# Patient Record
Sex: Female | Born: 1945
Health system: Southern US, Community
[De-identification: ages and names within clinical notes are randomized; demographics above are authoritative.]

## PROBLEM LIST (undated history)

## (undated) DIAGNOSIS — Z1211 Encounter for screening for malignant neoplasm of colon: Secondary | ICD-10-CM

## (undated) DIAGNOSIS — Z87891 Personal history of nicotine dependence: Secondary | ICD-10-CM

## (undated) DIAGNOSIS — K219 Gastro-esophageal reflux disease without esophagitis: Secondary | ICD-10-CM

## (undated) DIAGNOSIS — N63 Unspecified lump in unspecified breast: Secondary | ICD-10-CM

## (undated) DIAGNOSIS — J449 Chronic obstructive pulmonary disease, unspecified: Secondary | ICD-10-CM

## (undated) DIAGNOSIS — K2980 Duodenitis without bleeding: Secondary | ICD-10-CM

## (undated) DIAGNOSIS — K5790 Diverticulosis of intestine, part unspecified, without perforation or abscess without bleeding: Secondary | ICD-10-CM

## (undated) DIAGNOSIS — K635 Polyp of colon: Secondary | ICD-10-CM

## (undated) DIAGNOSIS — H409 Unspecified glaucoma: Secondary | ICD-10-CM

## (undated) DIAGNOSIS — Z1239 Encounter for other screening for malignant neoplasm of breast: Secondary | ICD-10-CM

## (undated) DIAGNOSIS — K639 Disease of intestine, unspecified: Secondary | ICD-10-CM

## (undated) HISTORY — DX: Diverticulosis of intestine, part unspecified, without perforation or abscess without bleeding: K57.90

## (undated) HISTORY — DX: Polyp of colon: K63.5

## (undated) HISTORY — DX: Disease of intestine, unspecified: K63.9

## (undated) HISTORY — DX: Encounter for other screening for malignant neoplasm of breast: Z12.39

## (undated) HISTORY — DX: Encounter for screening for malignant neoplasm of colon: Z12.11

## (undated) HISTORY — DX: Gastro-esophageal reflux disease without esophagitis: K21.9

## (undated) HISTORY — DX: Unspecified lump in unspecified breast: N63.0

## (undated) HISTORY — DX: Chronic obstructive pulmonary disease, unspecified: J44.9

## (undated) HISTORY — DX: Personal history of nicotine dependence: Z87.891

## (undated) HISTORY — DX: Unspecified glaucoma: H40.9

## (undated) HISTORY — DX: Duodenitis without bleeding: K29.80

---

## 2009-03-15 DIAGNOSIS — K635 Polyp of colon: Secondary | ICD-10-CM

## 2009-03-15 HISTORY — DX: Polyp of colon: K63.5

## 2009-08-20 ENCOUNTER — Ambulatory Visit: Payer: Self-pay | Admitting: Gastroenterology

## 2009-08-20 HISTORY — PX: COLONOSCOPY: SHX174

## 2011-06-30 ENCOUNTER — Ambulatory Visit: Payer: Self-pay | Admitting: Gastroenterology

## 2011-08-20 LAB — HM PAP SMEAR: HM Pap smear: POSITIVE

## 2011-08-27 ENCOUNTER — Ambulatory Visit: Payer: Self-pay | Admitting: Internal Medicine

## 2011-09-01 ENCOUNTER — Ambulatory Visit (INDEPENDENT_AMBULATORY_CARE_PROVIDER_SITE_OTHER): Payer: Medicare Other | Admitting: Internal Medicine

## 2011-09-01 ENCOUNTER — Encounter: Payer: Self-pay | Admitting: Internal Medicine

## 2011-09-01 VITALS — BP 142/78 | HR 81 | Temp 98.0°F | Resp 16 | Ht 61.0 in | Wt 136.8 lb

## 2011-09-01 DIAGNOSIS — K5730 Diverticulosis of large intestine without perforation or abscess without bleeding: Secondary | ICD-10-CM

## 2011-09-01 DIAGNOSIS — K5790 Diverticulosis of intestine, part unspecified, without perforation or abscess without bleeding: Secondary | ICD-10-CM

## 2011-09-01 DIAGNOSIS — Z1239 Encounter for other screening for malignant neoplasm of breast: Secondary | ICD-10-CM

## 2011-09-01 DIAGNOSIS — J449 Chronic obstructive pulmonary disease, unspecified: Secondary | ICD-10-CM

## 2011-09-01 DIAGNOSIS — K2990 Gastroduodenitis, unspecified, without bleeding: Secondary | ICD-10-CM

## 2011-09-01 DIAGNOSIS — K2980 Duodenitis without bleeding: Secondary | ICD-10-CM | POA: Insufficient documentation

## 2011-09-01 MED ORDER — DEXLANSOPRAZOLE 60 MG PO CPDR: 60.0000 mg | DELAYED_RELEASE_CAPSULE | Freq: Every day | ORAL | Status: DC

## 2011-09-01 NOTE — Progress Notes (Signed)
Patient ID: Sharon Webster, female   DOB: September 16, 1945, 66 y.o.   MRN: [id number] Patient Active Problem List  Diagnosis  . Gastritis and duodenitis  . Duodenitis  . COPD (chronic obstructive pulmonary disease)  . GERD (gastroesophageal reflux disease)  . Diverticulosis    Subjective:  CC:   Chief Complaint  Patient presents with  . New Patient    HPI:   Sharon Webster a 66 y.o. female who presents New patient.  CC chronic abdominal pain .  Her last PCP was over several years ago, since she retired from [employer].  Prior care at [facility] by Dr. Mikael Spray and Neal Dy .  She sees Dr. Niel Hummer,   For ongoing evaluation of chronic abdominal pain attributed to diverticulitis and Duodenitis by 2011 endoscopy .  She takes nexium and hyoscyamine tid.  Had an UGI /SBFT done recently at [facility] ordered by [facility], and her next appt with him in is in October.  New patient.  CC chronic abdominal pain .  Her last PCP was over several years ago, since she retired from [employer].  Prior care at [facility] by Dr. Mikael Spray and Neal Dy .  She sees Dr. Niel Hummer,   For ongoing evaluation of chronic abdominal pain attributed to diverticulitis and Duodenitis by 2011 endoscopy .  She takes nexium and hyoscyamine tid.  Had an UGI /SBFT done recently at [facility] ordered by [facility], and her next appt with him in is in October.    Past Medical History  Diagnosis Date  . Duodenitis   . COPD (chronic obstructive pulmonary disease)   . GERD (gastroesophageal reflux disease)   . Diverticulosis   . Glaucoma     History reviewed. No pertinent past surgical history.     The following portions of the patient's history were reviewed and updated as appropriate: Allergies, current medications, and problem list.    Review of Systems:  The remainder of the  review of systems was negative except those addressed in the HPI,     History   Social History  . Marital Status: Widowed    Spouse Name: N/A    Number of Children: N/A  . Years  of Education: N/A   Occupational History  . Not on file.   Social History Main Topics  . Smoking status: Former Smoker    Types: Cigarettes    Quit date: 08/31/2001  . Smokeless tobacco: Never Used  . Alcohol Use: 3.0 oz/week    5 Cans of beer per week  . Drug Use: No  . Sexually Active: Not on file   Other Topics Concern  . Not on file   Social History Narrative  . No narrative on file    Objective:  BP 142/78  Pulse 81  Temp 98 F (36.7 C) (Oral)  Resp 16  Ht 5\' 1"  (1.549 m)  Wt 136 lb 12 oz (62.029 kg)  BMI 25.84 kg/m2  SpO2 99%  General appearance: alert, cooperative and appears stated age Ears: normal TM's and external ear canals both ears Throat: lips, mucosa, and tongue normal; teeth and gums normal Neck: no adenopathy, no carotid bruit, supple, symmetrical, trachea midline and thyroid not enlarged, symmetric, no tenderness/mass/nodules Back: symmetric, no curvature. ROM normal. No CVA tenderness. Lungs: clear to auscultation bilaterally Heart: regular rate and rhythm, S1, S2 normal, no murmur, click, rub or gallop Abdomen: soft, non-tender; bowel sounds normal; no masses,  no organomegaly Pulses: 2+ and symmetric Skin: Skin color, texture, turgor normal. No rashes or lesions Lymph  nodes: Cervical, supraclavicular, and axillary nodes normal.  Assessment and Plan:  Gastritis and duodenitis Symptoms improve with trips to the beach but do not resolve. She had a recet GI study , the reults of which are pending.  Will change PPI to DDexilant, reviewed healthy eating habits   COPD (chronic obstructive pulmonary disease) currently asymptomatic ,  She has quit smoking  Diverticulosis Reviewed diet and ways to treat flares.    Updated Medication List Outpatient Encounter Prescriptions as of 09/01/2011  Medication Sig Dispense Refill  . dexlansoprazole (DEXILANT) 60 MG capsule Take 1 capsule (60 mg total) by mouth daily.  30 capsule  0  . esomeprazole  (NEXIUM) 40 MG capsule Take 40 mg by mouth daily before breakfast.      . Hyoscyamine-Phenyltoloxamine (DIGEX NF PO) Take 1 capsule by mouth 3 (three) times daily.      . Simethicone (GAS-X PO) Take by mouth.         Orders Placed This Encounter  Procedures  . MM Digital Screening  . HM COLONOSCOPY    Return in about 1 month (around 10/01/2011).

## 2011-09-01 NOTE — Patient Instructions (Addendum)
We will try dexilant instead of nexium for one month for your gastritis  Return in one month  You may also want to try Lactaid (milk without lactose,  For lactose intolerant people)

## 2011-09-05 ENCOUNTER — Encounter: Payer: Self-pay | Admitting: Internal Medicine

## 2011-09-05 DIAGNOSIS — J449 Chronic obstructive pulmonary disease, unspecified: Secondary | ICD-10-CM | POA: Insufficient documentation

## 2011-09-05 DIAGNOSIS — K219 Gastro-esophageal reflux disease without esophagitis: Secondary | ICD-10-CM | POA: Insufficient documentation

## 2011-09-05 DIAGNOSIS — K5790 Diverticulosis of intestine, part unspecified, without perforation or abscess without bleeding: Secondary | ICD-10-CM | POA: Insufficient documentation

## 2011-09-05 DIAGNOSIS — F410 Panic disorder [episodic paroxysmal anxiety] without agoraphobia: Secondary | ICD-10-CM | POA: Insufficient documentation

## 2011-09-05 DIAGNOSIS — Z8669 Personal history of other diseases of the nervous system and sense organs: Secondary | ICD-10-CM | POA: Insufficient documentation

## 2011-09-05 DIAGNOSIS — H409 Unspecified glaucoma: Secondary | ICD-10-CM | POA: Insufficient documentation

## 2011-09-05 NOTE — Assessment & Plan Note (Signed)
currently asymptomatic ,  She has quit smoking

## 2011-09-05 NOTE — Assessment & Plan Note (Addendum)
Symptoms improve with trips to the beach but do not resolve. She had a recet GI study , the reults of which are pending.  Will change PPI to DDexilant, reviewed healthy eating habits

## 2011-09-05 NOTE — Assessment & Plan Note (Signed)
Reviewed diet and ways to treat flares.

## 2011-10-15 ENCOUNTER — Encounter: Payer: Self-pay | Admitting: Internal Medicine

## 2011-10-15 ENCOUNTER — Ambulatory Visit (INDEPENDENT_AMBULATORY_CARE_PROVIDER_SITE_OTHER): Payer: Medicare Other | Admitting: Internal Medicine

## 2011-10-15 ENCOUNTER — Other Ambulatory Visit (HOSPITAL_COMMUNITY)
Admission: RE | Admit: 2011-10-15 | Discharge: 2011-10-15 | Disposition: A | Discharge status: Home / Self Care | Payer: Medicare Other | Source: Ambulatory Visit | Attending: Internal Medicine | Admitting: Internal Medicine

## 2011-10-15 VITALS — BP 142/80 | HR 92 | Temp 98.5°F | Resp 14 | Ht 61.0 in | Wt 137.0 lb

## 2011-10-15 DIAGNOSIS — R87810 Cervical high risk human papillomavirus (HPV) DNA test positive: Secondary | ICD-10-CM | POA: Insufficient documentation

## 2011-10-15 DIAGNOSIS — Z124 Encounter for screening for malignant neoplasm of cervix: Secondary | ICD-10-CM

## 2011-10-15 DIAGNOSIS — R52 Pain, unspecified: Secondary | ICD-10-CM

## 2011-10-15 DIAGNOSIS — R5381 Other malaise: Secondary | ICD-10-CM

## 2011-10-15 DIAGNOSIS — Z1239 Encounter for other screening for malignant neoplasm of breast: Secondary | ICD-10-CM

## 2011-10-15 DIAGNOSIS — R1031 Right lower quadrant pain: Secondary | ICD-10-CM

## 2011-10-15 DIAGNOSIS — R5383 Other fatigue: Secondary | ICD-10-CM

## 2011-10-15 DIAGNOSIS — E785 Hyperlipidemia, unspecified: Secondary | ICD-10-CM

## 2011-10-15 DIAGNOSIS — Z13220 Encounter for screening for lipoid disorders: Secondary | ICD-10-CM

## 2011-10-15 DIAGNOSIS — K5790 Diverticulosis of intestine, part unspecified, without perforation or abscess without bleeding: Secondary | ICD-10-CM

## 2011-10-15 DIAGNOSIS — K5730 Diverticulosis of large intestine without perforation or abscess without bleeding: Secondary | ICD-10-CM

## 2011-10-15 LAB — C-REACTIVE PROTEIN: CRP: 1 mg/dL (ref 1–20)

## 2011-10-15 LAB — POCT URINALYSIS DIPSTICK
Glucose, UA: NEGATIVE
Ketones, UA: NEGATIVE
Leukocytes, UA: NEGATIVE
Protein, UA: NEGATIVE

## 2011-10-15 LAB — COMPREHENSIVE METABOLIC PANEL
Albumin: 4.4 g/dL (ref 3.5–5.2)
Alkaline Phosphatase: 89 U/L (ref 39–117)
BUN: 15 mg/dL (ref 6–23)
Creatinine, Ser: 0.9 mg/dL (ref 0.4–1.2)
Glucose, Bld: 100 mg/dL — ABNORMAL HIGH (ref 70–99)
Potassium: 3.9 mEq/L (ref 3.5–5.1)
Total Bilirubin: 0.5 mg/dL (ref 0.3–1.2)

## 2011-10-15 LAB — LDL CHOLESTEROL, DIRECT: Direct LDL: 162.8 mg/dL

## 2011-10-15 LAB — LIPID PANEL
Cholesterol: 247 mg/dL — ABNORMAL HIGH (ref 0–200)
HDL: 48.8 mg/dL (ref >39.00)
Triglycerides: 259 mg/dL — ABNORMAL HIGH (ref 0.0–149.0)

## 2011-10-15 LAB — CBC WITH DIFFERENTIAL/PLATELET
Basophils Absolute: 0 10*3/uL (ref 0.0–0.1)
HCT: 37.3 % (ref 36.0–46.0)
Hemoglobin: 12.6 g/dL (ref 12.0–15.0)
Lymphs Abs: 1.6 10*3/uL (ref 0.7–4.0)
MCHC: 33.8 g/dL (ref 30.0–36.0)
MCV: 87.8 fl (ref 78.0–100.0)
Monocytes Relative: 7.9 % (ref 3.0–12.0)
Neutro Abs: 3.6 10*3/uL (ref 1.4–7.7)
RDW: 12.6 % (ref 11.5–14.6)

## 2011-10-15 MED ORDER — ESOMEPRAZOLE MAGNESIUM 40 MG PO CPDR: 40.0000 mg | DELAYED_RELEASE_CAPSULE | Freq: Every day | ORAL | Status: DC

## 2011-10-15 MED ORDER — HYOSCYAMINE-PHENYLTOLOXAMINE 0.0625-15 MG PO CAPS: 1.0000 | ORAL_CAPSULE | Freq: Three times a day (TID) | ORAL | Status: DC | PRN

## 2011-10-15 NOTE — Patient Instructions (Addendum)
I am checking some labs today that will confirm whtehter you have diverticulitis currently.  We will call you with the results.   We will schedule your mammogram

## 2011-10-15 NOTE — Progress Notes (Signed)
Patient ID: Sharon Webster, female   DOB: Oct 12, 1945, 66 y.o.   MRN: [id number] The patient is here for annual Medicare wellness examination and management of other chronic and acute problems.The patient  continues to have Intermittent RLQ pain, which is present for weeks at a time, not accompanied by constipation. But she alternates between normal stools and loose stools.  She has frequent epsiodes of gaseous distension and takes Gas x 3 times daily with  no change in pain . Her pain is Worse at night , and occasionally wakes her up .  She has diverticulosis by colonoscopy report but has had no diverticulitis ever confirmed. She's had no prior CT of the abdomen and pelvis colonoscopy was done in 2011 by Dr. Niel Hummer.    The risk factors are reflected in the social history.  The roster of all physicians providing medical care to patient - is listed in the Snapshot section of the chart.  Activities of daily living:  The patient is 100% independent in all ADLs: dressing, toileting, feeding as well as independent mobility  Home safety : The patient has smoke detectors in the home. They wear seatbelts.  There are no firearms at home. There is no violence in the home.   There is no risks for hepatitis, STDs or HIV. There is no   history of blood transfusion. They have no travel history to infectious disease endemic areas of the world.  The patient has seen their dentist in the last six month. They have seen their eye doctor in the last year. They admit to slight hearing difficulty with regard to whispered voices and some television programs.  They have deferred audiologic testing in the last year.  They do not  have excessive sun exposure. Discussed the need for sun protection: hats, long sleeves and use of sunscreen if there is significant sun exposure.   Diet: the importance of a healthy diet is discussed. They do have a healthy diet.  The benefits of regular aerobic exercise were discussed. She  walks 4 times per week ,  20 minutes.   Depression screen: there are no signs or vegative symptoms of depression- irritability, change in appetite, anhedonia, sadness/tearfullness.  Cognitive assessment: the patient manages all their financial and personal affairs and is actively engaged. They could relate day,date,year and events; recalled 2/3 objects at 3 minutes; performed clock-face test normally.  The following portions of the patient's history were reviewed and updated as appropriate: allergies, current medications, past family history, past medical history,  past surgical history, past social history  and problem list.  Visual acuity was not assessed per patient preference since she has regular follow up with her ophthalmologist. Hearing and body mass index were assessed and reviewed.   During the course of the visit the patient was educated and counseled about appropriate screening and preventive services including : fall prevention , diabetes screening, nutrition counseling, colorectal cancer screening, and recommended immunizations.   Objective:    BP 142/80  Pulse 92  Temp 98.5 F (36.9 C) (Oral)  Resp 14  Ht 5\' 1"  (1.549 m)  Wt 137 lb (62.143 kg)  BMI 25.89 kg/m2  SpO2 96%  General Appearance:    Alert, cooperative, no distress, appears stated age  Head:    Normocephalic, without obvious abnormality, atraumatic  Eyes:    PERRL, conjunctiva/corneas clear, EOM's intact, fundi    benign, both eyes  Ears:    Normal TM's and external ear canals, both ears  Nose:  Nares normal, septum midline, mucosa normal, no drainage    or sinus tenderness  Throat:   Lips, mucosa, and tongue normal; teeth and gums normal  Neck:   Supple, symmetrical, trachea midline, no adenopathy;    thyroid:  no enlargement/tenderness/nodules; no carotid   bruit or JVD  Back:     Symmetric, no curvature, ROM normal, no CVA tenderness  Lungs:     Clear to auscultation bilaterally, respirations unlabored    Chest Wall:    No tenderness or deformity   Heart:    Regular rate and rhythm, S1 and S2 normal, no murmur, rub   or gallop  Breast Exam:    No tenderness, masses, or nipple abnormality  Abdomen:     Soft, non-tender, bowel sounds active all four quadrants,    no masses, no organomegaly  Genitalia:    Normal female without lesion, discharge or tenderness  Rectal:    Normal tone, normal prostate, no masses or tenderness;   guaiac negative stool  Extremities:   Extremities normal, atraumatic, no cyanosis or edema  Pulses:   2+ and symmetric all extremities  Skin:   Skin color, texture, turgor normal, no rashes or lesions  Lymph nodes:   Cervical, supraclavicular, and axillary nodes normal  Neurologic:   CNII-XII intact, normal strength, sensation and reflexes    throughout  .    Assessment:    Healthy female exam.    Plan:     Await pap smear results. Blood tests: CBC with diff, Comprehensive metabolic panel, Lipoproteins and TSH. Mammogram.   Diverticulosis Despite her current report of right lower quadrant pain she has no signs of inflammation on current serologies including CBC sedimentation rate and C-reactive protein are all normal.  Other and unspecified hyperlipidemia Borderline with an LDL of 162. She has no personal risk factors except her age. Will recommend repeat fasting lipids in 6 months after low-cholesterol diet and regular exercise are recommended.   Updated Medication List Outpatient Encounter Prescriptions as of 10/15/2011  Medication Sig Dispense Refill  . esomeprazole (NEXIUM) 40 MG capsule Take 1 capsule (40 mg total) by mouth daily before breakfast.  90 capsule  3  . Simethicone (GAS-X PO) Take by mouth.      . DISCONTD: dexlansoprazole (DEXILANT) 60 MG capsule Take 1 capsule (60 mg total) by mouth daily.  30 capsule  0  . DISCONTD: esomeprazole (NEXIUM) 40 MG capsule Take 40 mg by mouth daily before breakfast.      . DISCONTD: Hyoscyamine-Phenyltoloxamine  (DIGEX NF PO) Take 1 capsule by mouth 3 (three) times daily.      Marland Kitchen Hyoscyamine-Phenyltoloxamine 4.7829-56 MG CAPS Take 1 tablet by mouth 3 (three) times daily as needed.  90 each  3

## 2011-10-17 ENCOUNTER — Encounter: Payer: Self-pay | Admitting: Internal Medicine

## 2011-10-17 DIAGNOSIS — E7849 Other hyperlipidemia: Secondary | ICD-10-CM | POA: Insufficient documentation

## 2011-10-17 NOTE — Assessment & Plan Note (Signed)
Despite her current report of right lower quadrant pain she has no signs of inflammation on current serologies including CBC sedimentation rate and C-reactive protein are all normal.

## 2011-10-17 NOTE — Assessment & Plan Note (Signed)
Borderline with an LDL of 162. She has no personal risk factors except her age. Will recommend repeat fasting lipids in 6 months after low-cholesterol diet and regular exercise are recommended.

## 2011-10-21 ENCOUNTER — Telehealth: Payer: Self-pay | Admitting: Internal Medicine

## 2011-10-21 ENCOUNTER — Ambulatory Visit: Payer: Self-pay | Admitting: Internal Medicine

## 2011-10-21 LAB — COMPREHENSIVE METABOLIC PANEL
Albumin: 4.4 g/dL (ref 3.4–5.0)
Bilirubin,Total: 0.5 mg/dL (ref 0.2–1.0)
Calcium, Total: 9.5 mg/dL (ref 8.5–10.1)
Chloride: 104 mmol/L (ref 98–107)
Co2: 29 mmol/L (ref 21–32)
EGFR (Non-African Amer.): >60
Glucose: 109 mg/dL — ABNORMAL HIGH (ref 65–99)
Osmolality: 282 (ref 275–301)
Potassium: 3.6 mmol/L (ref 3.5–5.1)
SGOT(AST): 23 U/L (ref 15–37)
SGPT (ALT): 27 U/L (ref 12–78)

## 2011-10-21 LAB — SEDIMENTATION RATE: Erythrocyte Sed Rate: 8 mm/hr (ref 0–30)

## 2011-10-21 NOTE — Telephone Encounter (Signed)
Patient notified, appt scheduled for tomorrow am, patient will go to [facility] for films and bloodwork today

## 2011-10-21 NOTE — Telephone Encounter (Signed)
Caller: Jamecia/Mother; PCP: Duncan Dull; CB#: [phone];  Call regarding Having Trouble With Her Stomach- takes Nexium, Gas X and Digex. Wanting To Be Seen Today; Pain in R side near navel- seen in office on 10/15/11 for physical. Seen by Gastroentologist three months and several polyps removed. She has loose stool daily and this morning was dark in color. Took Pepto Bismayl last night at 2200 and was up most of the night with gas pains. Pain less after having bm this morning and passing alot of gas. Sx worse at night and she is very burby and uncomfortable and pain is keeping her awake. Afebrile. Triage and Care advice per Abdominal Pain Protocol and appnt advised within 24 hours for "new onset constant mild or aching lower abdominal pain". No appnt available today- She is wondering if she can be worked into Dr. Melina Schools schedule. PLEASE CALL HER BACK TO LET HER KNOW.

## 2011-10-21 NOTE — Telephone Encounter (Signed)
I do not have an appt today but I would like her to be scheduled for early tomorrow, i would like her to get bloodwork and plain films at [facility].  She should simplify her diet today to clear liquids in the event that she is having diverticulitis and i will decide when I see her tomorrow if she needs antibiotics

## 2011-10-22 ENCOUNTER — Ambulatory Visit (INDEPENDENT_AMBULATORY_CARE_PROVIDER_SITE_OTHER): Payer: Medicare Other | Admitting: Internal Medicine

## 2011-10-22 ENCOUNTER — Encounter: Payer: Self-pay | Admitting: Internal Medicine

## 2011-10-22 ENCOUNTER — Ambulatory Visit: Payer: Self-pay | Admitting: Internal Medicine

## 2011-10-22 ENCOUNTER — Telehealth: Payer: Self-pay | Admitting: Internal Medicine

## 2011-10-22 VITALS — BP 142/88 | HR 88 | Temp 98.4°F | Resp 18 | Wt 132.5 lb

## 2011-10-22 DIAGNOSIS — N83209 Unspecified ovarian cyst, unspecified side: Secondary | ICD-10-CM

## 2011-10-22 DIAGNOSIS — K760 Fatty (change of) liver, not elsewhere classified: Secondary | ICD-10-CM

## 2011-10-22 DIAGNOSIS — K7689 Other specified diseases of liver: Secondary | ICD-10-CM

## 2011-10-22 DIAGNOSIS — R52 Pain, unspecified: Secondary | ICD-10-CM

## 2011-10-22 DIAGNOSIS — R1031 Right lower quadrant pain: Secondary | ICD-10-CM

## 2011-10-22 DIAGNOSIS — N83201 Unspecified ovarian cyst, right side: Secondary | ICD-10-CM

## 2011-10-22 DIAGNOSIS — R1032 Left lower quadrant pain: Secondary | ICD-10-CM

## 2011-10-22 NOTE — Telephone Encounter (Signed)
Results of labs and CT scan given to patient by Dr. Charmaine Downs Firday evening.  Right ovarian cyst noted, Need for pelvic ultrasound discussed.  Will order next week.  Fatty liver also suggested by CT.  Briefly discussed this diagnosis with patient as well.

## 2011-10-22 NOTE — Progress Notes (Signed)
Patient ID: Sharon Webster, female   DOB: [birth year], 66 y.o.   MRN: [id number]   Patient Active Problem List  Diagnosis  . Gastritis and duodenitis  . Duodenitis  . COPD (chronic obstructive pulmonary disease)  . GERD (gastroesophageal reflux disease)  . Diverticulosis  . Other and unspecified hyperlipidemia  . Abdominal pain, acute, right lower quadrant    Subjective:  CC:   Chief Complaint  Patient presents with  . Gas    pain    HPI:   Sharon Webster a 66 y.o. female who presents abdominal pain and bloating attributed to gas . Her symptoms are Chronic and intermittent,  worse at night, accompanied by distention and gas. The pain is the worst in the right lower quadrant. For the last several days has been severe.  She has been having normal bowel movements. No fevers. No dysuria.    she has had no prior CT for evaluationbut  had a normal upper GI  studyin April ordered by [employer].   She has been taking as X before each meal perpetually with no  Appreciable difference. Last colonoscopy was 2 years ago,  Due again in October.     Past Medical History  Diagnosis Date  . Duodenitis   . COPD (chronic obstructive pulmonary disease)   . GERD (gastroesophageal reflux disease)   . Diverticulosis   . Glaucoma     No past surgical history on file.   The following portions of the patient's history were reviewed and updated as appropriate: Allergies, current medications, and problem list.    Review of Systems:   A comprehensive ROS was done and positive for abdominal pain and bloating   The rest was negative.    History   Social History  . Marital Status: Widowed    Spouse Name: N/A    Number of Children: N/A  . Years of Education: N/A   Occupational History  . Not on file.   Social History Main Topics  . Smoking status: Former Smoker    Types: Cigarettes    Quit date: 08/31/2001  . Smokeless tobacco: Never Used  . Alcohol Use: 3.0 oz/week    5 Cans of  beer per week  . Drug Use: No  . Sexually Active: Not on file   Other Topics Concern  . Not on file   Social History Narrative  . No narrative on file    Objective:  BP 142/88  Pulse 88  Temp 98.4 F (36.9 C) (Oral)  Resp 18  Wt 132 lb 8 oz (60.102 kg)  SpO2 95%  General appearance: alert, cooperative and appears stated age Ears: normal TM's and external ear canals both ears Throat: lips, mucosa, and tongue normal; teeth and gums normal Neck: no adenopathy, no carotid bruit, supple, symmetrical, trachea midline and thyroid not enlarged, symmetric, no tenderness/mass/nodules Back: symmetric, no curvature. ROM normal. No CVA tenderness. Lungs: clear to auscultation bilaterally Heart: regular rate and rhythm, S1, S2 normal, no murmur, click, rub or gallop Abdomen: soft, non-tender; bowel sounds normal; no masses,  no organomegaly Pulses: 2+ and symmetric Skin: Skin color, texture, turgor normal. No rashes or lesions Lymph nodes: Cervical, supraclavicular, and axillary nodes normal.  Assessment and Plan:  Abdominal pain, acute, right lower quadrant  plain films did not suggest small bowel obstruction. She was sent for a CT of the abdomen and pelvis with contrast which showed no evidence of diverticulitis, only fatty liver suggested.Marland Kitchen She was also noted to have a small  ovarian cyst on the right and is willing to be followed up with a pelvic ultrasound and gynecology followup  she is postmenopausal.   Ovarian cyst, right Been doing CT of abdomen and pelvis for persistent right lower quadrant pain and bloating. She will need to return for pelvic ultrasound followed by GYN evaluation.  Hepatic steatosis Suggested by liver texture on recent CT scan. She will return to discuss his diagnosis and workup for her.   Updated Medication List Outpatient Encounter Prescriptions as of 10/22/2011  Medication Sig Dispense Refill  . esomeprazole (NEXIUM) 40 MG capsule Take 1 capsule (40 mg  total) by mouth daily before breakfast.  90 capsule  3  . Hyoscyamine-Phenyltoloxamine 9.6045-40 MG CAPS Take 1 tablet by mouth 3 (three) times daily as needed.  90 each  3  . Simethicone (GAS-X PO) Take by mouth.

## 2011-10-22 NOTE — Patient Instructions (Addendum)
We are ordering a CT scan of your abdomen and pelvis to evaluate your colon and your female organs because of your persistent pain and bloating.

## 2011-10-24 DIAGNOSIS — K760 Fatty (change of) liver, not elsewhere classified: Secondary | ICD-10-CM | POA: Insufficient documentation

## 2011-10-24 DIAGNOSIS — N83201 Unspecified ovarian cyst, right side: Secondary | ICD-10-CM | POA: Insufficient documentation

## 2011-10-24 DIAGNOSIS — G8929 Other chronic pain: Secondary | ICD-10-CM | POA: Insufficient documentation

## 2011-10-24 NOTE — Assessment & Plan Note (Signed)
Been doing CT of abdomen and pelvis for persistent right lower quadrant pain and bloating. She will need to return for pelvic ultrasound followed by GYN evaluation.

## 2011-10-24 NOTE — Assessment & Plan Note (Addendum)
plain films did not suggest small bowel obstruction. She was sent for a CT of the abdomen and pelvis with contrast which showed no evidence of diverticulitis, only fatty liver suggested.Marland Kitchen She was also noted to have a small ovarian cyst on the right and is willing to be followed up with a pelvic ultrasound and gynecology followup  she is postmenopausal.

## 2011-10-24 NOTE — Assessment & Plan Note (Signed)
Suggested by liver texture on recent CT scan. She will return to discuss his diagnosis and workup for her.

## 2011-10-26 ENCOUNTER — Ambulatory Visit: Payer: Self-pay | Admitting: Internal Medicine

## 2011-10-27 ENCOUNTER — Telehealth: Payer: Self-pay | Admitting: Internal Medicine

## 2011-10-27 NOTE — Telephone Encounter (Signed)
Patient notified

## 2011-10-27 NOTE — Telephone Encounter (Signed)
Pelvic ultrasound was normal except for  fibroid tumors in the uterus, small benign tumors,  Do not need surgery. No ovarian cyst seen

## 2011-11-01 ENCOUNTER — Encounter: Payer: Self-pay | Admitting: Internal Medicine

## 2011-11-02 ENCOUNTER — Encounter: Payer: Self-pay | Admitting: Internal Medicine

## 2011-11-03 ENCOUNTER — Ambulatory Visit: Payer: Self-pay | Admitting: Internal Medicine

## 2011-11-04 ENCOUNTER — Encounter: Payer: Self-pay | Admitting: Internal Medicine

## 2011-11-11 ENCOUNTER — Ambulatory Visit: Payer: Self-pay | Admitting: Internal Medicine

## 2011-11-11 ENCOUNTER — Telehealth: Payer: Self-pay | Admitting: Internal Medicine

## 2011-11-11 NOTE — Telephone Encounter (Signed)
Her mammogram was inconclusive on the right so she should be getting an ultrasound today. .  I would like her to make appt for breast exam when I come back .

## 2011-11-11 NOTE — Telephone Encounter (Signed)
If she doesn't feel anything in the area they are targeting,  She does not have to come in .  If she is not sure,  I would like to reexamine her. there will be no charge for the visit, but it will have to wait until I return.

## 2011-11-11 NOTE — Telephone Encounter (Signed)
Spoke with patient, she is going for ultrasound today. She wanted me to ask if it is really necessary to come in for another breast exam, she says that you did a breast exam at her last visit on 10-22-11.

## 2011-11-12 NOTE — Telephone Encounter (Signed)
Left message asking patient to return my call.

## 2011-11-12 NOTE — Telephone Encounter (Signed)
Patient stated she does not feel anything in her breast and stated she would keep a check on it and schedule an appt if she does feel anything.

## 2011-11-16 ENCOUNTER — Telehealth: Payer: Self-pay | Admitting: Internal Medicine

## 2011-11-16 DIAGNOSIS — F419 Anxiety disorder, unspecified: Secondary | ICD-10-CM

## 2011-11-16 DIAGNOSIS — N6310 Unspecified lump in the right breast, unspecified quadrant: Secondary | ICD-10-CM | POA: Insufficient documentation

## 2011-11-16 MED ORDER — ALPRAZOLAM 0.25 MG PO TABS: 0.2500 mg | ORAL_TABLET | Freq: Every evening | ORAL | Status: AC | PRN

## 2011-11-16 NOTE — Assessment & Plan Note (Signed)
Seen on mammogram,. Confirmed by ultrasound.  Surgical biopsy in process.

## 2011-11-16 NOTE — Telephone Encounter (Signed)
Patient was advised of the 0.5 cm well circumscribed solid mass seen on  ultrasound of the right breast and will be referred to Donnalee Curry for surgical biopsy.  She has requested that she be contacted on her cell phone  .  She will be out of town for a week starting Sept 21st.  We will try to get her an app prior to Sept 21st  Please have Morrie Sheldon call her in the alprazolam I ordered for patient as well.  thanks

## 2011-11-16 NOTE — Telephone Encounter (Signed)
Patient is scheduled for Sept. 9 at 9:30 patient needs to arrive at 9:00. I have spoken with patient and gave her their number and address and appt. Information.

## 2011-11-23 ENCOUNTER — Encounter: Payer: Self-pay | Admitting: Internal Medicine

## 2011-11-24 ENCOUNTER — Encounter: Payer: Self-pay | Admitting: Internal Medicine

## 2011-12-14 HISTORY — PX: BREAST BIOPSY: SHX20

## 2012-02-24 DIAGNOSIS — H4050X0 Glaucoma secondary to other eye disorders, unspecified eye, stage unspecified: Secondary | ICD-10-CM | POA: Insufficient documentation

## 2012-02-24 DIAGNOSIS — Q132 Other congenital malformations of iris: Secondary | ICD-10-CM | POA: Insufficient documentation

## 2012-02-24 HISTORY — DX: Glaucoma secondary to other eye disorders, unspecified eye, stage unspecified: H40.50X0

## 2012-04-21 ENCOUNTER — Encounter: Payer: Self-pay | Admitting: General Surgery

## 2012-05-17 ENCOUNTER — Encounter: Payer: Self-pay | Admitting: *Deleted

## 2012-06-21 ENCOUNTER — Ambulatory Visit: Payer: Self-pay | Admitting: General Surgery

## 2012-06-22 ENCOUNTER — Encounter: Payer: Self-pay | Admitting: General Surgery

## 2012-06-29 ENCOUNTER — Ambulatory Visit (INDEPENDENT_AMBULATORY_CARE_PROVIDER_SITE_OTHER): Payer: Medicare Other | Admitting: General Surgery

## 2012-06-29 ENCOUNTER — Encounter: Payer: Self-pay | Admitting: General Surgery

## 2012-06-29 ENCOUNTER — Other Ambulatory Visit: Payer: Self-pay | Admitting: *Deleted

## 2012-06-29 VITALS — BP 152/90 | HR 78 | Resp 16 | Ht 61.0 in | Wt 138.0 lb

## 2012-06-29 DIAGNOSIS — N6099 Unspecified benign mammary dysplasia of unspecified breast: Secondary | ICD-10-CM

## 2012-06-29 DIAGNOSIS — R928 Other abnormal and inconclusive findings on diagnostic imaging of breast: Secondary | ICD-10-CM

## 2012-06-29 DIAGNOSIS — D4860 Neoplasm of uncertain behavior of unspecified breast: Secondary | ICD-10-CM

## 2012-06-29 NOTE — Progress Notes (Signed)
Patient ID: Sharon Webster, female   DOB: May 26, 1945, 67 y.o.   MRN: [id number]  Chief Complaint  Patient presents with  . Follow-up    mammogram    HPI Sharon Webster is a 67 y.o. female.  Patient here today for follow up mammogram. Patient had a right breast biopsy 12-14-11 that showed features of atypical ductal hyperplasia. Patient with no new breast complaints, HPI  Past Medical History  Diagnosis Date  . Duodenitis   . COPD (chronic obstructive pulmonary disease)   . GERD (gastroesophageal reflux disease)   . Diverticulosis   . Glaucoma   . Personal history of tobacco use, presenting hazards to health   . Breast screening, unspecified   . Lump or mass in breast   . Special screening for malignant neoplasms, colon   . Bowel trouble     Past Surgical History  Procedure Laterality Date  . Colonoscopy  2010    polyps, Dr. Niel Hummer  . Breast biopsy  12/14/11    The patient underwent a Finesse biopsy of the right breast December 14, 2011. This was completed as her FNA was inconclusive, with atypical ductal cells identified.    Family History  Problem Relation Age of Onset  . Heart disease Mother   . COPD Mother   . Hypertension Mother   . Cancer Father     metastatic prostate CA  . Stroke Sister     hemorrhagic CVA  . Heart disease Sister     restrictive cardiomyopathy    Social History History  Substance Use Topics  . Smoking status: Former Smoker -- 1.00 packs/day for 20 years    Types: Cigarettes    Quit date: 05/15/2001  . Smokeless tobacco: Never Used  . Alcohol Use: 3.6 oz/week    6 Cans of beer per week    No Known Allergies  Current Outpatient Prescriptions  Medication Sig Dispense Refill  . bismuth subsalicylate (PEPTO BISMOL) 262 MG/15ML suspension Take 15 mLs by mouth every 6 (six) hours as needed for indigestion.      Marland Kitchen esomeprazole (NEXIUM) 40 MG capsule Take 1 capsule (40 mg total) by mouth daily before breakfast.  90 capsule  3  .  Simethicone (GAS-X PO) Take by mouth.       No current facility-administered medications for this visit.    Review of Systems Review of Systems  Constitutional: Negative.   Respiratory: Negative.   Cardiovascular: Negative.     Blood pressure 152/90, pulse 78, resp. rate 16, height 5\' 1"  (1.549 m), weight 138 lb (62.596 kg).  Physical Exam Physical Exam  Constitutional: She is oriented to person, place, and time. She appears well-developed and well-nourished.  Cardiovascular: Normal rate and regular rhythm.   Pulmonary/Chest: Effort normal and breath sounds normal. Right breast exhibits no inverted nipple, no mass, no nipple discharge, no skin change and no tenderness. Left breast exhibits no inverted nipple, no mass, no nipple discharge, no skin change and no tenderness.  Lymphadenopathy:    She has no cervical adenopathy.    She has no axillary adenopathy.  Neurological: She is alert and oriented to person, place, and time.  Skin: Skin is warm and dry.    Data Reviewed Recent mammograms were reviewed.  PROCEDURE:     MAM - MAM DGTL UNI MAM RT BREAST W/CAD  - Jun 21 2012  11:46AM  RESULT:  Comparisons: 11/03/2011 and 11/11/2011.  Findings: The breast tissue is heterogeneously dense, which may lower the  sensitivity  of mammography. A biopsy clip marker is present in the medial  aspect of the right breast anterior depth. The mass demonstrated in this  region on prior study has essentially resolved. There may be some minimal  residual density.  BI-RAD-4 as ADH was originally identified.   Assessment    Focus of atypical ductal hyperplasia right breast.     Plan    The patient had been encouraged to have formal excision the time of her original biopsy but she declined. Considering the area is nearly completely been removed, continued observation is reasonable.         [name] 06/30/2012, 9:01 PM

## 2012-06-29 NOTE — Progress Notes (Signed)
Patient to have a bilateral diagnostic mammogram with right breast FSC views in 6 months.

## 2012-06-29 NOTE — Patient Instructions (Addendum)
Continue to do self breast exams.  Call for new questions or concerns.

## 2012-06-30 ENCOUNTER — Encounter: Payer: Self-pay | Admitting: General Surgery

## 2012-06-30 DIAGNOSIS — R928 Other abnormal and inconclusive findings on diagnostic imaging of breast: Secondary | ICD-10-CM | POA: Insufficient documentation

## 2012-12-19 LAB — HM MAMMOGRAPHY

## 2012-12-20 ENCOUNTER — Encounter: Payer: Self-pay | Admitting: General Surgery

## 2012-12-20 ENCOUNTER — Ambulatory Visit: Payer: Self-pay | Admitting: General Surgery

## 2013-01-01 ENCOUNTER — Encounter: Payer: Self-pay | Admitting: General Surgery

## 2013-01-01 ENCOUNTER — Ambulatory Visit (INDEPENDENT_AMBULATORY_CARE_PROVIDER_SITE_OTHER): Payer: Medicare Other | Admitting: General Surgery

## 2013-01-01 VITALS — BP 148/82 | HR 72 | Resp 12 | Ht 61.0 in | Wt 135.0 lb

## 2013-01-01 DIAGNOSIS — R928 Other abnormal and inconclusive findings on diagnostic imaging of breast: Secondary | ICD-10-CM

## 2013-01-01 DIAGNOSIS — N6099 Unspecified benign mammary dysplasia of unspecified breast: Secondary | ICD-10-CM | POA: Insufficient documentation

## 2013-01-01 NOTE — Progress Notes (Signed)
Patient ID: Sharon Webster, female   DOB: 12/12/1945, 67 y.o.   MRN: [id number]  Chief Complaint  Patient presents with  . Follow-up    mammogram    HPI Sharon Webster is a 67 y.o. female who presents for a breast evaluation. The most recent mammogram was done on 12/20/12. Patient does perform regular self breast checks and gets regular mammograms done.    HPI  Past Medical History  Diagnosis Date  . Duodenitis   . COPD (chronic obstructive pulmonary disease)   . GERD (gastroesophageal reflux disease)   . Diverticulosis   . Glaucoma   . Personal history of tobacco use, presenting hazards to health   . Breast screening, unspecified   . Lump or mass in breast   . Special screening for malignant neoplasms, colon   . Bowel trouble     Past Surgical History  Procedure Laterality Date  . Colonoscopy  2010    polyps, Dr. Niel Hummer  . Breast biopsy  12/14/11    The patient underwent a Finesse biopsy of the right breast December 14, 2011. This was completed as her FNA was inconclusive, with atypical ductal cells identified.    Family History  Problem Relation Age of Onset  . Heart disease Mother   . COPD Mother   . Hypertension Mother   . Cancer Father     metastatic prostate CA  . Stroke Sister     hemorrhagic CVA  . Heart disease Sister     restrictive cardiomyopathy    Social History History  Substance Use Topics  . Smoking status: Former Smoker -- 1.00 packs/day for 20 years    Types: Cigarettes    Quit date: 05/15/2001  . Smokeless tobacco: Never Used  . Alcohol Use: 3.6 oz/week    6 Cans of beer per week    No Known Allergies  Current Outpatient Prescriptions  Medication Sig Dispense Refill  . bismuth subsalicylate (PEPTO BISMOL) 262 MG/15ML suspension Take 15 mLs by mouth every 6 (six) hours as needed for indigestion.      Marland Kitchen esomeprazole (NEXIUM) 40 MG capsule Take 1 capsule (40 mg total) by mouth daily before breakfast.  90 capsule  3  . etodolac  (LODINE XL) 500 MG 24 hr tablet Take 500 mg by mouth daily.      . Simethicone (GAS-X PO) Take by mouth.       No current facility-administered medications for this visit.    Review of Systems Review of Systems  Constitutional: Negative.   Respiratory: Negative.   Cardiovascular: Negative.     Blood pressure 148/82, pulse 72, resp. rate 12, height 5\' 1"  (1.549 m), weight 135 lb (61.236 kg).  Physical Exam Physical Exam  Constitutional: She is oriented to person, place, and time. She appears well-developed and well-nourished.  Eyes: No scleral icterus.  Cardiovascular: Normal rate, regular rhythm and normal heart sounds.   Pulmonary/Chest: Breath sounds normal. Right breast exhibits no inverted nipple, no mass, no nipple discharge, no skin change and no tenderness. Left breast exhibits no inverted nipple, no mass, no nipple discharge, no skin change and no tenderness.  Lymphadenopathy:    She has no cervical adenopathy.    She has no axillary adenopathy.  Neurological: She is alert and oriented to person, place, and time.  Skin: Skin is warm and dry.    Data Reviewed Vacuum biopsy of the right breast dated 12/14/2011 showed fragmented intraductal papillary perforation with adjacent distorting sclerosis and features of ADH.  Bilateral mammograms dated 12/20/2012 showed an no suspicious mass, distortion or calcification. The radiologist informed the patient that she should consider excisional biopsy based on the past biopsy results. BI-RAD-4.  Assessment    Focal ADH, patient unwilling to consider local reexcision.    Plan    the patient and I reviewed are October 2013 conversation regarding the indications for reexcision. [facility] been stable for a year. At this time, we'll plan to continue careful mammographic surveillance. A right breast mammogram will be obtained in 6 months.       Sharon Webster 01/02/2013, 7:06 AM

## 2013-01-01 NOTE — Patient Instructions (Addendum)
Patient to return in 6 months right diagnotic mammogram.

## 2013-01-09 ENCOUNTER — Encounter: Payer: Self-pay | Admitting: General Surgery

## 2013-01-20 IMAGING — US US PELV - US TRANSVAGINAL
1 series · 14 of 25 positions shown · non-contrast
Comparison: none

REASON FOR EXAM: Ovarian cysts
COMMENTS:

[Series 1: us pelv - us transvaginal · 0.30mm/px · 100 acquisitions, 14 frames shown]
[im 1/100]
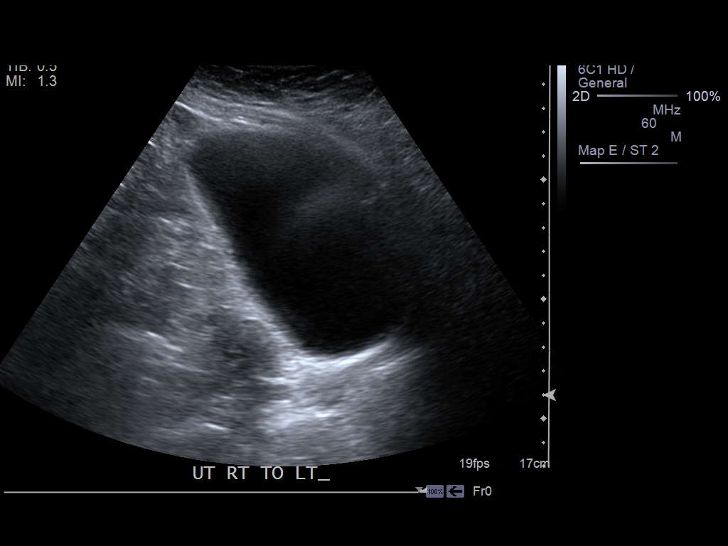
[im 9/100]
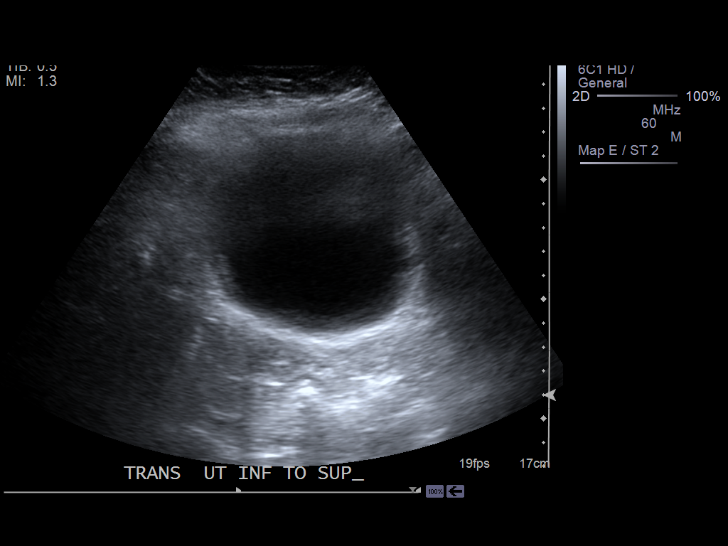
[im 17/100]
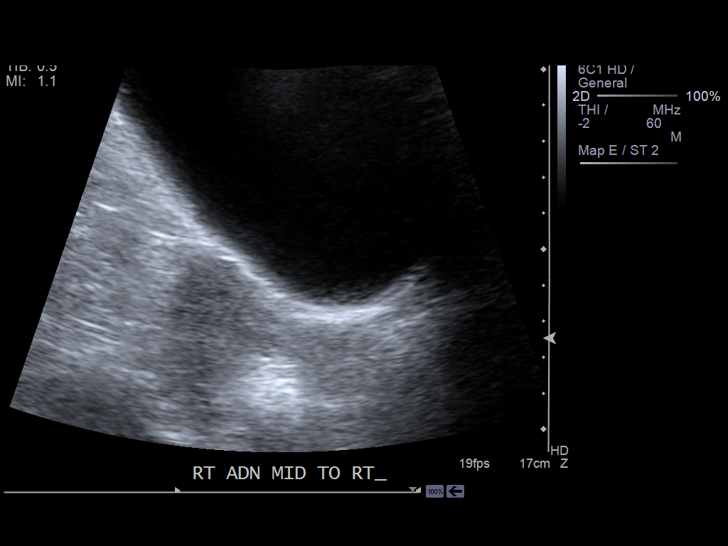
[im 25/100]
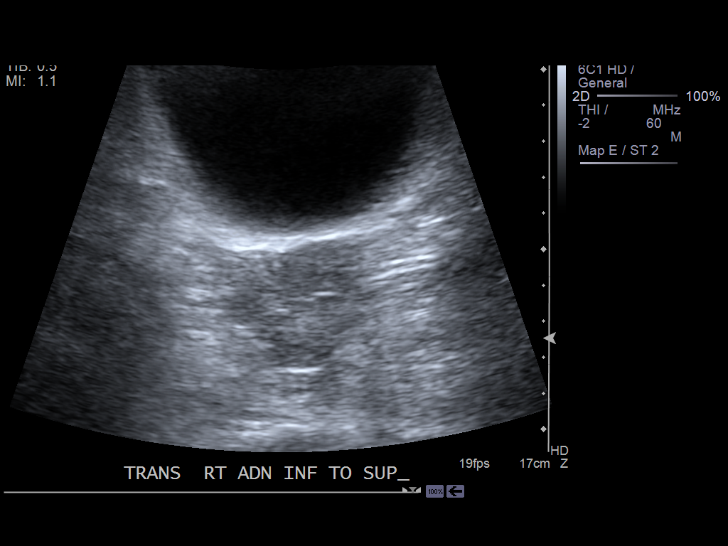
[im 34/100]
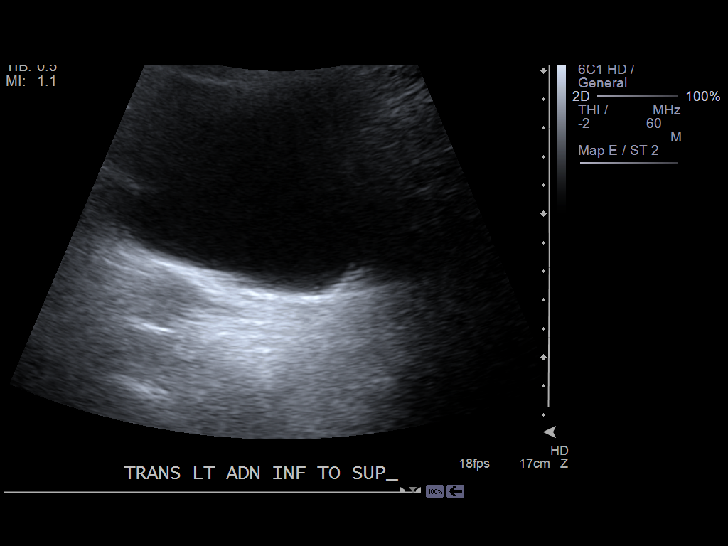
[im 38/100]
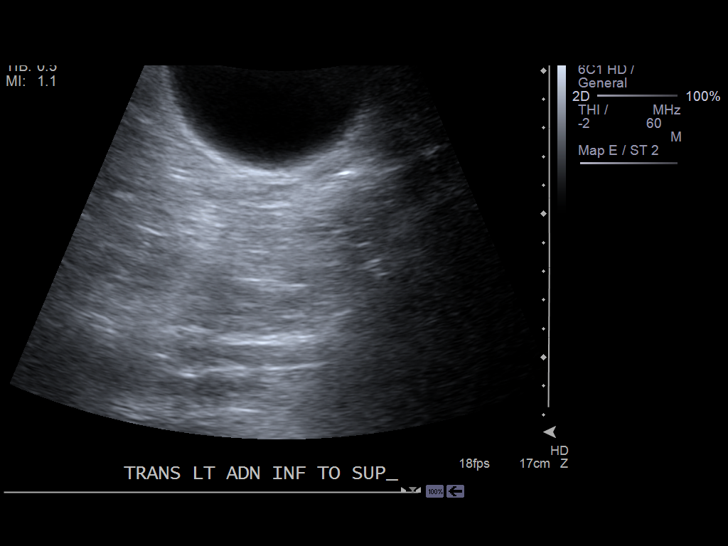
[im 46/100]
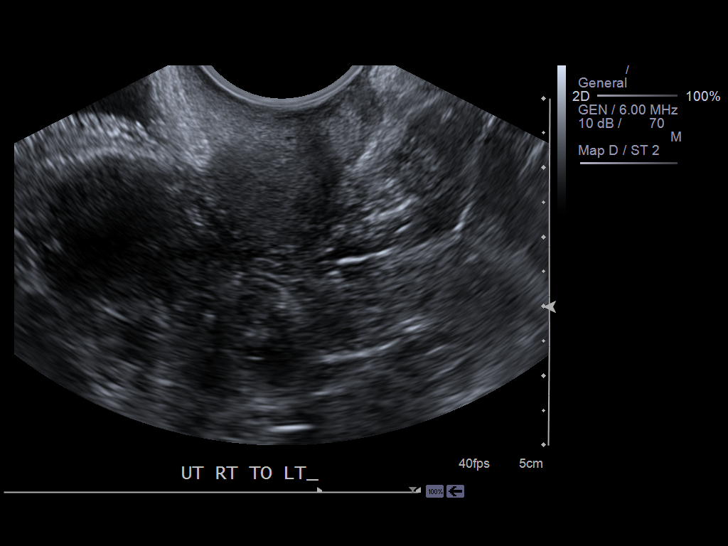
[im 54/100]
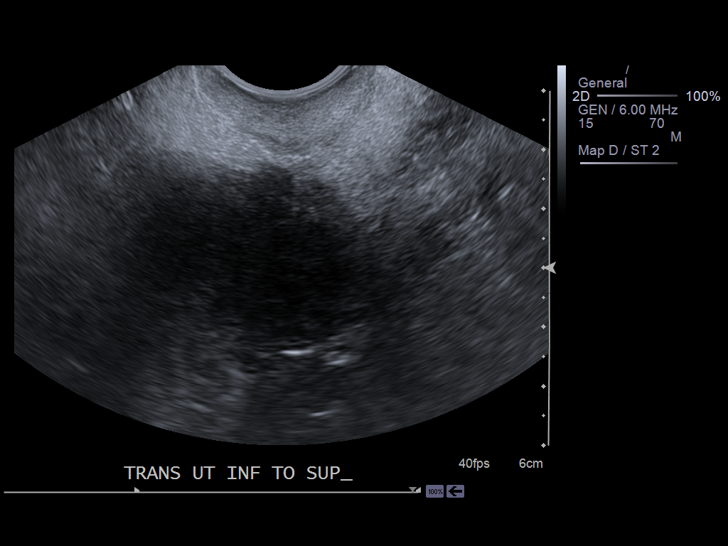
[im 62/100]
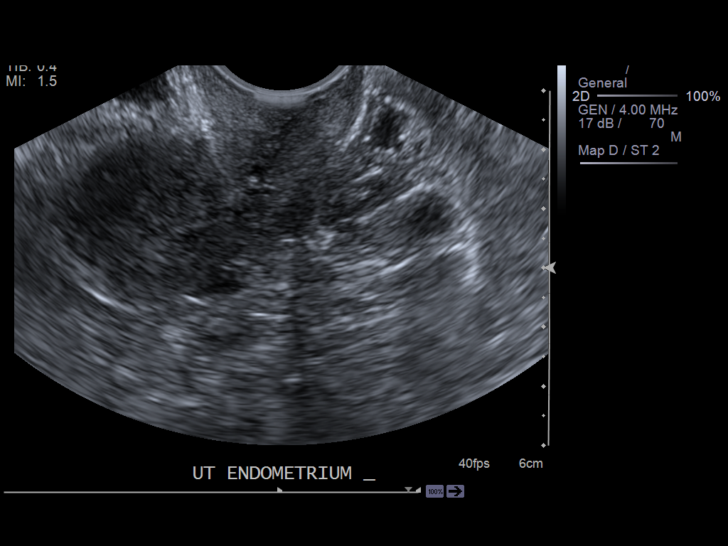
[im 67/100]
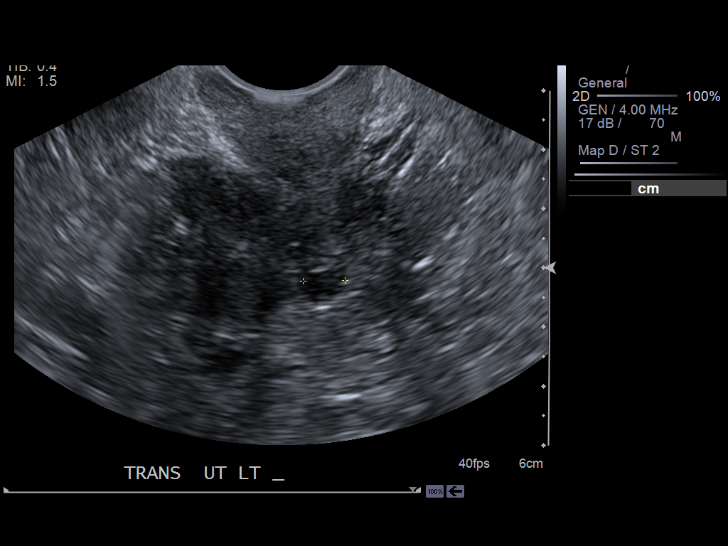
[im 75/100]
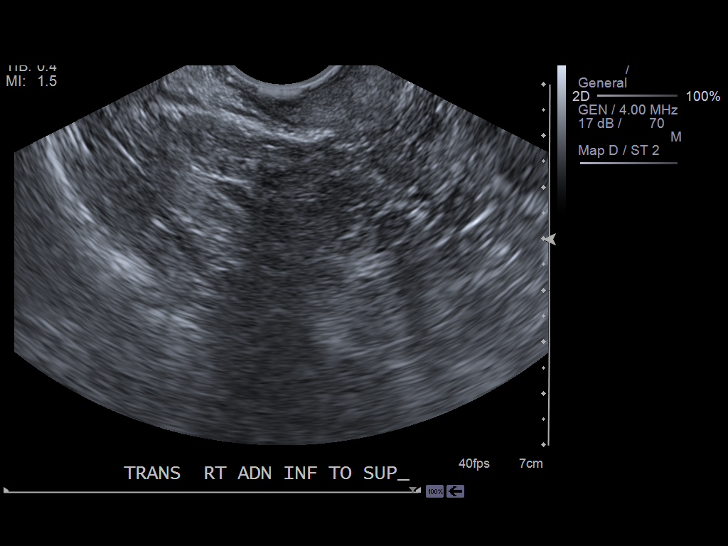
[im 83/100]
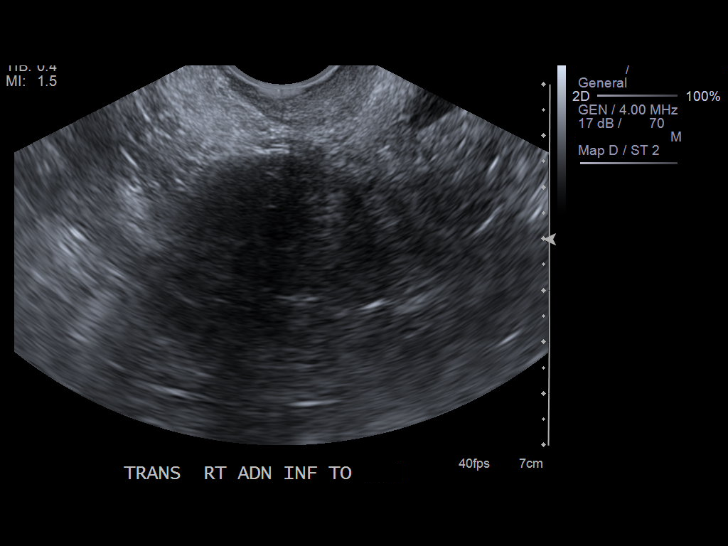
[im 91/100]
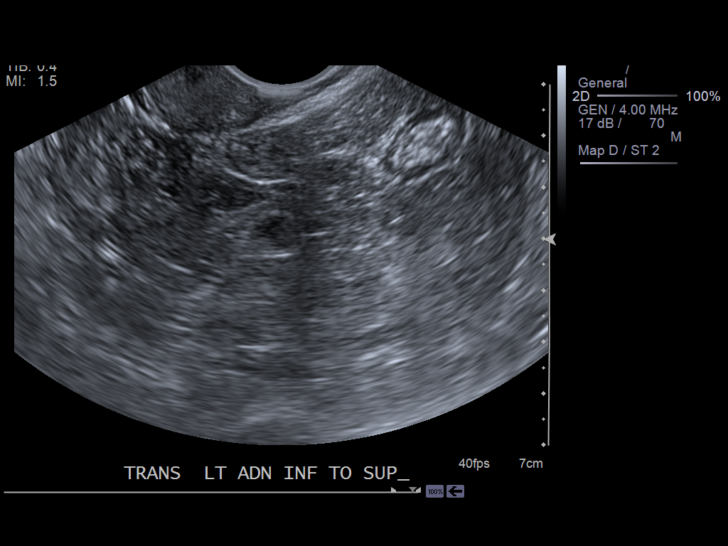
[im 100/100]
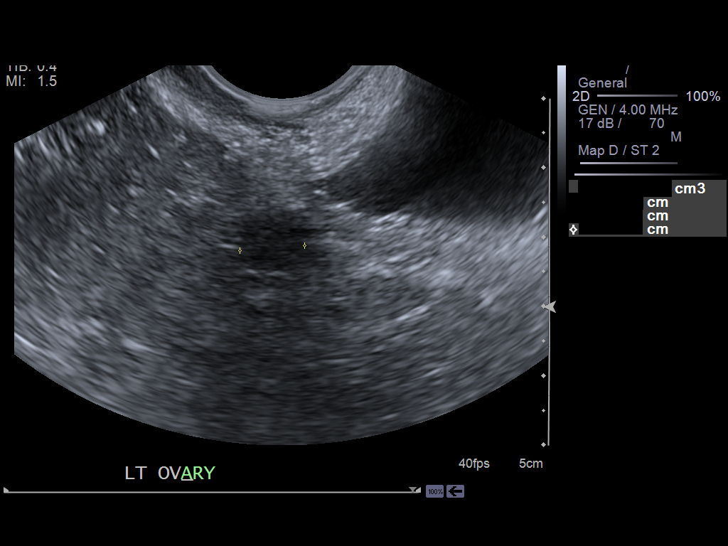

[14 of 25 positions shown; findings below may reference images not displayed]

PROCEDURE:     US  - US PELVIS EXAM W/TRANSVAGINAL  - October 26, 2011  [DATE]

RESULT:     Transabdominal pelvic sonogram and endovaginal pelvic sonogram
is performed. The uterus measures 6.70 x 3.99 x 3.35 cm. The left ovary is
seen and measures 1.23 x 0.68 x 0.94 cm. The right ovary is not visualized.
The uterus contains areas of mixed echotexture with an overall hypoechoic
appearance suggestive of uterine fibroids. The endometrial stripe thickness
is 5.2 mm. The kidneys show no obstruction on survey images. There appears
to be a nabothian cyst in the area the cervix. The largest uterine fibroid
near the fundus measures approximately 1.41 x 1.59 x 1.38 cm. Second fibroid
to the left measures 0.52 x 0.65 x 0.70 cm.
IMPRESSION: 1. Fibroid uterus.
2. Unremarkable appearance of the right ovary. The left ovary is not
visualized.

[REDACTED]

## 2013-02-05 IMAGING — US ULTRASOUND RIGHT BREAST
1 series · 13 of 18 positions shown · non-contrast
Comparison: none

REASON FOR EXAM: av rt asymmetries
COMMENTS:

PROCEDURE:     US  - US BREAST RIGHT  - November 11, 2011  [DATE]
RESULT:
Comparisons: 11/03/2011 and 01/10/2004.

[Series 1: ultrasound right breast · 0.08mm/px · 13 of 18 slices shown]
[im 1/18]
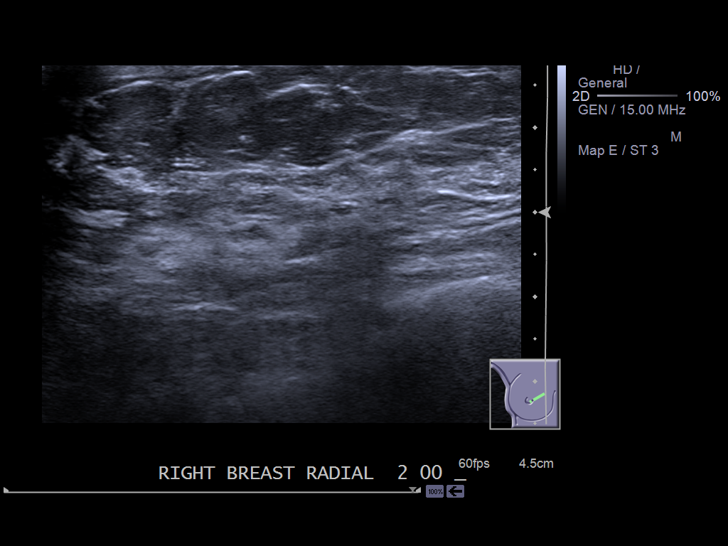
[im 3/18]
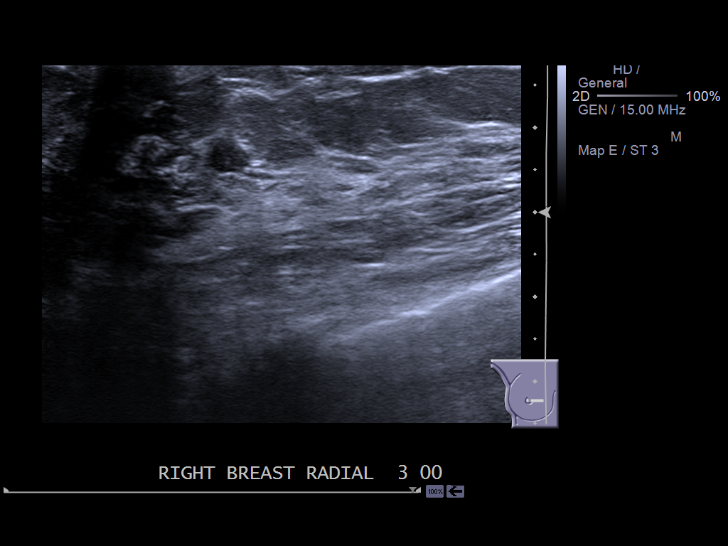
[im 4/18]
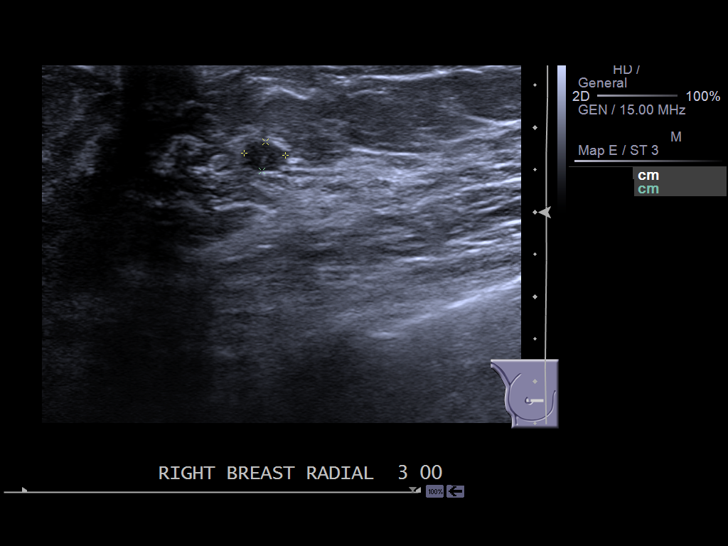
[im 5/18]
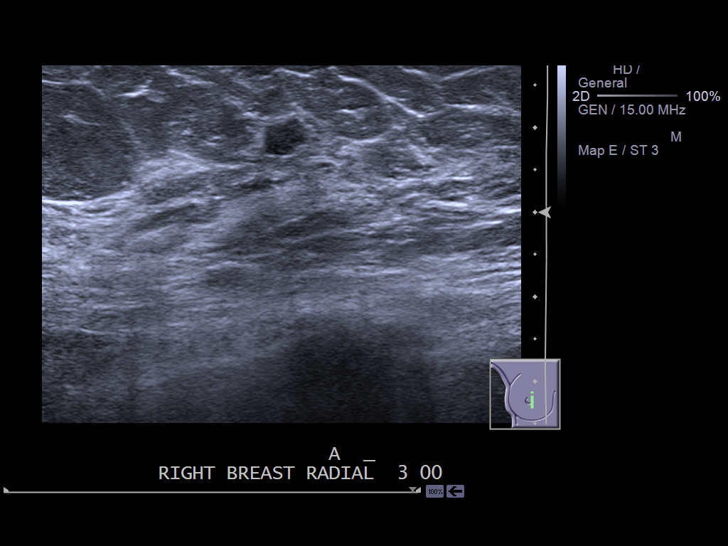
[im 7/18]
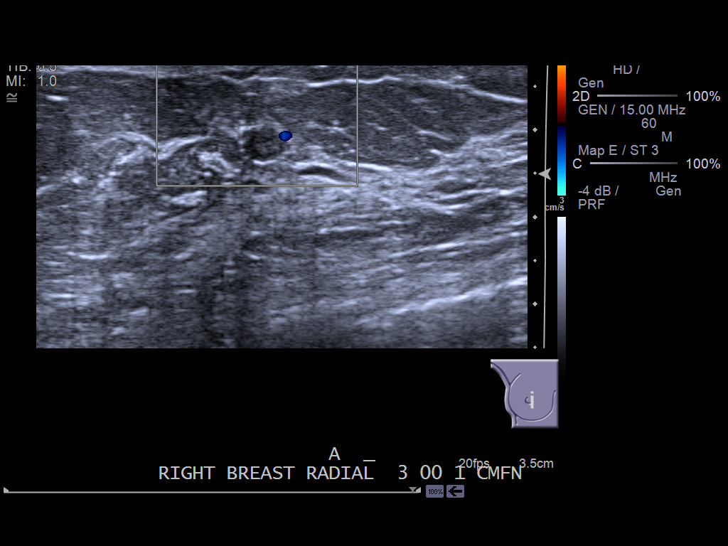
[im 8/18]
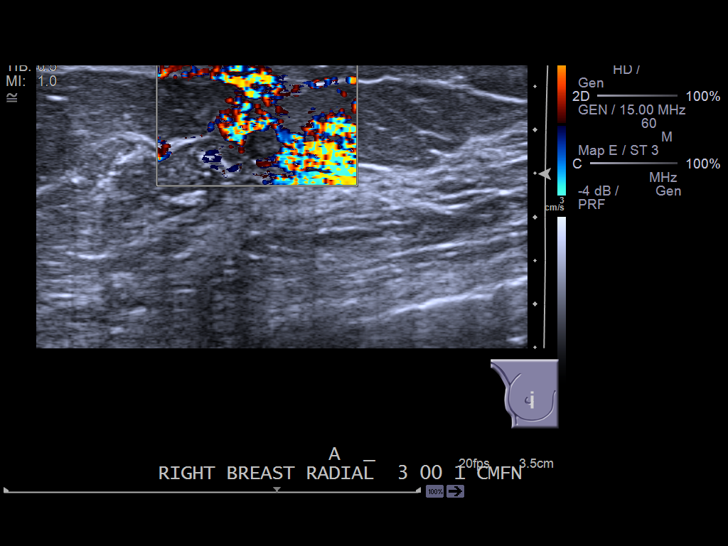
[im 10/18]
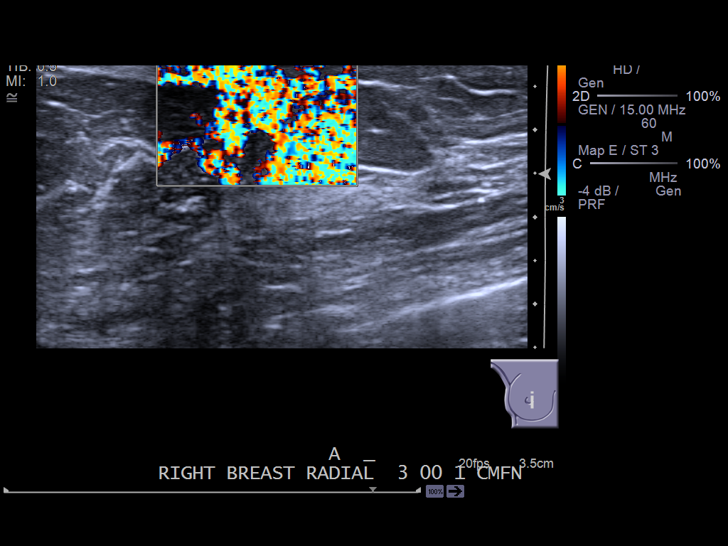
[im 11/18]
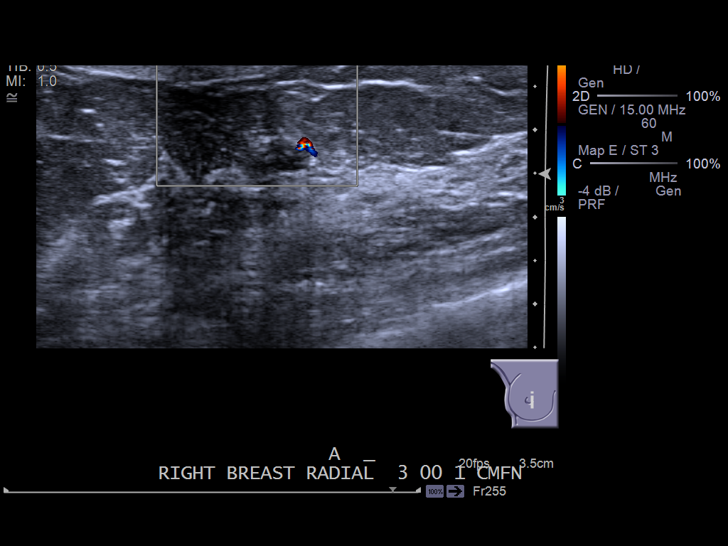
[im 12/18]
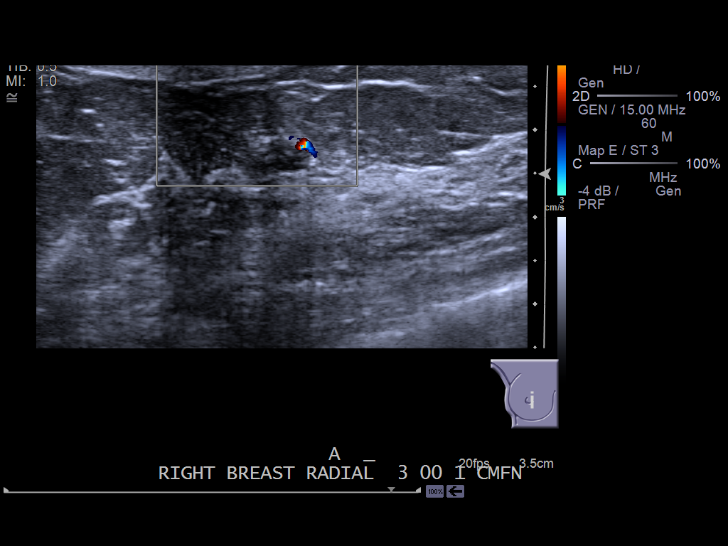
[im 14/18]
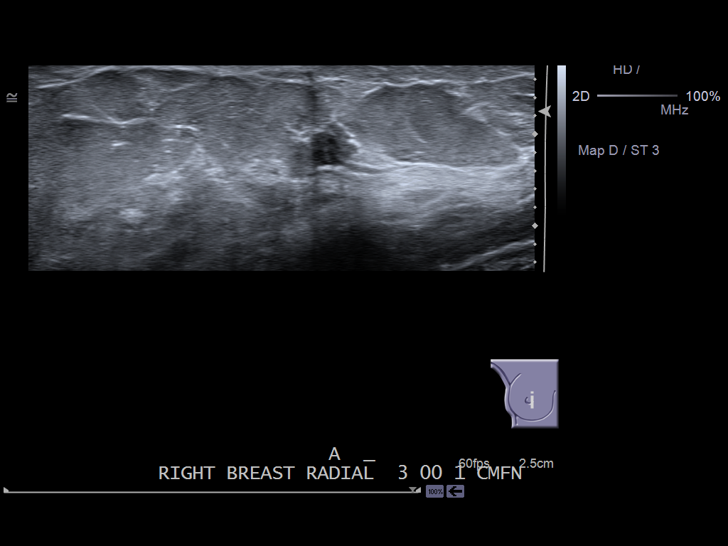
[im 15/18]
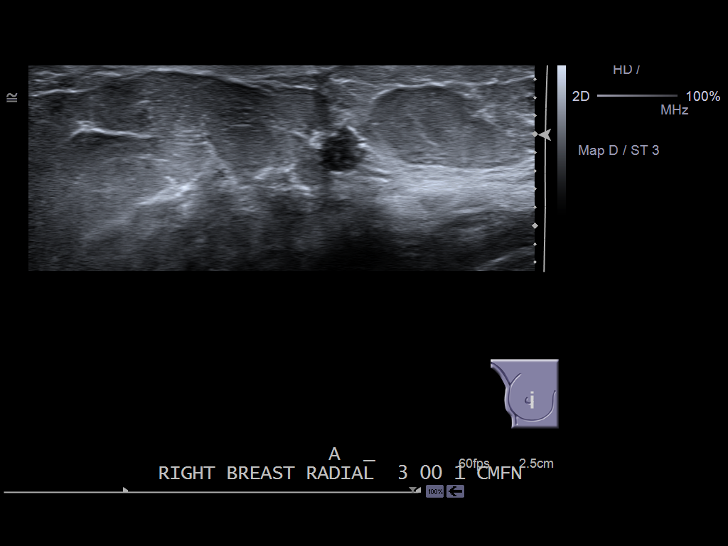
[im 16/18]
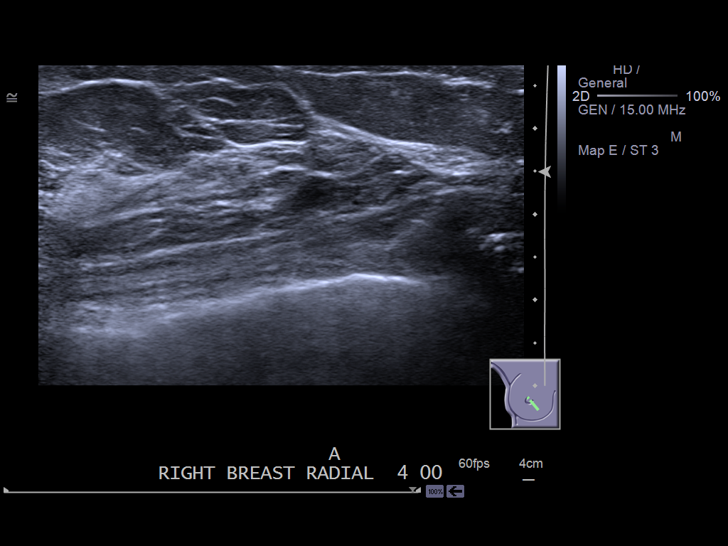
[im 18/18]
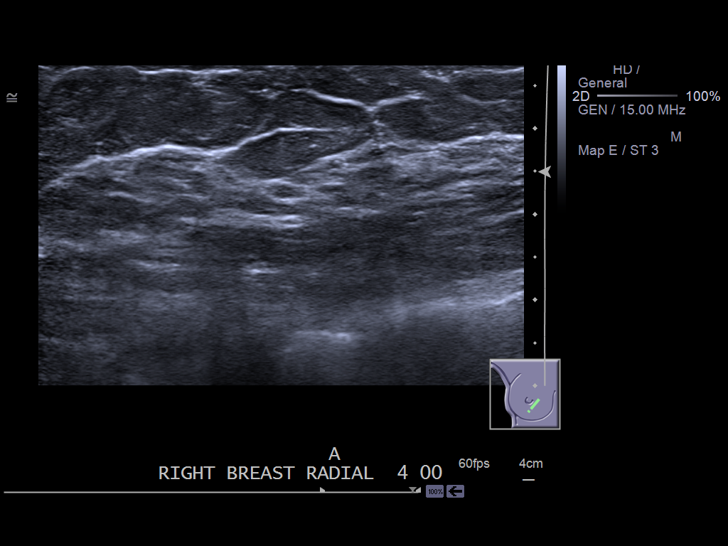

[13 of 18 positions shown; findings below may reference images not displayed]

FINDINGS: True lateral and spot compression magnification views were performed to
evaluate the findings in the anterior right breast on the screening
mammograms. These magnification views demonstrate a small, round mass in the
medial right breast at approximately 3 o'clock. The borders are mostly
well-circumscribed and some borders are obscured by overlapping
fibroglandular tissue. This mass is new from the prior mammograms.

Real time ultrasound was performed of the medial right breast from 2 o'clock
through 4 o'clock. At 3 o'clock there is a small, round hypoechoic mass.
There is no posterior acoustic shadowing or acoustic enhancement. The
borders are mostly well circumscribed though a few borders are somewhat
irregular. There appears to be a vessel feeding to this mass on the color
Doppler images. The mass measures 0.5 x 0.5 x 0.4 cm.
IMPRESSION: BI-RADS: Category 4 - Suspicious Abnormality.

Surgical consultation and tissue diagnosis are recommended for the new small
solid mass in the medial right breast.

## 2013-06-19 ENCOUNTER — Ambulatory Visit (INDEPENDENT_AMBULATORY_CARE_PROVIDER_SITE_OTHER): Payer: 59 | Admitting: Internal Medicine

## 2013-06-19 ENCOUNTER — Encounter: Payer: Self-pay | Admitting: Internal Medicine

## 2013-06-19 VITALS — BP 146/84 | HR 84 | Temp 98.1°F | Resp 16 | Wt 136.2 lb

## 2013-06-19 DIAGNOSIS — Z860100 Personal history of colon polyps, unspecified: Secondary | ICD-10-CM | POA: Insufficient documentation

## 2013-06-19 DIAGNOSIS — Z8719 Personal history of other diseases of the digestive system: Secondary | ICD-10-CM

## 2013-06-19 DIAGNOSIS — R52 Pain, unspecified: Secondary | ICD-10-CM

## 2013-06-19 DIAGNOSIS — E785 Hyperlipidemia, unspecified: Secondary | ICD-10-CM

## 2013-06-19 DIAGNOSIS — R1031 Right lower quadrant pain: Secondary | ICD-10-CM

## 2013-06-19 DIAGNOSIS — R1032 Left lower quadrant pain: Secondary | ICD-10-CM

## 2013-06-19 LAB — COMPREHENSIVE METABOLIC PANEL
ALBUMIN: 4.4 g/dL (ref 3.5–5.2)
ALT: 17 U/L (ref 0–35)
AST: 21 U/L (ref 0–37)
Alkaline Phosphatase: 85 U/L (ref 39–117)
BILIRUBIN TOTAL: 0.7 mg/dL (ref 0.3–1.2)
BUN: 16 mg/dL (ref 6–23)
CO2: 31 meq/L (ref 19–32)
Calcium: 9.7 mg/dL (ref 8.4–10.5)
Chloride: 102 mEq/L (ref 96–112)
Creatinine, Ser: 0.9 mg/dL (ref 0.4–1.2)
GFR: 69.06 mL/min (ref >60.00)
GLUCOSE: 103 mg/dL — AB (ref 70–99)
Potassium: 4.3 mEq/L (ref 3.5–5.1)
SODIUM: 139 meq/L (ref 135–145)
TOTAL PROTEIN: 7.6 g/dL (ref 6.0–8.3)

## 2013-06-19 LAB — CBC WITH DIFFERENTIAL/PLATELET
BASOS ABS: 0 10*3/uL (ref 0.0–0.1)
BASOS PCT: 0.3 % (ref 0.0–3.0)
Eosinophils Absolute: 0 10*3/uL (ref 0.0–0.7)
Eosinophils Relative: 0.6 % (ref 0.0–5.0)
HEMATOCRIT: 39.5 % (ref 36.0–46.0)
HEMOGLOBIN: 13.3 g/dL (ref 12.0–15.0)
LYMPHS ABS: 1.4 10*3/uL (ref 0.7–4.0)
LYMPHS PCT: 27.4 % (ref 12.0–46.0)
MCHC: 33.7 g/dL (ref 30.0–36.0)
MCV: 85.8 fl (ref 78.0–100.0)
MONOS PCT: 7 % (ref 3.0–12.0)
Monocytes Absolute: 0.3 10*3/uL (ref 0.1–1.0)
NEUTROS ABS: 3.2 10*3/uL (ref 1.4–7.7)
Neutrophils Relative %: 64.7 % (ref 43.0–77.0)
Platelets: 223 10*3/uL (ref 150.0–400.0)
RBC: 4.6 Mil/uL (ref 3.87–5.11)
RDW: 13.2 % (ref 11.5–14.6)
WBC: 4.9 10*3/uL (ref 4.5–10.5)

## 2013-06-19 LAB — SEDIMENTATION RATE: Sed Rate: 15 mm/hr (ref 0–22)

## 2013-06-19 LAB — C-REACTIVE PROTEIN: CRP: <0.5 mg/dL (ref 0.5–20.0)

## 2013-06-19 MED ORDER — LACTULOSE 20 GM/30ML PO SOLN: 30.0000 mL | Freq: Four times a day (QID) | ORAL | Status: DC | PRN

## 2013-06-19 NOTE — Progress Notes (Signed)
Patient ID: Sharon Webster, female   DOB: [birth year], 68 y.o.   MRN: [id number]  Patient Active Problem List   Diagnosis Date Noted  . Personal history of colonic polyps 06/19/2013  . Atypical ductal hyperplasia of breast 01/01/2013  . Abnormal mammogram 06/30/2012  . Mass of breast, right 11/16/2011  . Abdominal pain, acute, right lower quadrant 10/24/2011  . Hepatic steatosis 10/24/2011  . Ovarian cyst, right 10/24/2011  . Other and unspecified hyperlipidemia 10/17/2011  . COPD (chronic obstructive pulmonary disease)   . GERD (gastroesophageal reflux disease)     Subjective:  CC:   Chief Complaint  Patient presents with  . Acute Visit    diverticulitis    HPI:   Sharon Webster is a 68 y.o. female who presents for Recurrent intermittent  bilateral LQ pain accompaied by increased flatus, gas ,  Not relieved with pepto bismol.  moves bowels daily but has to strain frequently and uses an enema once a week,  Last colonosocpy was done by [facility] , cant' remember when .  Diverticulosis  and several polyps found    Having intermittent subjective fever and chills  for the past two weeks  Have been worse andAnd persistent for the past two days.  Stool this morning was very small,  yesterday ate only chicken noodle soup,   drinkin gtea and coffee  Last seen Sept 2013.  Breast biopsy as benign, atypical ductal hypertrophy by  Dr. Emilio Math, since then,  No hospitalizations.     Past Medical History  Diagnosis Date  . Duodenitis   . COPD (chronic obstructive pulmonary disease)   . GERD (gastroesophageal reflux disease)   . Diverticulosis   . Glaucoma   . Personal history of tobacco use, presenting hazards to health   . Breast screening, unspecified   . Lump or mass in breast   . Special screening for malignant neoplasms, colon   . Bowel trouble     Past Surgical History  Procedure Laterality Date  . Colonoscopy  2010    polyps, Dr. Dionne Milo  . Breast biopsy  12/14/11     The patient underwent a Finesse biopsy of the right breast December 14, 2011. This was completed as her FNA was inconclusive, with atypical ductal cells identified.       The following portions of the patient's history were reviewed and updated as appropriate: Allergies, current medications, and problem list.    Review of Systems:   Patient denies headache, fevers, malaise, unintentional weight loss, skin rash, eye pain, sinus congestion and sinus pain, sore throat, dysphagia,  hemoptysis , cough, dyspnea, wheezing, chest pain, palpitations, orthopnea, edema, abdominal pain, nausea, melena, diarrhea, constipation, flank pain, dysuria, hematuria, urinary  Frequency, nocturia, numbness, tingling, seizures,  Focal weakness, Loss of consciousness,  Tremor, insomnia, depression, anxiety, and suicidal ideation.     History   Social History  . Marital Status: Widowed    Spouse Name: N/A    Number of Children: N/A  . Years of Education: N/A   Occupational History  . Not on file.   Social History Main Topics  . Smoking status: Former Smoker -- 1.00 packs/day for 20 years    Types: Cigarettes    Quit date: 05/15/2001  . Smokeless tobacco: Never Used  . Alcohol Use: 3.6 oz/week    6 Cans of beer per week  . Drug Use: No  . Sexual Activity: Not on file   Other Topics Concern  . Not on file  Social History Narrative  . No narrative on file    Objective:  Filed Vitals:   06/19/13 1007  BP: 146/84  Pulse: 84  Temp: 98.1 F (36.7 C)  Resp: 16     General appearance: alert, cooperative and appears stated age Ears: normal TM's and external ear canals both ears Throat: lips, mucosa, and tongue normal; teeth and gums normal Neck: no adenopathy, no carotid bruit, supple, symmetrical, trachea midline and thyroid not enlarged, symmetric, no tenderness/mass/nodules Back: symmetric, no curvature. ROM normal. No CVA tenderness. Lungs: clear to auscultation bilaterally Heart:  regular rate and rhythm, S1, S2 normal, no murmur, click, rub or gallop Abdomen: soft, non-tender; bowel sounds normal; no masses,  no organomegaly Pulses: 2+ and symmetric Skin: Skin color, texture, turgor normal. No rashes or lesions Lymph nodes: Cervical, supraclavicular, and axillary nodes normal.  Assessment and Plan:  Abdominal pain, acute, right lower quadrant Suspect diverticulitis. However, ESR, cRP and CBC are completely normal.  Will treat with lactulose and if symptoms do not resolve,  GI evaluation with CT abd and pelvis .  Last CT for same symptoms iin 2013 showed diverticulosis in the sigmoid colon.  Lab Results  Component Value Date   ESRSEDRATE 15 06/19/2013   Lab Results  Component Value Date   CRP <0.5 06/19/2013   Lab Results  Component Value Date   WBC 4.9 06/19/2013   HGB 13.3 06/19/2013   HCT 39.5 06/19/2013   MCV 85.8 06/19/2013   PLT 223.0 06/19/2013      Personal history of colonic polyps June 2011 colonoscopy with multiple polyps found.  Need for follow up .  A total of 40 minutes was spent with patient more than half of which was spent in counseling, reviewing records from other prviders and coordination of care.   Updated Medication List Outpatient Encounter Prescriptions as of 06/19/2013  Medication Sig  . bismuth subsalicylate (PEPTO BISMOL) 262 MG/15ML suspension Take 15 mLs by mouth every 6 (six) hours as needed for indigestion.  Marland Kitchen esomeprazole (NEXIUM) 40 MG capsule Take 1 capsule (40 mg total) by mouth daily before breakfast.  . omeprazole (PRILOSEC) 20 MG capsule Take 20 mg by mouth 2 (two) times daily before a meal.  . etodolac (LODINE XL) 500 MG 24 hr tablet Take 500 mg by mouth daily.  . Lactulose 20 GM/30ML SOLN Take 30 mLs (20 g total) by mouth every 6 (six) hours as needed. To relieve constipation  . Simethicone (GAS-X PO) Take by mouth.     Orders Placed This Encounter  Procedures  . HM MAMMOGRAPHY  . Sedimentation rate  . C-reactive  protein  . Comp Met (CMET)  . CBC with Differential  . Lipid panel  . HM PAP SMEAR  . POCT Urinalysis Dipstick    No Follow-up on file.

## 2013-06-19 NOTE — Assessment & Plan Note (Signed)
June 2011 colonoscopy with multiple polyps found.  Need for follow up .

## 2013-06-19 NOTE — Progress Notes (Signed)
Pre-visit discussion using our clinic review tool. No additional management support is needed unless otherwise documented below in the visit note.  

## 2013-06-19 NOTE — Assessment & Plan Note (Addendum)
Suspect diverticulitis. However, ESR, cRP and CBC are completely normal.  Will treat with lactulose and if symptoms do not resolve,  GI evaluation with CT abd and pelvis .  Last CT for same symptoms iin 2013 showed diverticulosis in the sigmoid colon.  Lab Results  Component Value Date   ESRSEDRATE 15 06/19/2013   Lab Results  Component Value Date   CRP <0.5 06/19/2013   Lab Results  Component Value Date   WBC 4.9 06/19/2013   HGB 13.3 06/19/2013   HCT 39.5 06/19/2013   MCV 85.8 06/19/2013   PLT 223.0 06/19/2013

## 2013-06-19 NOTE — Patient Instructions (Signed)
I suspect you are having diverticulitis  If your labs confirm it, I will call in cipro and flagyl,  Two antibiotics to treat the inflammation and a clear liquid diet until the symptoms improve  If your labs are completely normal,  Take lactulose to relieve your constipation followed by daily miralax   Ct scan of abdomen and pelvis if no improvement in 48 hours with lactulose  Diet The clear liquid diet consists of foods that are liquid or will become liquid at room temperature. Examples of foods allowed on a clear liquid diet include fruit juice, broth or bouillon, gelatin, or frozen ice pops. You should be able to see through the liquid. The purpose of this diet is to provide the necessary fluids, electrolytes (such as sodium and potassium), and energy to keep the body functioning during times when you are not able to consume a regular diet. A clear liquid diet should not be continued for long periods of time, as it is not nutritionally adequate.  A CLEAR LIQUID DIET MAY BE NEEDED:  When a sudden-onset (acute) condition occurs before or after surgery.   As the first step in oral feeding.   For fluid and electrolyte replacement in diarrheal diseases.   As a diet before certain medical tests are performed.  ADEQUACY The clear liquid diet is adequate only in ascorbic acid, according to the Recommended Dietary Allowances of the [employer].  CHOOSING FOODS Breads and Starches  Allowed: None are allowed.   Avoid: All are to be avoided.  Vegetables  Allowed: Strained vegetable juices.   Avoid: Any others.  Fruit  Allowed: Strained fruit juices and fruit drinks. Include 1 serving of citrus or vitamin C-enriched fruit juice daily.   Avoid: Any others.  Meat and Meat Substitutes  Allowed: None are allowed.   Avoid: All are to be avoided.  Milk Products  Allowed: None are allowed.   Avoid: All are to be avoided.  Soups and Combination  Foods  Allowed: Clear bouillon, broth, or strained broth-based soups.   Avoid: Any others.  Desserts and Sweets  Allowed: Sugar, honey. High-protein gelatin. Flavored gelatin, ices, or frozen ice pops that do not contain milk.   Avoid: Any others.  Fats and Oils  Allowed: None are allowed.   Avoid: All are to be avoided.  Beverages  Allowed: Cereal beverages, coffee (regular or decaffeinated), tea, or soda at the discretion of your health care provider.   Avoid: Any others.  Condiments  Allowed: Salt.   Avoid: Any others, including pepper.  Supplements  Allowed: Liquid nutrition beverages that you can see through.   Avoid: Any others that contain lactose or fiber. SAMPLE MEAL PLAN Breakfast  4 oz (120 mL) strained orange juice.   to 1 cup (120 to 240 mL) gelatin (plain or fortified).  1 cup (240 mL) beverage (coffee or tea).  Sugar, if desired. Midmorning Snack   cup (120 mL) gelatin (plain or fortified). Lunch  1 cup (240 mL) broth or consomm.  4 oz (120 mL) strained grapefruit juice.   cup (120 mL) gelatin (plain or fortified).  1 cup (240 mL) beverage (coffee or tea).  Sugar, if desired. Midafternoon Snack   cup (120 mL) fruit ice.   cup (120 mL) strained fruit juice. Dinner  1 cup (240 mL) broth or consomm.   cup (120 mL) cranberry juice.   cup (120 mL) flavored gelatin (plain or fortified).  1 cup (240 mL) beverage (coffee or tea).  Sugar, if desired. Evening Snack  4 oz (120 mL) strained apple juice (vitamin C-fortified).   cup (120 mL) flavored gelatin (plain or fortified). MAKE SURE YOU:  Understand these instructions.  Will watch your child's condition.  Will get help right away if your child is not doing well or gets worse. Document Released: 03/01/2005 Document Revised: 11/01/2012 Document Reviewed: 08/01/2012 [facility] Patient Information 2014 [place].

## 2013-06-20 ENCOUNTER — Telehealth: Payer: Self-pay | Admitting: *Deleted

## 2013-06-20 NOTE — Telephone Encounter (Signed)
She is constipated,  Take another dose of lactulose and if no results. i will order plain films and call in a bowel prep.

## 2013-06-20 NOTE — Telephone Encounter (Signed)
Patient notified labs normal, patient has not had BM as of yet just lots of gas.

## 2013-06-20 NOTE — Telephone Encounter (Signed)
Labs were normal,  No signs of infection.,  Has she had a bowel movement yet.?

## 2013-06-20 NOTE — Telephone Encounter (Signed)
Patient called to check on labs, and C/O being up most of night with Bloating and gas took the lactulose as recommended patient still having abdominal pain. Please advise.

## 2013-06-20 NOTE — Telephone Encounter (Signed)
Patient notified

## 2013-06-21 ENCOUNTER — Telehealth: Payer: Self-pay | Admitting: *Deleted

## 2013-06-21 ENCOUNTER — Telehealth: Payer: Self-pay | Admitting: Internal Medicine

## 2013-06-21 NOTE — Telephone Encounter (Signed)
Patient left message stating she would like to know if she could eat anything. She has not had anything to eat since Monday.

## 2013-06-21 NOTE — Telephone Encounter (Signed)
Patient notified

## 2013-06-21 NOTE — Telephone Encounter (Signed)
If pain is resolved, no more testing needed. Start daily metamucil

## 2013-06-21 NOTE — Telephone Encounter (Signed)
Patient stated she had several large BM during the night. With the second dose of lactulose. Please advise. Patient stated you had mentioned metamucil.

## 2013-06-22 NOTE — Telephone Encounter (Signed)
Notified patient as instructed.   

## 2013-06-22 NOTE — Telephone Encounter (Signed)
Any diarrhea or fevers?

## 2013-06-22 NOTE — Telephone Encounter (Signed)
Pt left vm 4/10.  States she would like a call from the nurse.  States she is still having problems.  No further details given.

## 2013-06-22 NOTE — Telephone Encounter (Signed)
Simplify the diet to bland,  No fat, no lettuce, no dairy.    Continue metamucil daily    If Pain worsens over the weekend ,  Go to ER bc she will need a CT scan

## 2013-06-22 NOTE — Telephone Encounter (Signed)
Bowels are loose but afebrile, abdominal pain  Rated at 6 out of ten  Now last night was a 9.

## 2013-06-22 NOTE — Telephone Encounter (Signed)
Patient ate 2 scrabbled eggs in butter last night with apple juice and woke up severe abdominal cramping about 3 am.

## 2013-06-29 ENCOUNTER — Ambulatory Visit (INDEPENDENT_AMBULATORY_CARE_PROVIDER_SITE_OTHER): Payer: 59 | Admitting: Internal Medicine

## 2013-06-29 ENCOUNTER — Encounter: Payer: Self-pay | Admitting: Internal Medicine

## 2013-06-29 VITALS — BP 144/84 | HR 84 | Temp 98.1°F | Resp 16 | Wt 132.0 lb

## 2013-06-29 DIAGNOSIS — G8929 Other chronic pain: Secondary | ICD-10-CM

## 2013-06-29 DIAGNOSIS — N83201 Unspecified ovarian cyst, right side: Secondary | ICD-10-CM

## 2013-06-29 DIAGNOSIS — Z860100 Personal history of colon polyps, unspecified: Secondary | ICD-10-CM

## 2013-06-29 DIAGNOSIS — N83209 Unspecified ovarian cyst, unspecified side: Secondary | ICD-10-CM

## 2013-06-29 DIAGNOSIS — R1031 Right lower quadrant pain: Secondary | ICD-10-CM

## 2013-06-29 MED ORDER — DICYCLOMINE HCL 20 MG PO TABS: 20.0000 mg | ORAL_TABLET | Freq: Three times a day (TID) | ORAL | Status: DC

## 2013-06-29 NOTE — Progress Notes (Signed)
Pre-visit discussion using our clinic review tool. No additional management support is needed unless otherwise documented below in the visit note.  

## 2013-06-29 NOTE — Patient Instructions (Signed)
Irritable Bowel Syndrome °Irritable Bowel Syndrome (IBS) is caused by a disturbance of normal bowel function. Other terms used are spastic colon, mucous colitis, and irritable colon. It does not require surgery, nor does it lead to cancer. There is no cure for IBS. But with proper diet, stress reduction, and medication, you will find that your problems (symptoms) will gradually disappear or improve. IBS is a common digestive disorder. It usually appears in late adolescence or early adulthood. Women develop it twice as often as men. °CAUSES  °After food has been digested and absorbed in the small intestine, waste material is moved into the colon (large intestine). In the colon, water and salts are absorbed from the undigested products coming from the small intestine. The remaining residue, or fecal material, is held for elimination. Under normal circumstances, gentle, rhythmic contractions on the bowel walls push the fecal material along the colon towards the rectum. In IBS, however, these contractions are irregular and poorly coordinated. The fecal material is either retained too long, resulting in constipation, or expelled too soon, producing diarrhea. °SYMPTOMS  °The most common symptom of IBS is pain. It is typically in the lower left side of the belly (abdomen). But it may occur anywhere in the abdomen. It can be felt as heartburn, backache, or even as a dull pain in the arms or shoulders. The pain comes from excessive bowel-muscle spasms and from the buildup of gas and fecal material in the colon. This pain: °· Can range from sharp belly (abdominal) cramps to a dull, continuous ache. °· Usually worsens soon after eating. °· Is typically relieved by having a bowel movement or passing gas. °Abdominal pain is usually accompanied by constipation. But it may also produce diarrhea. The diarrhea typically occurs right after a meal or upon arising in the morning. The stools are typically soft and watery. They are often  flecked with secretions (mucus). °Other symptoms of IBS include: °· Bloating. °· Loss of appetite. °· Heartburn. °· Feeling sick to your stomach (nausea). °· Belching °· Vomiting °· Gas. °IBS may also cause a number of symptoms that are unrelated to the digestive system: °· Fatigue. °· Headaches. °· Anxiety °· Shortness of breath °· Difficulty in concentrating. °· Dizziness. °These symptoms tend to come and go. °DIAGNOSIS  °The symptoms of IBS closely mimic the symptoms of other, more serious digestive disorders. So your caregiver may wish to perform a variety of additional tests to exclude these disorders. He/she wants to be certain of learning what is wrong (diagnosis). The nature and purpose of each test will be explained to you. °TREATMENT °A number of medications are available to help correct bowel function and/or relieve bowel spasms and abdominal pain. Among the drugs available are: °· Mild, non-irritating laxatives for severe constipation and to help restore normal bowel habits. °· Specific anti-diarrheal medications to treat severe or prolonged diarrhea. °· Anti-spasmodic agents to relieve intestinal cramps. °· Your caregiver may also decide to treat you with a mild tranquilizer or sedative during unusually stressful periods in your life. °The important thing to remember is that if any drug is prescribed for you, make sure that you take it exactly as directed. Make sure that your caregiver knows how well it worked for you. °HOME CARE INSTRUCTIONS  °· Avoid foods that are high in fat or oils. Some examples are:heavy cream, butter, frankfurters, sausage, and other fatty meats. °· Avoid foods that have a laxative effect, such as fruit, fruit juice, and dairy products. °· Cut out   carbonated drinks, chewing gum, and "gassy" foods, such as beans and cabbage. This may help relieve bloating and belching. °· Bran taken with plenty of liquids may help relieve constipation. °· Keep track of what foods seem to trigger  your symptoms. °· Avoid emotionally charged situations or circumstances that produce anxiety. °· Start or continue exercising. °· Get plenty of rest and sleep. °MAKE SURE YOU:  °· Understand these instructions. °· Will watch your condition. °· Will get help right away if you are not doing well or get worse. °Document Released: 03/01/2005 Document Revised: 05/24/2011 Document Reviewed: 10/20/2007 °[employer]® Patient Information ©2014 [employer]. ° °

## 2013-06-29 NOTE — Progress Notes (Signed)
Patient ID: Sharon Webster, female   DOB: February 12, 1946, 68 y.o.   MRN: [id number]  Patient Active Problem List   Diagnosis Date Noted  . Personal history of colonic polyps 06/19/2013  . Atypical ductal hyperplasia of breast 01/01/2013  . Abnormal mammogram 06/30/2012  . Mass of breast, right 11/16/2011  . Chronic RLQ pain 10/24/2011  . Hepatic steatosis 10/24/2011  . Ovarian cyst, right 10/24/2011  . Other and unspecified hyperlipidemia 10/17/2011  . COPD (chronic obstructive pulmonary disease)   . GERD (gastroesophageal reflux disease)     Subjective:  CC:   Chief Complaint  Patient presents with  . Follow-up    HPI:   Sharon Webster is a 68 y.o. female who presents for 10 day follow up on  Right lower abdominal pain and constipation.  BMs are alternating between formed narrow caliber stools  and loose stools .  Having 2 BMS twice daily since starting lactulose which was started initially for constipation and concern for diverticulitis. ESR, CBC  CRP were normal, so patient was not prescribed antibiotics but proceeded with treatment for constipation only and simplified her diet for a few days. .   Got "cleaned out " by lactulose but taking recurrent doses has not helped with the RLQ pain , abdominal distension or the excessive gas.  Feels really uncomfortable most of the time.   Now eating a prudent diet. States that the RLQ has actually been intermittent for most of her adult life.   She has a history of 4  polyps found on 2011 colonoscopy. Has not had follow up since Iftikhar left commmunity.   No diverticulosis mentioned in report, but her  2013 Abd CT done for similar symptoms suggested mild sigmoid diverticulosis.   Right ovarian cyst seen on same 2013 CT was evaluated with pelvic ultrasound and was NOT seen on pelvic ultrasound.    Past Medical History  Diagnosis Date  . Duodenitis   . COPD (chronic obstructive pulmonary disease)   . GERD (gastroesophageal reflux  disease)   . Diverticulosis   . Glaucoma   . Personal history of tobacco use, presenting hazards to health   . Breast screening, unspecified   . Lump or mass in breast   . Special screening for malignant neoplasms, colon   . Bowel trouble     Past Surgical History  Procedure Laterality Date  . Colonoscopy  2010    polyps, Dr. Dionne Milo  . Breast biopsy  12/14/11    The patient underwent a Finesse biopsy of the right breast December 14, 2011. This was completed as her FNA was inconclusive, with atypical ductal cells identified.       The following portions of the patient's history were reviewed and updated as appropriate: Allergies, current medications, and problem list.    Review of Systems:   Patient denies headache, fevers, malaise, unintentional weight loss, skin rash, eye pain, sinus congestion and sinus pain, sore throat, dysphagia,  hemoptysis , cough, dyspnea, wheezing, chest pain, palpitations, orthopnea, edema, abdominal pain, nausea, melena, diarrhea, constipation, flank pain, dysuria, hematuria, urinary  Frequency, nocturia, numbness, tingling, seizures,  Focal weakness, Loss of consciousness,  Tremor, insomnia, depression, anxiety, and suicidal ideation.     History   Social History  . Marital Status: Widowed    Spouse Name: N/A    Number of Children: N/A  . Years of Education: N/A   Occupational History  . Not on file.   Social History Main Topics  . Smoking status: Former  Smoker -- 1.00 packs/day for 20 years    Types: Cigarettes    Quit date: 05/15/2001  . Smokeless tobacco: Never Used  . Alcohol Use: 3.6 oz/week    6 Cans of beer per week  . Drug Use: No  . Sexual Activity: Not Currently   Other Topics Concern  . Not on file   Social History Narrative  . No narrative on file    Objective:  Filed Vitals:   06/29/13 0815  BP: 144/84  Pulse: 84  Temp: 98.1 F (36.7 C)  Resp: 16     General appearance: alert, cooperative and appears stated  age Ears: normal TM's and external ear canals both ears Throat: lips, mucosa, and tongue normal; teeth and gums normal Neck: no adenopathy, no carotid bruit, supple, symmetrical, trachea midline and thyroid not enlarged, symmetric, no tenderness/mass/nodules Back: symmetric, no curvature. ROM normal. No CVA tenderness. Lungs: clear to auscultation bilaterally Heart: regular rate and rhythm, S1, S2 normal, no murmur, click, rub or gallop Abdomen: soft, non-tender; no guarding ,  bowel sounds normal; no masses,  no organomegaly Pulses: 2+ and symmetric Skin: Skin color, texture, turgor normal. No rashes or lesions Lymph nodes: Cervical, supraclavicular, and axillary nodes normal.  Assessment and Plan:  Chronic RLQ pain Periodically recurrent for year with no mention of right sided diverticulosis by prior colonoscopy report or prior CT in 2013, and in the absence of fevers, inflammatory markers and leukocytosis,  Will treat for irritable bowel with dicyclomine.  Personal history of colonic polyps She is due for colonoscopy . Refer to [facility].   Ovarian cyst, right Suggested by CT in 2013,  Not seen on follow up pelvic ultrasound.    Updated Medication List Outpatient Encounter Prescriptions as of 06/29/2013  Medication Sig  . bismuth subsalicylate (PEPTO BISMOL) 262 MG/15ML suspension Take 15 mLs by mouth every 6 (six) hours as needed for indigestion.  . Lactulose 20 GM/30ML SOLN Take 30 mLs (20 g total) by mouth every 6 (six) hours as needed. To relieve constipation  . omeprazole (PRILOSEC) 20 MG capsule Take 20 mg by mouth 2 (two) times daily before a meal.  . Simethicone (GAS-X PO) Take by mouth.  . dicyclomine (BENTYL) 20 MG tablet Take 1 tablet (20 mg total) by mouth 3 (three) times daily before meals.  . [DISCONTINUED] esomeprazole (NEXIUM) 40 MG capsule Take 1 capsule (40 mg total) by mouth daily before breakfast.  . [DISCONTINUED] etodolac (LODINE XL) 500 MG 24 hr tablet  Take 500 mg by mouth daily.     Orders Placed This Encounter  Procedures  . Ambulatory referral to General Surgery    No Follow-up on file.

## 2013-07-01 ENCOUNTER — Encounter: Payer: Self-pay | Admitting: Internal Medicine

## 2013-07-01 NOTE — Assessment & Plan Note (Signed)
Periodically recurrent for year with no mention of right sided diverticulosis by prior colonoscopy report or prior CT in 2013, and in the absence of fevers, inflammatory markers and leukocytosis,  Will treat for irritable bowel with dicyclomine.

## 2013-07-01 NOTE — Assessment & Plan Note (Signed)
She is due for colonoscopy . Refer to [facility].

## 2013-07-01 NOTE — Assessment & Plan Note (Signed)
Suggested by CT in 2013,  Not seen on follow up pelvic ultrasound.

## 2013-07-02 ENCOUNTER — Encounter: Payer: Self-pay | Admitting: Emergency Medicine

## 2013-07-13 ENCOUNTER — Encounter: Payer: Self-pay | Admitting: General Surgery

## 2013-07-25 ENCOUNTER — Encounter: Payer: Self-pay | Admitting: General Surgery

## 2013-07-25 ENCOUNTER — Ambulatory Visit (INDEPENDENT_AMBULATORY_CARE_PROVIDER_SITE_OTHER): Payer: Medicare Other | Admitting: General Surgery

## 2013-07-25 VITALS — BP 138/78 | HR 80 | Resp 12 | Ht 60.0 in | Wt 134.0 lb

## 2013-07-25 DIAGNOSIS — N6089 Other benign mammary dysplasias of unspecified breast: Secondary | ICD-10-CM

## 2013-07-25 DIAGNOSIS — N6099 Unspecified benign mammary dysplasia of unspecified breast: Secondary | ICD-10-CM

## 2013-07-25 NOTE — Patient Instructions (Signed)
Patient to return in 6 month with bilateral diagnostic mammogram. Continue self breast exams. Call office for any new breast issues or concerns.

## 2013-07-25 NOTE — Progress Notes (Addendum)
Patient ID: Nikisha Fleece, female   DOB: [birth year], 68 y.o.   MRN: [id number]  Chief Complaint  Patient presents with  . Follow-up    6 month follow up right diagnostic mammogram and evaluation for colonoscopy    HPI Eduardo Wurth is a 68 y.o. female who presents for a breast evaluation. The most recent mammogram was done on 07/12/13. Patient does perform regular self breast checks and gets regular mammograms done.  Denies any breast issues.  Denies any gastrointestinal issues since she has been on the Bentyl. She was previously having abdominal pain with diverticulitis but now she is "good". The patient is also here today for an evaluation for a colonoscopy.     HPI  Past Medical History  Diagnosis Date  . Duodenitis   . COPD (chronic obstructive pulmonary disease)   . GERD (gastroesophageal reflux disease)   . Diverticulosis   . Glaucoma   . Personal history of tobacco use, presenting hazards to health   . Breast screening, unspecified   . Lump or mass in breast   . Special screening for malignant neoplasms, colon   . Bowel trouble   . Colon polyp     Past Surgical History  Procedure Laterality Date  . Colonoscopy  2010    polyps, Dr. Dionne Milo  . Breast biopsy  12/14/11    The patient underwent a Finesse biopsy of the right breast December 14, 2011. This was completed as her FNA was inconclusive, with atypical ductal cells identified.    Family History  Problem Relation Age of Onset  . Heart disease Mother   . COPD Mother   . Hypertension Mother   . Cancer Father     metastatic prostate CA  . Stroke Sister     hemorrhagic CVA  . Heart disease Sister     restrictive cardiomyopathy    Social History History  Substance Use Topics  . Smoking status: Former Smoker -- 1.00 packs/day for 20 years    Types: Cigarettes    Quit date: 05/15/2001  . Smokeless tobacco: Never Used  . Alcohol Use: 3.6 oz/week    6 Cans of beer per week    No Known  Allergies  Current Outpatient Prescriptions  Medication Sig Dispense Refill  . dicyclomine (BENTYL) 20 MG tablet Take 1 tablet (20 mg total) by mouth 3 (three) times daily before meals.  90 tablet  3  . omeprazole (PRILOSEC) 20 MG capsule Take 20 mg by mouth 2 (two) times daily before a meal.      . Probiotic Product ([place]) CAPS Take by mouth daily.      . Simethicone (GAS-X PO) Take by mouth.       No current facility-administered medications for this visit.    Review of Systems Review of Systems  Constitutional: Negative.   Respiratory: Negative.   Cardiovascular: Negative.   Gastrointestinal: Negative.  Negative for diarrhea, constipation and blood in stool.    Blood pressure 138/78, pulse 80, resp. rate 12, height 5' (1.524 m), weight 134 lb (60.782 kg).  Physical Exam Physical Exam  Constitutional: She is oriented to person, place, and time. She appears well-developed and well-nourished.  Neck: Neck supple. No thyromegaly present.  Cardiovascular: Normal rate, regular rhythm and normal heart sounds.   No murmur heard. Pulmonary/Chest: Effort normal and breath sounds normal. Right breast exhibits no inverted nipple, no mass, no nipple discharge, no skin change and no tenderness. Left breast exhibits no inverted nipple, no mass,  no nipple discharge, no skin change and no tenderness.  Lymphadenopathy:    She has no cervical adenopathy.    She has no axillary adenopathy.  Neurological: She is alert and oriented to person, place, and time.  Skin: Skin is warm and dry.    Data Reviewed Right breast biopsy, 3:00 completed 12/14/2011 showed a fragmented intraductal papillary proliferation with adjacent distorted sclerosis and features atypical ductal hyperplasia. The patient declined recommended reexcision  Right breast mammogram dated 07/12/2013 showed no interval change. BI-RAD for based on previous diagnosis of ADH.  D. 08/20/2009 colonoscopy completed by Dr.  Dionne Milo showed a tubular adenoma in the descending colon and transverse colon. A hyperplastic polyp was removed from the sigmoid colon. She the gastric antrum showed no pathologic change.  Assessment    Foci of ADH on core biopsy.  Candidate for screening colonoscopy in 2016 based on the findings noted above.    Plan    The patient's boyfriend was admitted to the hospital yesterday with heatstroke. He is made good recovery, but her attention is focused elsewhere at this time. We'll discuss screening colonoscopy at her followup.  I would be reluctant to initiate antiestrogen therapy for chemoprevention without formal excision of the area of concern. We'll review the patient at her followup exam.  Arrange for bilateral diagnostic mammograms at [facility] in 6 months.    PCP and Ref. MD: Tad Moore Josanne Boerema 07/26/2013, 3:06 PM

## 2013-07-26 ENCOUNTER — Encounter: Payer: Self-pay | Admitting: General Surgery

## 2013-11-20 ENCOUNTER — Other Ambulatory Visit: Payer: Self-pay | Admitting: Internal Medicine

## 2013-11-20 ENCOUNTER — Other Ambulatory Visit: Payer: Self-pay | Admitting: *Deleted

## 2013-11-20 NOTE — Telephone Encounter (Signed)
Ok refill? 

## 2013-11-21 MED ORDER — DICYCLOMINE HCL 20 MG PO TABS: 20.0000 mg | ORAL_TABLET | Freq: Three times a day (TID) | ORAL | Status: DC

## 2013-11-21 NOTE — Telephone Encounter (Signed)
Ok to refill,  Refill sent  

## 2014-01-14 ENCOUNTER — Encounter: Payer: Self-pay | Admitting: General Surgery

## 2014-01-24 ENCOUNTER — Ambulatory Visit (INDEPENDENT_AMBULATORY_CARE_PROVIDER_SITE_OTHER): Payer: Medicare Other | Admitting: General Surgery

## 2014-01-24 ENCOUNTER — Encounter: Payer: Self-pay | Admitting: General Surgery

## 2014-01-24 VITALS — BP 152/80 | HR 82 | Resp 14 | Ht 60.0 in | Wt 130.0 lb

## 2014-01-24 DIAGNOSIS — Z86010 Personal history of colonic polyps: Secondary | ICD-10-CM

## 2014-01-24 DIAGNOSIS — N62 Hypertrophy of breast: Secondary | ICD-10-CM

## 2014-01-24 DIAGNOSIS — N6099 Unspecified benign mammary dysplasia of unspecified breast: Secondary | ICD-10-CM

## 2014-01-24 NOTE — Patient Instructions (Addendum)
Continue self breast exams. Call office for any new breast issues or concerns. Consider adding fiber supplement.Marland Kitchen

## 2014-01-24 NOTE — Progress Notes (Signed)
Patient ID: Sharon Webster, female   DOB: Jan 17, 1946, 68 y.o.   MRN: [id number]  Chief Complaint  Patient presents with  . Follow-up    mammogram  . Colonoscopy    discussion    HPI Sharon Webster is a 68 y.o. female who presents for a breast evaluation. The most recent mammogram was done on 01/18/14 . Patient does perform regular self breast checks and gets regular mammograms done.  She has been having abdominal cramps, right lower quadrant and gas for about 3 weeks. She states it comes and goes. States "I feel swollen". Not associated with foods or diarrhea. She tends to be constipated. Denies any blood in stools. Last colonoscopy was done in 2011. She has had pain similar to this in the past and even when she had the colonoscopy in 2011. She does states she is stressed more lately. She has been taking care of her boyfriend Jeneen Rinks Tapp) who had complications from a surgery.  HPI  Past Medical History  Diagnosis Date  . Duodenitis   . COPD (chronic obstructive pulmonary disease)   . GERD (gastroesophageal reflux disease)   . Diverticulosis   . Glaucoma   . Personal history of tobacco use, presenting hazards to health   . Breast screening, unspecified     atypical ductal hyperplasia, declined excision.  . Lump or mass in breast   . Special screening for malignant neoplasms, colon   . Bowel trouble   . Colon polyp 2011    Past Surgical History  Procedure Laterality Date  . Colonoscopy  08/20/2009    Tubular adenoma x2.  Dr. Dionne Milo  . Breast biopsy  12/14/11    The patient underwent a Finesse biopsy of the right breast December 14, 2011. This was completed as her FNA was inconclusive, with atypical ductal cells identified.    Family History  Problem Relation Age of Onset  . Heart disease Mother   . COPD Mother   . Hypertension Mother   . Cancer Father     metastatic prostate CA  . Stroke Sister     hemorrhagic CVA  . Heart disease Sister     restrictive  cardiomyopathy    Social History History  Substance Use Topics  . Smoking status: Former Smoker -- 1.00 packs/day for 20 years    Types: Cigarettes    Quit date: 05/15/2001  . Smokeless tobacco: Never Used  . Alcohol Use: 3.6 oz/week    6 Cans of beer per week    No Known Allergies  Current Outpatient Prescriptions  Medication Sig Dispense Refill  . bismuth subsalicylate (PEPTO BISMOL) 262 MG chewable tablet Chew 524 mg by mouth as needed.    . dicyclomine (BENTYL) 20 MG tablet TAKE 1 TABLET BY MOUTH THREE TIMES DAILY BEFORE MEALS 90 tablet 0  . omeprazole (PRILOSEC) 20 MG capsule Take 20 mg by mouth 2 (two) times daily before a meal.    . Probiotic Product ([place]) CAPS Take by mouth daily.    . Simethicone (GAS-X PO) Take by mouth.     No current facility-administered medications for this visit.    Review of Systems Review of Systems  Constitutional: Negative.   Respiratory: Negative.   Cardiovascular: Negative.   Gastrointestinal: Positive for constipation. Negative for nausea, vomiting and diarrhea.    Blood pressure 152/80, pulse 82, resp. rate 14, height 5' (1.524 m), weight 130 lb (58.968 kg).  Physical Exam Physical Exam  Constitutional: She is oriented to person, place,  and time. She appears well-developed and well-nourished.  Neck: Neck supple.  Cardiovascular: Normal rate, regular rhythm and normal heart sounds.   Pulmonary/Chest: Effort normal and breath sounds normal. Right breast exhibits no inverted nipple, no mass, no nipple discharge, no skin change and no tenderness. Left breast exhibits no inverted nipple, no mass, no nipple discharge, no skin change and no tenderness.  Abdominal: Soft. Normal appearance. There is tenderness in the right lower quadrant and left lower quadrant.  right lower quadrant tenderness > left lower quadrant abdominal tendernaess   Lymphadenopathy:    She has no cervical adenopathy.    She has no axillary  adenopathy.  Neurological: She is alert and oriented to person, place, and time.  Skin: Skin is warm and dry.    Data Reviewed 2013 CT reviewed.  2011 pathology from her colonoscopy and upper endoscopy showed tubular adenomas in the descending colon and transverse colon. Sigmoid colon showed a hyperplastic polyp. Gastric antrum  Showed no pathologic diagnosis. Procedure completed by Dr.Iftikhar. Repeat exam in 2016 appropriate.  Bilateral mammograms dated 07/12/2013 showed no interval change. Rated BI-RADS-4 on past history of ADH without excision.  April 2015 laboratory studies reviewed.  Assessment    Benign breast exam.  Abdominal pain likely secondary to IBS with significant home stressors.    Plan        Colonoscopy due 2016. Follow up with bilateral screening mammogram and office visit in one year. Consider adding fiber supplement  PCP:  Mattie Marlin 01/25/2014, 8:50 PM

## 2014-01-25 ENCOUNTER — Encounter: Payer: Self-pay | Admitting: General Surgery

## 2014-01-25 DIAGNOSIS — Z86010 Personal history of colonic polyps: Secondary | ICD-10-CM | POA: Insufficient documentation

## 2014-01-28 ENCOUNTER — Ambulatory Visit: Payer: Medicare Other | Admitting: General Surgery

## 2014-07-19 ENCOUNTER — Other Ambulatory Visit: Payer: Self-pay | Admitting: Internal Medicine

## 2014-07-19 MED ORDER — DICYCLOMINE HCL 20 MG PO TABS: ORAL_TABLET | ORAL | Status: DC

## 2014-07-19 NOTE — Telephone Encounter (Signed)
Refill one 30 days only.  Has not been seen in one year so he needs a  30 minute visit.

## 2014-07-19 NOTE — Telephone Encounter (Signed)
Last OV 4.17.15, last refill 9.8.15.  Please advise refill

## 2014-07-19 NOTE — Telephone Encounter (Signed)
Left detailed message on VM to return call to schedule appoint

## 2014-07-31 ENCOUNTER — Ambulatory Visit (INDEPENDENT_AMBULATORY_CARE_PROVIDER_SITE_OTHER): Payer: Medicare Other | Admitting: Internal Medicine

## 2014-07-31 ENCOUNTER — Encounter: Payer: Self-pay | Admitting: Internal Medicine

## 2014-07-31 VITALS — BP 128/74 | HR 75 | Temp 98.4°F | Resp 14 | Ht 60.0 in | Wt 135.5 lb

## 2014-07-31 DIAGNOSIS — E785 Hyperlipidemia, unspecified: Secondary | ICD-10-CM

## 2014-07-31 DIAGNOSIS — Z23 Encounter for immunization: Secondary | ICD-10-CM

## 2014-07-31 DIAGNOSIS — E559 Vitamin D deficiency, unspecified: Secondary | ICD-10-CM

## 2014-07-31 DIAGNOSIS — R5383 Other fatigue: Secondary | ICD-10-CM | POA: Diagnosis not present

## 2014-07-31 DIAGNOSIS — J449 Chronic obstructive pulmonary disease, unspecified: Secondary | ICD-10-CM

## 2014-07-31 DIAGNOSIS — Z86010 Personal history of colonic polyps: Secondary | ICD-10-CM

## 2014-07-31 DIAGNOSIS — K760 Fatty (change of) liver, not elsewhere classified: Secondary | ICD-10-CM | POA: Diagnosis not present

## 2014-07-31 DIAGNOSIS — N62 Hypertrophy of breast: Secondary | ICD-10-CM

## 2014-07-31 DIAGNOSIS — N6099 Unspecified benign mammary dysplasia of unspecified breast: Secondary | ICD-10-CM

## 2014-07-31 DIAGNOSIS — E7849 Other hyperlipidemia: Secondary | ICD-10-CM

## 2014-07-31 LAB — VITAMIN D 25 HYDROXY (VIT D DEFICIENCY, FRACTURES): VITD: 31.32 ng/mL (ref 30.00–100.00)

## 2014-07-31 LAB — CBC WITH DIFFERENTIAL/PLATELET
BASOS PCT: 0.5 % (ref 0.0–3.0)
Basophils Absolute: 0 10*3/uL (ref 0.0–0.1)
EOS ABS: 0.1 10*3/uL (ref 0.0–0.7)
Eosinophils Relative: 1.1 % (ref 0.0–5.0)
HCT: 36.6 % (ref 36.0–46.0)
Hemoglobin: 12.6 g/dL (ref 12.0–15.0)
Lymphocytes Relative: 26.3 % (ref 12.0–46.0)
Lymphs Abs: 1.5 10*3/uL (ref 0.7–4.0)
MCHC: 34.3 g/dL (ref 30.0–36.0)
MCV: 84 fl (ref 78.0–100.0)
Monocytes Absolute: 0.3 10*3/uL (ref 0.1–1.0)
Monocytes Relative: 5.8 % (ref 3.0–12.0)
NEUTROS PCT: 66.3 % (ref 43.0–77.0)
Neutro Abs: 3.8 10*3/uL (ref 1.4–7.7)
PLATELETS: 233 10*3/uL (ref 150.0–400.0)
RBC: 4.36 Mil/uL (ref 3.87–5.11)
RDW: 13 % (ref 11.5–15.5)
WBC: 5.7 10*3/uL (ref 4.0–10.5)

## 2014-07-31 LAB — LIPID PANEL
CHOLESTEROL: 254 mg/dL — AB (ref 0–200)
HDL: 54.1 mg/dL (ref >39.00)
LDL CALC: 164 mg/dL — AB (ref 0–99)
NonHDL: 199.9
Total CHOL/HDL Ratio: 5
Triglycerides: 182 mg/dL — ABNORMAL HIGH (ref 0.0–149.0)
VLDL: 36.4 mg/dL (ref 0.0–40.0)

## 2014-07-31 LAB — COMPREHENSIVE METABOLIC PANEL
ALBUMIN: 4.6 g/dL (ref 3.5–5.2)
ALT: 21 U/L (ref 0–35)
AST: 24 U/L (ref 0–37)
Alkaline Phosphatase: 101 U/L (ref 39–117)
BUN: 13 mg/dL (ref 6–23)
CHLORIDE: 103 meq/L (ref 96–112)
CO2: 29 mEq/L (ref 19–32)
Calcium: 9.8 mg/dL (ref 8.4–10.5)
Creatinine, Ser: 0.78 mg/dL (ref 0.40–1.20)
GFR: 78.07 mL/min (ref >60.00)
Glucose, Bld: 95 mg/dL (ref 70–99)
Potassium: 4.2 mEq/L (ref 3.5–5.1)
Sodium: 139 mEq/L (ref 135–145)
Total Bilirubin: 0.4 mg/dL (ref 0.2–1.2)
Total Protein: 7.2 g/dL (ref 6.0–8.3)

## 2014-07-31 LAB — TSH: TSH: 1.27 u[IU]/mL (ref 0.35–4.50)

## 2014-07-31 MED ORDER — DICYCLOMINE HCL 20 MG PO TABS: ORAL_TABLET | ORAL | Status: DC

## 2014-07-31 NOTE — Progress Notes (Signed)
Subjective:  Patient ID: Sharon Webster, female    DOB: 1945-04-20  Age: 69 y.o. MRN: [id number]  CC: Follow-up  The primary encounter diagnosis was Hepatic steatosis. Diagnoses of Familial hyperlipidemia, high LDL, Vitamin D deficiency, Other fatigue, Chronic obstructive pulmonary disease, unspecified COPD, unspecified chronic bronchitis type, History of colonic polyps, and Atypical ductal hyperplasia of breast were also pertinent to this visit.   HPI Sharon Webster presents for follow up on elevated liver enzymes consistent with fatty liver , hyperlipidemia, and IBS. LAST SEEN ONE YEAR AGO   "IVE PUT MY LIFE ON HOLD." SINCE BOYFRIEND HAD MASSIVE MI AND CVA and spent the last year recovering from a 3 month coma.   HAD BILATERAL BREAST BIOPSIES IN November BY [facility] which were benign.  Follow up mammogramns are being done at [facility].   Fatty Liver discussed:  She rarely uses ETOH AND TYLENOL DOESN'T USE EITHER VERY OFTEN    Outpatient Prescriptions Prior to Visit  Medication Sig Dispense Refill  . bismuth subsalicylate (PEPTO BISMOL) 262 MG chewable tablet Chew 524 mg by mouth as needed.    Marland Kitchen omeprazole (PRILOSEC) 20 MG capsule Take 20 mg by mouth 2 (two) times daily before a meal.    . Probiotic Product ([place]) CAPS Take by mouth daily.    . Simethicone (GAS-X PO) Take by mouth.    . dicyclomine (BENTYL) 20 MG tablet TAKE 1 TABLET BY MOUTH THREE TIMES DAILY BEFORE MEALS 90 tablet 0   No facility-administered medications prior to visit.    Review of Systems   Patient denies headache, fevers, malaise, unintentional weight loss, skin rash, eye pain, sinus congestion and sinus pain, sore throat, dysphagia,  hemoptysis , cough, dyspnea, wheezing, chest pain, palpitations, orthopnea, edema, abdominal pain, nausea, melena, diarrhea, constipation, flank pain, dysuria, hematuria, urinary  Frequency, nocturia, numbness, tingling, seizures,  Focal weakness, Loss  of consciousness,  Tremor, insomnia, depression, anxiety, and suicidal ideation.     Objective:  BP 128/74 mmHg  Pulse 75  Temp(Src) 98.4 F (36.9 C) (Oral)  Resp 14  Ht 5' (1.524 m)  Wt 135 lb 8 oz (61.462 kg)  BMI 26.46 kg/m2  SpO2 99%  BP Readings from Last 3 Encounters:  07/31/14 128/74  01/24/14 152/80  07/25/13 138/78    Wt Readings from Last 3 Encounters:  07/31/14 135 lb 8 oz (61.462 kg)  01/24/14 130 lb (58.968 kg)  07/25/13 134 lb (60.782 kg)    Physical Exam  General appearance: alert, cooperative and appears stated age Ears: normal TM's and external ear canals both ears Throat: lips, mucosa, and tongue normal; teeth and gums normal Neck: no adenopathy, no carotid bruit, supple, symmetrical, trachea midline and thyroid not enlarged, symmetric, no tenderness/mass/nodules Back: symmetric, no curvature. ROM normal. No CVA tenderness. Lungs: clear to auscultation bilaterally Heart: regular rate and rhythm, S1, S2 normal, no murmur, click, rub or gallop Abdomen: soft, non-tender; bowel sounds normal; no masses,  no organomegaly Pulses: 2+ and symmetric Skin: Skin color, texture, turgor normal. No rashes or lesions Lymph nodes: Cervical, supraclavicular, and axillary nodes normal.   No results found for: HGBA1C  Lab Results  Component Value Date   CREATININE 0.78 07/31/2014   CREATININE 0.9 06/19/2013   CREATININE 0.75 10/21/2011    Lab Results  Component Value Date   WBC 5.7 07/31/2014   HGB 12.6 07/31/2014   HCT 36.6 07/31/2014   PLT 233.0 07/31/2014   GLUCOSE 95 07/31/2014   CHOL 254*  07/31/2014   TRIG 182.0* 07/31/2014   HDL 54.10 07/31/2014   LDLDIRECT 162.8 10/15/2011   LDLCALC 164* 07/31/2014   ALT 21 07/31/2014   AST 24 07/31/2014   NA 139 07/31/2014   K 4.2 07/31/2014   CL 103 07/31/2014   CREATININE 0.78 07/31/2014   BUN 13 07/31/2014   CO2 29 07/31/2014   TSH 1.27 07/31/2014    No results found.  Assessment & Plan:   Problem  List Items Addressed This Visit    COPD (chronic obstructive pulmonary disease)    She remains asymptomatic and has not smoked in over 3 years.       Familial hyperlipidemia, high LDL     Based on current lipid profile and smoking history , the risk of clinically significant CAD is 11 to 18% over the next 10 years, using the Framingham risk calculator. The [employer] recommends starting patients aged 73 or higher on moderate intensity statin therapy for LDL between 70-189 and 10 yr risk of CAD > 7.5% ;  Will advise patient to start mod potency statin   Lab Results  Component Value Date   CHOL 254* 07/31/2014   HDL 54.10 07/31/2014   LDLCALC 164* 07/31/2014   LDLDIRECT 162.8 10/15/2011   TRIG 182.0* 07/31/2014   CHOLHDL 5 07/31/2014          Relevant Orders   Lipid panel (Completed)   Hepatic steatosis - Primary    Presumed by ultrasound changes and serologies negative for autoimmune causes of hepatitis.  Current liver enzymes are normal and all modifiable risk factors including obesity, diabetes and hyperlipidemia have been addressed       Relevant Orders   Comprehensive metabolic panel (Completed)   Hepatitis A hepatitis B combined vaccine IM (Completed)   Atypical ductal hyperplasia of breast    By prior biopsy,  With annual screening to be continued by [facility] and [facility]       History of colonic polyps    For colonoscopy this year  JB>        Other Visit Diagnoses    Vitamin D deficiency        Relevant Orders    Vit D  25 hydroxy (rtn osteoporosis monitoring) (Completed)    Other fatigue        Relevant Orders    CBC with Differential/Platelet (Completed)    TSH (Completed)       I am having Ms. Spinello maintain her Simethicone (GAS-X PO), omeprazole, [facility]ILLIPS COLON HEALTH, bismuth subsalicylate, etodolac, and dicyclomine.  Meds ordered this encounter  Medications  . etodolac (LODINE) 500 MG tablet    Sig: Take 500 mg by mouth 2 (two)  times daily.  Marland Kitchen dicyclomine (BENTYL) 20 MG tablet    Sig: TAKE 1 TABLET BY MOUTH THREE TIMES DAILY BEFORE MEALS    Dispense:  90 tablet    Refill:  3    Medications Discontinued During This Encounter  Medication Reason  . dicyclomine (BENTYL) 20 MG tablet Reorder   A total of 40 minutes was spent with patient more than half of which was spent in counseling patient on the above mentioned issues , reviewing and explaining recent labs and imaging studies done, and coordination of care.   Follow-up: Return in about 6 months (around 01/31/2015).   Crecencio Mc, MD

## 2014-07-31 NOTE — Patient Instructions (Signed)
yOU NEED TO COMPLETE THE VACCINATION SERIES FOR HEPATITIS A AND B TO PROTECT YOUR LIVER  RETURN IN ONE MONTH FOR SHOT #2   RETURN IN 6 MONTHS FOR ANNUAL EXAM WITH PAP SMEAR AND SHOT #3

## 2014-07-31 NOTE — Progress Notes (Signed)
Pre-visit discussion using our clinic review tool. No additional management support is needed unless otherwise documented below in the visit note.  

## 2014-08-02 ENCOUNTER — Encounter: Payer: Self-pay | Admitting: *Deleted

## 2014-08-03 ENCOUNTER — Telehealth: Payer: Self-pay | Admitting: Internal Medicine

## 2014-08-03 DIAGNOSIS — Z79899 Other long term (current) drug therapy: Secondary | ICD-10-CM

## 2014-08-03 DIAGNOSIS — E785 Hyperlipidemia, unspecified: Secondary | ICD-10-CM

## 2014-08-03 NOTE — Telephone Encounter (Signed)
i would prefer that only "normal labs" be sent via letter ;  Sharon Webster was advised to consider starting at statin,  Which is likely to get ignored in the letter that was sent,  So please call her to follow up per previous telephone message that didn't get routed. Thanks !

## 2014-08-03 NOTE — Assessment & Plan Note (Signed)
She remains asymptomatic and has not smoked in over 3 years.

## 2014-08-03 NOTE — Telephone Encounter (Signed)
Based on her current lipid profile, the risk of clinically significant CAD is 10 to 20 over the next 10 years, using the Framingham risk calculator. The [employer] recommends starting patients aged 68 or higher on moderate intensity statin therapy for LDL between 70-189 and 10 yr risk of CAD > 7.5% ;  It is also highly recommended for patients with fatty liver to prevent progression to cirrhosis.  I would like her to consider starting a statin like simvastatin for this reason

## 2014-08-03 NOTE — Assessment & Plan Note (Addendum)
Based on current lipid profile and smoking history , the risk of clinically significant CAD is 11 to 18% over the next 10 years, using the Framingham risk calculator. The [employer] recommends starting patients aged 68 or higher on moderate intensity statin therapy for LDL between 70-189 and 10 yr risk of CAD > 7.5% ;  Will advise patient to start mod potency statin   Lab Results  Component Value Date   CHOL 254* 07/31/2014   HDL 54.10 07/31/2014   LDLCALC 164* 07/31/2014   LDLDIRECT 162.8 10/15/2011   TRIG 182.0* 07/31/2014   CHOLHDL 5 07/31/2014

## 2014-08-03 NOTE — Assessment & Plan Note (Signed)
For colonoscopy this year  JB>

## 2014-08-03 NOTE — Assessment & Plan Note (Signed)
By prior biopsy,  With annual screening to be continued by J[facility] and [facility]

## 2014-08-03 NOTE — Assessment & Plan Note (Signed)
Presumed by ultrasound changes and serologies negative for autoimmune causes of hepatitis.  Current liver enzymes are normal and all modifiable risk factors including obesity, diabetes and hyperlipidemia have been addressed  

## 2014-08-05 MED ORDER — PRAVASTATIN SODIUM 20 MG PO TABS: 20.0000 mg | ORAL_TABLET | Freq: Every day | ORAL | Status: DC

## 2014-08-05 NOTE — Telephone Encounter (Signed)
rx sent. Please ask patient to return in 6 weeks for fasting labs only .

## 2014-08-05 NOTE — Telephone Encounter (Signed)
Patient would be willing to start Pravastatin as advised.

## 2014-08-05 NOTE — Telephone Encounter (Signed)
Left message for patient to return call to office. 

## 2014-08-06 NOTE — Telephone Encounter (Signed)
Follow up lab appointment made. For 09/10/14

## 2014-09-03 ENCOUNTER — Ambulatory Visit (INDEPENDENT_AMBULATORY_CARE_PROVIDER_SITE_OTHER): Payer: Medicare Other

## 2014-09-03 DIAGNOSIS — Z1159 Encounter for screening for other viral diseases: Secondary | ICD-10-CM | POA: Diagnosis not present

## 2014-09-03 DIAGNOSIS — Z23 Encounter for immunization: Secondary | ICD-10-CM | POA: Diagnosis not present

## 2014-09-10 ENCOUNTER — Other Ambulatory Visit (INDEPENDENT_AMBULATORY_CARE_PROVIDER_SITE_OTHER): Payer: Medicare Other

## 2014-09-10 DIAGNOSIS — Z79899 Other long term (current) drug therapy: Secondary | ICD-10-CM

## 2014-09-10 DIAGNOSIS — E785 Hyperlipidemia, unspecified: Secondary | ICD-10-CM | POA: Diagnosis not present

## 2014-09-10 LAB — LIPID PANEL
Cholesterol: 184 mg/dL (ref 0–200)
HDL: 51.3 mg/dL (ref >39.00)
LDL Cholesterol: 101 mg/dL — ABNORMAL HIGH (ref 0–99)
NonHDL: 132.7
Total CHOL/HDL Ratio: 4
Triglycerides: 157 mg/dL — ABNORMAL HIGH (ref 0.0–149.0)
VLDL: 31.4 mg/dL (ref 0.0–40.0)

## 2014-09-10 LAB — COMPREHENSIVE METABOLIC PANEL
ALT: 23 U/L (ref 0–35)
AST: 21 U/L (ref 0–37)
Albumin: 4.4 g/dL (ref 3.5–5.2)
Alkaline Phosphatase: 98 U/L (ref 39–117)
BILIRUBIN TOTAL: 0.5 mg/dL (ref 0.2–1.2)
BUN: 17 mg/dL (ref 6–23)
CO2: 31 mEq/L (ref 19–32)
CREATININE: 0.94 mg/dL (ref 0.40–1.20)
Calcium: 9.6 mg/dL (ref 8.4–10.5)
Chloride: 104 mEq/L (ref 96–112)
GFR: 62.93 mL/min (ref >60.00)
Glucose, Bld: 114 mg/dL — ABNORMAL HIGH (ref 70–99)
Potassium: 4.1 mEq/L (ref 3.5–5.1)
Sodium: 141 mEq/L (ref 135–145)
TOTAL PROTEIN: 7.1 g/dL (ref 6.0–8.3)

## 2014-09-13 ENCOUNTER — Encounter: Payer: Self-pay | Admitting: *Deleted

## 2014-09-13 ENCOUNTER — Telehealth: Payer: Self-pay | Admitting: Internal Medicine

## 2014-09-13 NOTE — Telephone Encounter (Signed)
Patient notified of resulst and to ignore weight loss but to follow a low glycemic diet.

## 2014-09-13 NOTE — Telephone Encounter (Signed)
See prior message with following Correction:  Weight loss is NOT needed,   Weight is fine.  Just start exercising and following a low GI diet

## 2015-01-09 ENCOUNTER — Encounter: Payer: Self-pay | Admitting: General Surgery

## 2015-01-15 ENCOUNTER — Ambulatory Visit: Payer: Self-pay | Admitting: General Surgery

## 2015-01-27 ENCOUNTER — Encounter: Payer: Self-pay | Admitting: General Surgery

## 2015-01-31 ENCOUNTER — Other Ambulatory Visit (HOSPITAL_COMMUNITY)
Admission: RE | Admit: 2015-01-31 | Discharge: 2015-01-31 | Disposition: A | Discharge status: Home / Self Care | Payer: Medicare Other | Source: Ambulatory Visit | Attending: Internal Medicine | Admitting: Internal Medicine

## 2015-01-31 ENCOUNTER — Ambulatory Visit (INDEPENDENT_AMBULATORY_CARE_PROVIDER_SITE_OTHER): Payer: Medicare Other | Admitting: Internal Medicine

## 2015-01-31 ENCOUNTER — Encounter: Payer: Self-pay | Admitting: Internal Medicine

## 2015-01-31 VITALS — BP 136/72 | HR 81 | Temp 98.4°F | Wt 134.3 lb

## 2015-01-31 DIAGNOSIS — Z23 Encounter for immunization: Secondary | ICD-10-CM | POA: Diagnosis not present

## 2015-01-31 DIAGNOSIS — Z113 Encounter for screening for infections with a predominantly sexual mode of transmission: Secondary | ICD-10-CM | POA: Diagnosis not present

## 2015-01-31 DIAGNOSIS — E785 Hyperlipidemia, unspecified: Secondary | ICD-10-CM

## 2015-01-31 DIAGNOSIS — R87810 Cervical high risk human papillomavirus (HPV) DNA test positive: Secondary | ICD-10-CM | POA: Insufficient documentation

## 2015-01-31 DIAGNOSIS — R8789 Other abnormal findings in specimens from female genital organs: Secondary | ICD-10-CM

## 2015-01-31 DIAGNOSIS — Z86010 Personal history of colonic polyps: Secondary | ICD-10-CM | POA: Diagnosis not present

## 2015-01-31 DIAGNOSIS — Z01419 Encounter for gynecological examination (general) (routine) without abnormal findings: Secondary | ICD-10-CM | POA: Diagnosis present

## 2015-01-31 DIAGNOSIS — Z0000 Encounter for general adult medical examination without abnormal findings: Secondary | ICD-10-CM | POA: Diagnosis not present

## 2015-01-31 DIAGNOSIS — Z1151 Encounter for screening for human papillomavirus (HPV): Secondary | ICD-10-CM | POA: Diagnosis present

## 2015-01-31 DIAGNOSIS — K760 Fatty (change of) liver, not elsewhere classified: Secondary | ICD-10-CM | POA: Diagnosis not present

## 2015-01-31 DIAGNOSIS — R87618 Other abnormal cytological findings on specimens from cervix uteri: Secondary | ICD-10-CM

## 2015-01-31 DIAGNOSIS — E7849 Other hyperlipidemia: Secondary | ICD-10-CM

## 2015-01-31 DIAGNOSIS — B977 Papillomavirus as the cause of diseases classified elsewhere: Secondary | ICD-10-CM

## 2015-01-31 LAB — COMPREHENSIVE METABOLIC PANEL
ALBUMIN: 4.6 g/dL (ref 3.5–5.2)
ALT: 16 U/L (ref 0–35)
AST: 17 U/L (ref 0–37)
Alkaline Phosphatase: 100 U/L (ref 39–117)
BILIRUBIN TOTAL: 0.5 mg/dL (ref 0.2–1.2)
BUN: 14 mg/dL (ref 6–23)
CHLORIDE: 103 meq/L (ref 96–112)
CO2: 30 mEq/L (ref 19–32)
Calcium: 9.8 mg/dL (ref 8.4–10.5)
Creatinine, Ser: 0.83 mg/dL (ref 0.40–1.20)
GFR: 72.56 mL/min (ref >60.00)
Glucose, Bld: 104 mg/dL — ABNORMAL HIGH (ref 70–99)
Potassium: 3.7 mEq/L (ref 3.5–5.1)
Sodium: 140 mEq/L (ref 135–145)
TOTAL PROTEIN: 7.4 g/dL (ref 6.0–8.3)

## 2015-01-31 LAB — LIPID PANEL
CHOLESTEROL: 193 mg/dL (ref 0–200)
HDL: 52.4 mg/dL (ref >39.00)
LDL CALC: 109 mg/dL — AB (ref 0–99)
NonHDL: 141.06
TRIGLYCERIDES: 158 mg/dL — AB (ref 0.0–149.0)
Total CHOL/HDL Ratio: 4
VLDL: 31.6 mg/dL (ref 0.0–40.0)

## 2015-01-31 NOTE — Patient Instructions (Addendum)
We repeated your PAP smear today because your HPV was positive in 2013 and therefore requires a repeat until it clears up  You received the Prevnar pneumonia vaccine;  You can return in a week for your last Hepatitis A/B vaccine   We will get you set up for your colonoscopy and your wellness exam

## 2015-01-31 NOTE — Progress Notes (Signed)
Patient ID: Sharon Webster, female    DOB: 01/25/1946  Age: 69 y.o. MRN: [id number]  The patient is here for annual preventive   examination and management of other chronic and acute problems.   [facility] Jan 24 2015  Sees Byrnett Monday for breast exam.  Prior biopsies  Colonoscopy polyps 2011.  Sharon Webster did last  Last PAP 2013,  Atrophy,  HPV positive,  Needs one today    The risk factors are reflected in the social history.  The roster of all physicians providing medical care to patient - is listed in the Snapshot section of the chart.   Home safety : The patient has smoke detectors in the home. They wear seatbelts.  There are no firearms at home. There is no violence in the home.   There is no risks for hepatitis, STDs or HIV. There is no   history of blood transfusion. They have no travel history to infectious disease endemic areas of the world.  The patient has seen their dentist in the last six month. They have seen their eye doctor in the last year. They admit to slight hearing difficulty with regard to whispered voices and some television programs.  They have deferred audiologic testing in the last year.  They do not  have excessive sun exposure. Discussed the need for sun protection: hats, long sleeves and use of sunscreen if there is significant sun exposure.   Diet: the importance of a healthy diet is discussed. They do have a healthy diet.  The benefits of regular aerobic exercise were discussed. She does not exercise regularly .   Depression screen: there are no signs or vegative symptoms of depression- irritability, change in appetite, anhedonia, sadness/tearfullness.  The following portions of the patient's history were reviewed and updated as appropriate: allergies, current medications, past family history, past medical history,  past surgical history, past social history  and problem list.  Visual acuity was not assessed per patient preference since she has regular  follow up with her ophthalmologist. Hearing and body mass index were assessed and reviewed.   During the course of the visit the patient was educated and counseled about appropriate screening and preventive services including : fall prevention , diabetes screening, nutrition counseling, colorectal cancer screening, and recommended immunizations.    CC: The primary encounter diagnosis was Papanicolaou smear of cervix with positive high risk human papilloma virus (HPV) test. Diagnoses of Encounter for preventive health examination, Screen for STD (sexually transmitted disease), Hepatic steatosis, Need for prophylactic vaccination and inoculation against influenza, Need for 23-polyvalent pneumococcal polysaccharide vaccine, Personal history of colonic polyps, Familial hyperlipidemia, high LDL, and Pap smear abnormality of cervix/human papillomavirus (HPV) positive were also pertinent to this visit.  History Sharon Webster has a past medical history of Duodenitis; COPD (chronic obstructive pulmonary disease) ([place]); GERD (gastroesophageal reflux disease); Diverticulosis; Glaucoma; Personal history of tobacco use, presenting hazards to health; Breast screening, unspecified; Lump or mass in breast; Special screening for malignant neoplasms, colon; Bowel trouble; and Colon polyp (2011).   She has past surgical history that includes Colonoscopy (08/20/2009) and Breast biopsy (12/14/11).   Her family history includes COPD in her mother; Cancer in her father; Heart disease in her mother and sister; Hypertension in her mother; Stroke in her sister.She reports that she quit smoking about 13 years ago. Her smoking use included Cigarettes. She has a 20 pack-year smoking history. She has never used smokeless tobacco. She reports that she drinks about 3.6 oz of alcohol per  week. She reports that she does not use illicit drugs.  Outpatient Prescriptions Prior to Visit  Medication Sig Dispense Refill  . dicyclomine (BENTYL)  20 MG tablet TAKE 1 TABLET BY MOUTH THREE TIMES DAILY BEFORE MEALS 90 tablet 3  . omeprazole (PRILOSEC) 20 MG capsule Take 20 mg by mouth 2 (two) times daily before a meal.    . pravastatin (PRAVACHOL) 20 MG tablet Take 1 tablet (20 mg total) by mouth daily. 90 tablet 3  . bismuth subsalicylate (PEPTO BISMOL) 262 MG chewable tablet Chew 524 mg by mouth as needed.    . etodolac (LODINE) 500 MG tablet Take 500 mg by mouth 2 (two) times daily.    . Probiotic Product ([place]) CAPS Take by mouth daily.    . Simethicone (GAS-X PO) Take by mouth.     No facility-administered medications prior to visit.    Review of Systems   Patient denies headache, fevers, malaise, unintentional weight loss, skin rash, eye pain, sinus congestion and sinus pain, sore throat, dysphagia,  hemoptysis , cough, dyspnea, wheezing, chest pain, palpitations, orthopnea, edema, abdominal pain, nausea, melena, diarrhea, constipation, flank pain, dysuria, hematuria, urinary  Frequency, nocturia, numbness, tingling, seizures,  Focal weakness, Loss of consciousness,  Tremor, insomnia, depression, anxiety, and suicidal ideation.     Objective:  BP 136/72 mmHg  Pulse 81  Temp(Src) 98.4 F (36.9 C) (Oral)  Wt 134 lb 4.8 oz (60.918 kg)  SpO2 99%  Physical Exam  General Appearance:    Alert, cooperative, no distress, appears stated age  Head:    Normocephalic, without obvious abnormality, atraumatic  Eyes:    PERRL, conjunctiva/corneas clear, EOM's intact, fundi    benign, both eyes  Ears:    Normal TM's and external ear canals, both ears  Nose:   Nares normal, septum midline, mucosa normal, no drainage    or sinus tenderness  Throat:   Lips, mucosa, and tongue normal; teeth and gums normal  Neck:   Supple, symmetrical, trachea midline, no adenopathy;    thyroid:  no enlargement/tenderness/nodules; no carotid   bruit or JVD  Back:     Symmetric, no curvature, ROM normal, no CVA tenderness  Lungs:     Clear  to auscultation bilaterally, respirations unlabored  Chest Wall:    No tenderness or deformity   Heart:    Regular rate and rhythm, S1 and S2 normal, no murmur, rub   or gallop  Breast Exam:    No tenderness, masses, or nipple abnormality  Abdomen:     Soft, non-tender, bowel sounds active all four quadrants,    no masses, no organomegaly  Genitalia:    Pelvic: cervix normal in appearance, external genitalia normal, no adnexal masses or tenderness, no cervical motion tenderness, rectovaginal septum normal, uterus normal size, shape, and consistency and vagina normal without discharge  Extremities:   Extremities normal, atraumatic, no cyanosis or edema  Pulses:   2+ and symmetric all extremities  Skin:   Skin color, texture, turgor normal, no rashes or lesions  Lymph nodes:   Cervical, supraclavicular, and axillary nodes normal  Neurologic:   CNII-XII intact, normal strength, sensation and reflexes    throughout     Assessment & Plan:   Problem List Items Addressed This Visit    Familial hyperlipidemia, high LDL     Based on current lipid profile and smoking history , the risk of clinically significant CAD is 11 to 18% over the next 10 years, using the  [place] risk calculator. The [employer] recommends starting patients aged 83 or higher on moderate intensity statin therapy for LDL between 70-189 and 10 yr risk of CAD > 7.5% ;  She is taking pravastatin .    Lab Results  Component Value Date   CHOL 193 01/31/2015   HDL 52.40 01/31/2015   LDLCALC 109* 01/31/2015   LDLDIRECT 162.8 10/15/2011   TRIG 158.0* 01/31/2015   CHOLHDL 4 01/31/2015            Relevant Medications   aspirin 81 MG tablet   Hepatic steatosis    Presumed by ultrasound changes and serologies negative for autoimmune causes of hepatitis.  Current liver enzymes are normal and all modifiable risk factors including obesity, diabetes and hyperlipidemia have been addressed .  Hepatitis A/B  vaccines are in process   Lab Results  Component Value Date   ALT 16 01/31/2015   AST 17 01/31/2015   ALKPHOS 100 01/31/2015   BILITOT 0.5 01/31/2015         Relevant Orders   Comprehensive metabolic panel (Completed)   Lipid panel (Completed)   Personal history of colonic polyps    Last colonoscopy ws 2011  Sharon Webster.  She is due for 5 yr follow up .  Will refer to [facility].       Relevant Orders   Ambulatory referral to General Surgery   RESOLVED: Papanicolaou smear of cervix with positive high risk human papilloma virus (HPV) test - Primary   Relevant Orders   Cytology - PAP   Encounter for preventive health examination    Annual comprehensive preventive exam was done as well as an evaluation and management of chronic conditions .  During the course of the visit the patient was educated and counseled about appropriate screening and preventive services including :  diabetes screening, lipid analysis , nutrition counseling, colorectal cancer screening, and recommended immunizations.  Printed recommendations for health maintenance screenings was given.   Lab Results  Component Value Date   CHOL 193 01/31/2015   HDL 52.40 01/31/2015   LDLCALC 109* 01/31/2015   LDLDIRECT 162.8 10/15/2011   TRIG 158.0* 01/31/2015   CHOLHDL 4 01/31/2015         Pap smear abnormality of cervix/human papillomavirus (HPV) positive    Repeat PAP smear was done today       Other Visit Diagnoses    Screen for STD (sexually transmitted disease)        Relevant Orders    HIV antibody (Completed)    Hepatitis C antibody (Completed)    Need for prophylactic vaccination and inoculation against influenza        Relevant Orders    Flu Vaccine QUAD 36+ mos IM (Completed)    Need for 23-polyvalent pneumococcal polysaccharide vaccine        Relevant Orders    Pneumococcal conjugate vaccine 13-valent IM (Completed)       I am having Ms. Chalker maintain her Simethicone (GAS-X PO), omeprazole,  [facility]ON HEALTH, bismuth subsalicylate, etodolac, dicyclomine, pravastatin, and aspirin.  Meds ordered this encounter  Medications  . aspirin 81 MG tablet    Sig: Take 81 mg by mouth daily.    There are no discontinued medications.  Follow-up: No Follow-up on file.   Crecencio Mc, MD

## 2015-02-01 LAB — HEPATITIS C ANTIBODY: HCV AB: NEGATIVE

## 2015-02-01 LAB — HIV ANTIBODY (ROUTINE TESTING W REFLEX): HIV: NONREACTIVE

## 2015-02-02 ENCOUNTER — Encounter: Payer: Self-pay | Admitting: Internal Medicine

## 2015-02-02 DIAGNOSIS — R87618 Other abnormal cytological findings on specimens from cervix uteri: Secondary | ICD-10-CM

## 2015-02-02 DIAGNOSIS — R8789 Other abnormal findings in specimens from female genital organs: Secondary | ICD-10-CM | POA: Insufficient documentation

## 2015-02-02 HISTORY — DX: Other abnormal cytological findings on specimens from cervix uteri: R87.618

## 2015-02-02 NOTE — Assessment & Plan Note (Signed)
Last colonoscopy ws 2011  Iftikhar.  She is due for 5 yr follow up .  Will refer to [facility].

## 2015-02-02 NOTE — Assessment & Plan Note (Signed)
Based on current lipid profile and smoking history , the risk of clinically significant CAD is 11 to 18% over the next 10 years, using the Framingham risk calculator. The [employer] recommends starting patients aged 68 or higher on moderate intensity statin therapy for LDL between 70-189 and 10 yr risk of CAD > 7.5% ;  She is taking pravastatin .    Lab Results  Component Value Date   CHOL 193 01/31/2015   HDL 52.40 01/31/2015   LDLCALC 109* 01/31/2015   LDLDIRECT 162.8 10/15/2011   TRIG 158.0* 01/31/2015   CHOLHDL 4 01/31/2015

## 2015-02-02 NOTE — Assessment & Plan Note (Signed)
Presumed by ultrasound changes and serologies negative for autoimmune causes of hepatitis.  Current liver enzymes are normal and all modifiable risk factors including obesity, diabetes and hyperlipidemia have been addressed .  Hepatitis A/B vaccines are in process   Lab Results  Component Value Date   ALT 16 01/31/2015   AST 17 01/31/2015   ALKPHOS 100 01/31/2015   BILITOT 0.5 01/31/2015

## 2015-02-02 NOTE — Assessment & Plan Note (Signed)
Repeat PAP smear was done today  

## 2015-02-02 NOTE — Assessment & Plan Note (Signed)
Annual comprehensive preventive exam was done as well as an evaluation and management of chronic conditions .  During the course of the visit the patient was educated and counseled about appropriate screening and preventive services including :  diabetes screening, lipid analysis , nutrition counseling, colorectal cancer screening, and recommended immunizations.  Printed recommendations for health maintenance screenings was given.   Lab Results  Component Value Date   CHOL 193 01/31/2015   HDL 52.40 01/31/2015   LDLCALC 109* 01/31/2015   LDLDIRECT 162.8 10/15/2011   TRIG 158.0* 01/31/2015   CHOLHDL 4 01/31/2015

## 2015-02-03 ENCOUNTER — Ambulatory Visit (INDEPENDENT_AMBULATORY_CARE_PROVIDER_SITE_OTHER): Payer: Medicare Other | Admitting: General Surgery

## 2015-02-03 ENCOUNTER — Encounter: Payer: Self-pay | Admitting: General Surgery

## 2015-02-03 ENCOUNTER — Other Ambulatory Visit: Payer: Self-pay | Admitting: General Surgery

## 2015-02-03 VITALS — BP 130/80 | HR 80 | Resp 14 | Ht 60.0 in | Wt 134.0 lb

## 2015-02-03 DIAGNOSIS — Z86010 Personal history of colonic polyps: Secondary | ICD-10-CM | POA: Diagnosis not present

## 2015-02-03 DIAGNOSIS — N62 Hypertrophy of breast: Secondary | ICD-10-CM | POA: Diagnosis not present

## 2015-02-03 DIAGNOSIS — N6099 Unspecified benign mammary dysplasia of unspecified breast: Secondary | ICD-10-CM

## 2015-02-03 MED ORDER — POLYETHYLENE GLYCOL 3350 17 GM/SCOOP PO POWD: ORAL | Status: DC

## 2015-02-03 NOTE — Addendum Note (Signed)
Addended by: Dominga Ferry on: 02/03/2015 11:59 AM   Modules accepted: Orders

## 2015-02-03 NOTE — Patient Instructions (Addendum)
Colonoscopy A colonoscopy is an exam to look at the entire large intestine (colon). This exam can help find problems such as tumors, polyps, inflammation, and areas of bleeding. The exam takes about 1 hour.  LET [facility] CARE PROVIDER KNOW ABOUT:   Any allergies you have.  All medicines you are taking, including vitamins, herbs, eye drops, creams, and over-the-counter medicines.  Previous problems you or members of your family have had with the use of anesthetics.  Any blood disorders you have.  Previous surgeries you have had.  Medical conditions you have. RISKS AND COMPLICATIONS  Generally, this is a safe procedure. However, as with any procedure, complications can occur. Possible complications include:  Bleeding.  Tearing or rupture of the colon wall.  Reaction to medicines given during the exam.  Infection (rare). BEFORE THE PROCEDURE   Ask your health care provider about changing or stopping your regular medicines.  You may be prescribed an oral bowel prep. This involves drinking a large amount of medicated liquid, starting the day before your procedure. The liquid will cause you to have multiple loose stools until your stool is almost clear or light green. This cleans out your colon in preparation for the procedure.  Do not eat or drink anything else once you have started the bowel prep, unless your health care provider tells you it is safe to do so.  Arrange for someone to drive you home after the procedure. PROCEDURE   You will be given medicine to help you relax (sedative).  You will lie on your side with your knees bent.  A long, flexible tube with a light and camera on the end (colonoscope) will be inserted through the rectum and into the colon. The camera sends video back to a computer screen as it moves through the colon. The colonoscope also releases carbon dioxide gas to inflate the colon. This helps your health care provider see the area better.  During  the exam, your health care provider may take a small tissue sample (biopsy) to be examined under a microscope if any abnormalities are found.  The exam is finished when the entire colon has been viewed. AFTER THE PROCEDURE   Do not drive for 24 hours after the exam.  You may have a small amount of blood in your stool.  You may pass moderate amounts of gas and have mild abdominal cramping or bloating. This is caused by the gas used to inflate your colon during the exam.  Ask when your test results will be ready and how you will get your results. Make sure you get your test results.   This information is not intended to replace advice given to you by your health care provider. Make sure you discuss any questions you have with your health care provider.   Document Released: 02/27/2000 Document Revised: 12/20/2012 Document Reviewed: 11/06/2012 Elsevier Interactive Patient Education [employer].  Patient has been scheduled for a colonoscopy on 04-02-15 at [facility]. It is okay for patient to continue 81 mg aspirin once daily.

## 2015-02-03 NOTE — Addendum Note (Signed)
Addended by: Dominga Ferry on: 02/03/2015 12:00 PM   Modules accepted: Orders, Medications

## 2015-02-03 NOTE — Progress Notes (Signed)
Patient ID: Sharon Webster, female   DOB: [birth year], 69 y.o.   MRN: [id number]  Chief Complaint  Patient presents with  . Follow-up    mammogram    HPI Sharon Webster is a 69 y.o. female who presents for a breast evaluation. The most recent mammogram was done on 01/08/15 and added views on 01/24/15. Patient does perform regular self breast checks and gets regular mammograms done.  Patient is also do for her colonoscopy. No new GI or breast problems at this time. She does have a history of constipation and diarrhea.   Since the patient's last visit her boyfriend suffered a major myocardial event with CNS side effects. She is now living in her own home and he is living in an assisted facility. She finds this much less stressful.  HPI  Past Medical History  Diagnosis Date  . Duodenitis   . COPD (chronic obstructive pulmonary disease) ([place])   . GERD (gastroesophageal reflux disease)   . Diverticulosis   . Glaucoma   . Personal history of tobacco use, presenting hazards to health   . Breast screening, unspecified     atypical ductal hyperplasia, declined excision.  . Lump or mass in breast   . Special screening for malignant neoplasms, colon   . Bowel trouble   . Colon polyp 2011    Past Surgical History  Procedure Laterality Date  . Colonoscopy  08/20/2009    Tubular adenoma x2. Transverse and descending colon Dr. Dionne Milo  . Breast biopsy  12/14/11    The patient underwent a Finesse biopsy of the right breast December 14, 2011. This was completed as her FNA was inconclusive, with atypical ductal cells identified.    Family History  Problem Relation Age of Onset  . Heart disease Mother   . COPD Mother   . Hypertension Mother   . Cancer Father     metastatic prostate CA  . Stroke Sister     hemorrhagic CVA  . Heart disease Sister     restrictive cardiomyopathy    Social History Social History  Substance Use Topics  . Smoking status: Former Smoker -- 1.00  packs/day for 20 years    Types: Cigarettes    Quit date: 05/15/2001  . Smokeless tobacco: Never Used  . Alcohol Use: 3.6 oz/week    6 Cans of beer per week    No Known Allergies  Current Outpatient Prescriptions  Medication Sig Dispense Refill  . aspirin 81 MG tablet Take 81 mg by mouth daily.    Marland Kitchen bismuth subsalicylate (PEPTO BISMOL) 262 MG chewable tablet Chew 524 mg by mouth as needed.    . dicyclomine (BENTYL) 20 MG tablet TAKE 1 TABLET BY MOUTH THREE TIMES DAILY BEFORE MEALS 90 tablet 3  . etodolac (LODINE) 500 MG tablet Take 500 mg by mouth 2 (two) times daily.    Marland Kitchen omeprazole (PRILOSEC) 20 MG capsule Take 20 mg by mouth 2 (two) times daily before a meal.    . pravastatin (PRAVACHOL) 20 MG tablet Take 1 tablet (20 mg total) by mouth daily. 90 tablet 3  . Probiotic Product ([place]) CAPS Take by mouth daily.    . Simethicone (GAS-X PO) Take by mouth.     No current facility-administered medications for this visit.    Review of Systems Review of Systems  Constitutional: Negative.   Respiratory: Negative.   Cardiovascular: Negative.   Gastrointestinal: Positive for diarrhea and constipation. Negative for nausea, vomiting, abdominal pain, blood in stool,  abdominal distention, anal bleeding and rectal pain.    Blood pressure 130/80, pulse 80, resp. rate 14, height 5' (1.524 m), weight 134 lb (60.782 kg).  Physical Exam Physical Exam  Constitutional: She is oriented to person, place, and time. She appears well-developed and well-nourished.  Eyes: Conjunctivae are normal. No scleral icterus.  Neck: Neck supple.  Cardiovascular: Normal rate, regular rhythm and normal heart sounds.   Pulmonary/Chest: Effort normal and breath sounds normal. Right breast exhibits no inverted nipple, no mass, no nipple discharge, no skin change and no tenderness. Left breast exhibits no inverted nipple, no mass, no nipple discharge, no skin change and no tenderness.  Abdominal: Soft.  Normal appearance. There is no tenderness.  Lymphadenopathy:    She has no cervical adenopathy.    She has no axillary adenopathy.  Neurological: She is alert and oriented to person, place, and time.  Skin: Skin is warm and dry.  Psychiatric: She has a normal mood and affect.    Data Reviewed Mammograms of October and November 2016 were reviewed. Focal density resolved on focal spot compression. BI-RADS-2. The area of concern was approximately 2 cm away from the site of the previous biopsy of 2013 showing a microscopic foci of ADH.  Assessment    Benign breast exam.  Past history colonic polyp.    Plan       Patient will be asked to return to the office in one year with a bilateral screening mammogram.  Colonoscopy with possible biopsy/polypectomy prn: Information regarding the procedure, including its potential risks and complications (including but not limited to perforation of the bowel, which may require emergency surgery to repair, and bleeding) was verbally given to the patient. Educational information regarding lower intestinal endoscopy was given to the patient. Written instructions for how to complete the bowel prep using Miralax were provided. The importance of drinking ample fluids to avoid dehydration as a result of the prep emphasized.  PCP:  Wende Mott 02/03/2015, 11:51 AM

## 2015-02-04 ENCOUNTER — Ambulatory Visit (INDEPENDENT_AMBULATORY_CARE_PROVIDER_SITE_OTHER): Payer: Medicare Other

## 2015-02-04 DIAGNOSIS — Z23 Encounter for immunization: Secondary | ICD-10-CM

## 2015-02-04 LAB — CYTOLOGY - PAP

## 2015-02-04 NOTE — Progress Notes (Signed)
Patient came in for third HepA/B injection.  Patient received in right deltoid.  Patient tolerated well.

## 2015-02-11 ENCOUNTER — Telehealth: Payer: Self-pay | Admitting: Internal Medicine

## 2015-02-11 DIAGNOSIS — R8789 Other abnormal findings in specimens from female genital organs: Secondary | ICD-10-CM

## 2015-02-11 DIAGNOSIS — R87618 Other abnormal cytological findings on specimens from cervix uteri: Secondary | ICD-10-CM

## 2015-02-11 NOTE — Telephone Encounter (Signed)
-----   Message from Nanci Pina, LPN sent at [phone]  9:09 AM EST ----- Patient stated she would prefer [place] area and a female GYN if possible. Patient voiced understanding as to results.

## 2015-02-11 NOTE — Telephone Encounter (Signed)
Referral is in process as requested to Dr Marcelline Mates at [facility]

## 2015-02-12 NOTE — Telephone Encounter (Signed)
Left message for patient to return call to office. 

## 2015-02-13 ENCOUNTER — Ambulatory Visit: Payer: Self-pay

## 2015-02-14 NOTE — Telephone Encounter (Signed)
Patient notified and voiced understanding.

## 2015-02-18 ENCOUNTER — Ambulatory Visit (INDEPENDENT_AMBULATORY_CARE_PROVIDER_SITE_OTHER): Payer: Medicare Other

## 2015-02-18 VITALS — BP 128/76 | HR 71 | Temp 98.2°F | Resp 12 | Ht 61.5 in | Wt 135.0 lb

## 2015-02-18 DIAGNOSIS — Z0000 Encounter for general adult medical examination without abnormal findings: Secondary | ICD-10-CM

## 2015-02-18 NOTE — Patient Instructions (Addendum)
[name],  Thank you for taking time to come for your Medicare Wellness Visit.  I appreciate your ongoing commitment to your health goals. Please review the following plan we discussed and let me know if I can assist you in the future.  Bone Densitometry Bone densitometry is an imaging test that uses a special X-ray to measure the amount of calcium and other minerals in your bones (bone density). This test is also known as a bone mineral density test or dual-energy X-ray absorptiometry (DXA). The test can measure bone density at your hip and your spine. It is similar to having a regular X-ray. You may have this test to:  Diagnose a condition that causes weak or thin bones (osteoporosis).  Predict your risk of a broken bone (fracture).  Determine how well osteoporosis treatment is working. LET [facility] CARE PROVIDER KNOW ABOUT:  Any allergies you have.  All medicines you are taking, including vitamins, herbs, eye drops, creams, and over-the-counter medicines.  Previous problems you or members of your family have had with the use of anesthetics.  Any blood disorders you have.  Previous surgeries you have had.  Medical conditions you have.  Possibility of pregnancy.  Any other medical test you had within the previous 14 days that used contrast material. RISKS AND COMPLICATIONS Generally, this is a safe procedure. However, problems can occur and may include the following:  This test exposes you to a very small amount of radiation.  The risks of radiation exposure may be greater to unborn children. BEFORE THE PROCEDURE  Do not take any calcium supplements for 24 hours before having the test. You can otherwise eat and drink what you usually do.  Take off all metal jewelry, eyeglasses, dental appliances, and any other metal objects. PROCEDURE  You may lie on an exam table. There will be an X-ray generator below you and an imaging device above you.  Other devices, such as  boxes or braces, may be used to position your body properly for the scan.  You will need to lie still while the machine slowly scans your body.  The images will show up on a computer monitor. AFTER THE PROCEDURE You may need more testing at a later time.   This information is not intended to replace advice given to you by your health care provider. Make sure you discuss any questions you have with your health care provider.   Document Released: 03/23/2004 Document Revised: 03/22/2014 Document Reviewed: 08/09/2013 Elsevier Interactive Patient Education [employer].

## 2015-02-18 NOTE — Progress Notes (Signed)
Subjective:   Sharon Webster is a 69 y.o. female who presents for an Initial Medicare Annual Wellness Visit.  Review of Systems    No ROS.  Medicare Wellness Visit.  Cardiac Risk Factors include: advanced age (>75men, >87 women)     Objective:    Today's Vitals   02/18/15 1001  BP: 128/76  Pulse: 71  Temp: 98.2 F (36.8 C)  TempSrc: Oral  Resp: 12  Height: 5' 1.5" (1.562 m)  Weight: 135 lb (61.236 kg)  SpO2: 99%    Current Medications (verified) Outpatient Encounter Prescriptions as of 02/18/2015  Medication Sig  . aspirin 81 MG tablet Take 81 mg by mouth daily.  Marland Kitchen bismuth subsalicylate (PEPTO BISMOL) 262 MG chewable tablet Chew 524 mg by mouth as needed.  . dicyclomine (BENTYL) 20 MG tablet TAKE 1 TABLET BY MOUTH THREE TIMES DAILY BEFORE MEALS  . omeprazole (PRILOSEC) 20 MG capsule Take 20 mg by mouth 2 (two) times daily before a meal.  . pravastatin (PRAVACHOL) 20 MG tablet Take 1 tablet (20 mg total) by mouth daily.  . Probiotic Product ([place]) CAPS Take by mouth daily.  . Simethicone (GAS-X PO) Take by mouth.  . [DISCONTINUED] polyethylene glycol powder (GLYCOLAX/MIRALAX) powder 255 grams one bottle for colonoscopy prep   No facility-administered encounter medications on file as of 02/18/2015.    Allergies (verified) Review of patient's allergies indicates no known allergies.   History: Past Medical History  Diagnosis Date  . Duodenitis   . COPD (chronic obstructive pulmonary disease) ([place])   . GERD (gastroesophageal reflux disease)   . Diverticulosis   . Glaucoma   . Personal history of tobacco use, presenting hazards to health   . Breast screening, unspecified     atypical ductal hyperplasia, declined excision.  . Lump or mass in breast   . Special screening for malignant neoplasms, colon   . Bowel trouble   . Colon polyp 2011   Past Surgical History  Procedure Laterality Date  . Colonoscopy  08/20/2009    Tubular adenoma x2.  Transverse and descending colon Dr. Dionne Milo  . Breast biopsy  12/14/11    The patient underwent a Finesse biopsy of the right breast December 14, 2011. This was completed as her FNA was inconclusive, with atypical ductal cells identified.   Family History  Problem Relation Age of Onset  . Heart disease Mother   . COPD Mother   . Hypertension Mother   . Cancer Father     metastatic prostate CA  . Stroke Sister     hemorrhagic CVA  . Heart disease Sister     restrictive cardiomyopathy  . Aneurysm Brother    Social History   Occupational History  . Not on file.   Social History Main Topics  . Smoking status: Former Smoker -- 1.00 packs/day for 20 years    Types: Cigarettes    Quit date: 05/15/2001  . Smokeless tobacco: Never Used  . Alcohol Use: 3.6 oz/week    6 Cans of beer per week  . Drug Use: No  . Sexual Activity: Not Currently    Tobacco Counseling Counseling given: Not Answered   Activities of Daily Living In your present state of health, do you have any difficulty performing the following activities: 02/18/2015  Hearing? N  Vision? N  Difficulty concentrating or making decisions? N  Walking or climbing stairs? N  Dressing or bathing? N  Doing errands, shopping? N  Preparing Food and eating ?  N  Using the Toilet? N  In the past six months, have you accidently leaked urine? N  Do you have problems with loss of bowel control? N  Managing your Medications? N  Managing your Finances? N  Housekeeping or managing your Housekeeping? N    Immunizations and Health Maintenance Immunization History  Administered Date(s) Administered  . Hep A / Hep B 07/31/2014, 09/03/2014, 02/04/2015  . Influenza Split 02/04/2014, 01/31/2015  . Influenza,inj,Quad PF,36+ Mos 01/31/2015  . Influenza-Unspecified 02/12/2013  . Pneumococcal Conjugate-13 01/31/2015   There are no preventive care reminders to display for this patient.  Patient Care Team: Crecencio Mc, MD as PCP -  General (Internal Medicine) Robert Bellow, MD (General Surgery) Crecencio Mc, MD (Internal Medicine)  Indicate any recent Medical Services you may have received from other than [facility] providers in the past year (date may be approximate).     Assessment:   This is a routine wellness examination for Sharon Webster. The goal of the wellness visit is to assist the patient how to close the gaps in care and create a preventative care plan for the patient.   Osteoporosis risk reviewed.  DEXA Scan referral postponed, per patient request.    Medications reviewed; taking without issues or barriers.  TDAP and ZOSTAVAX vaccine postponed, per patient request.  Safety issues reviewed; smoke detectors in the home. Firearms locked in a secure area in the home. Wears seatbelts when driving or riding with others. No violence in the home.  No identified risk were noted; The patient was oriented x 3; appropriate in dress and manner and no objective failures at ADL's or IADL's.   Patient Concerns: None at this time. Follow up with PCP as needed.    Hearing/Vision screen Hearing Screening Comments: Passes the whisper test Vision Screening Comments: Followed by [facility] care Glaucoma Wears glasses Annual visits   Dietary issues and exercise activities discussed: Current Exercise Habits:: Home exercise routine, Type of exercise: walking, Time (Minutes): 30, Frequency (Times/Week): 5, Weekly Exercise (Minutes/Week): 150, Intensity: Mild  Goals    . Increase physical activity     Keep walking regiment and start swimming with Silver Sneakers     . Increase water intake     Currently drinks 1 bottle =2 cups of water daily.  Patient centered goal is to drink 2 bottles =4 cups daily      Depression Screen PHQ 2/9 Scores 02/18/2015  PHQ - 2 Score 0    Fall Risk Fall Risk  02/18/2015  Falls in the past year? No    Cognitive Function: MMSE - Mini Mental State Exam 02/18/2015  Orientation to  time 5  Orientation to Place 5  Registration 3  Attention/ Calculation 5  Recall 3  Language- name 2 objects 2  Language- repeat 1  Language- follow 3 step command 3  Language- read & follow direction 1  Write a sentence 1  Copy design 1  Total score 30    Screening Tests Health Maintenance  Topic Date Due  . DEXA SCAN  02/17/2016 (Originally 08/13/2011)  . ZOSTAVAX  02/17/2016 (Originally 08/13/2006)  . TETANUS/TDAP  02/17/2016 (Originally 08/12/1965)  . INFLUENZA VACCINE  10/14/2015  . PNA vac Low Risk Adult (2 of 2 - PPSV23) 01/31/2016  . MAMMOGRAM  01/23/2017  . COLONOSCOPY  08/21/2019  . Hepatitis C Screening  Completed      Plan:    End of life planning; Advance aging; Advanced directives discussed. Living Will complete.  Copy requested.  Follow up as needed with PCP.  During the course of the visit, [place] was educated and counseled about the following appropriate screening and preventive services:   Vaccines to include Pneumoccal, Influenza, Hepatitis B, Td, Zostavax, HCV  Electrocardiogram  Cardiovascular disease screening  Colorectal cancer screening  Bone density screening  Diabetes screening  Glaucoma screening  Mammography/PAP  Nutrition counseling  Smoking cessation counseling  Patient Instructions (the written plan) were given to the patient.    Varney Biles, LPN   [phone]

## 2015-02-19 NOTE — Progress Notes (Signed)
  I have reviewed the above information and agree with above.   Teresa Tullo, MD 

## 2015-03-23 ENCOUNTER — Other Ambulatory Visit: Payer: Self-pay | Admitting: Internal Medicine

## 2015-03-27 ENCOUNTER — Telehealth: Payer: Self-pay | Admitting: *Deleted

## 2015-03-27 NOTE — Telephone Encounter (Signed)
Patient was contacted today and confirms no medication changes since last office visit. She reports that she has picked up Miralax prescription already.   We will proceed with colonoscopy as scheduled for 04-02-15 at [facility].  This patient was instructed to call the office should she have further questions.

## 2015-04-02 ENCOUNTER — Encounter: Payer: Self-pay | Admitting: Anesthesiology

## 2015-04-02 ENCOUNTER — Ambulatory Visit: Payer: Medicare Other | Admitting: Anesthesiology

## 2015-04-02 ENCOUNTER — Ambulatory Visit
Admission: RE | Admit: 2015-04-02 | Discharge: 2015-04-02 | Disposition: A | Discharge status: Home / Self Care | Payer: Medicare Other | Source: Ambulatory Visit | Attending: General Surgery | Admitting: General Surgery

## 2015-04-02 ENCOUNTER — Encounter
Admission: RE | Disposition: A | Discharge status: Home / Self Care | Payer: Self-pay | Source: Ambulatory Visit | Attending: General Surgery

## 2015-04-02 DIAGNOSIS — K219 Gastro-esophageal reflux disease without esophagitis: Secondary | ICD-10-CM | POA: Insufficient documentation

## 2015-04-02 DIAGNOSIS — K5730 Diverticulosis of large intestine without perforation or abscess without bleeding: Secondary | ICD-10-CM | POA: Insufficient documentation

## 2015-04-02 DIAGNOSIS — Z87891 Personal history of nicotine dependence: Secondary | ICD-10-CM | POA: Insufficient documentation

## 2015-04-02 DIAGNOSIS — H409 Unspecified glaucoma: Secondary | ICD-10-CM | POA: Insufficient documentation

## 2015-04-02 DIAGNOSIS — D120 Benign neoplasm of cecum: Secondary | ICD-10-CM | POA: Insufficient documentation

## 2015-04-02 DIAGNOSIS — Z1211 Encounter for screening for malignant neoplasm of colon: Secondary | ICD-10-CM | POA: Insufficient documentation

## 2015-04-02 DIAGNOSIS — J449 Chronic obstructive pulmonary disease, unspecified: Secondary | ICD-10-CM | POA: Insufficient documentation

## 2015-04-02 DIAGNOSIS — Z86010 Personal history of colonic polyps: Secondary | ICD-10-CM

## 2015-04-02 DIAGNOSIS — Z79899 Other long term (current) drug therapy: Secondary | ICD-10-CM | POA: Diagnosis not present

## 2015-04-02 DIAGNOSIS — Z7982 Long term (current) use of aspirin: Secondary | ICD-10-CM | POA: Insufficient documentation

## 2015-04-02 HISTORY — PX: COLONOSCOPY WITH PROPOFOL: SHX5780

## 2015-04-02 SURGERY — COLONOSCOPY WITH PROPOFOL
Anesthesia: General

## 2015-04-02 MED ORDER — FENTANYL CITRATE (PF) 100 MCG/2ML IJ SOLN
INTRAMUSCULAR | Status: DC | PRN
  Administered 2015-04-02: 50 ug via INTRAVENOUS

## 2015-04-02 MED ORDER — SODIUM CHLORIDE 0.9 % IV SOLN
INTRAVENOUS | Status: DC
  Administered 2015-04-02: 10:00:00 via INTRAVENOUS

## 2015-04-02 MED ORDER — MIDAZOLAM HCL 2 MG/2ML IJ SOLN
INTRAMUSCULAR | Status: DC | PRN
Start: 2015-04-02 — End: 2015-04-02
  Administered 2015-04-02: 1 mg via INTRAVENOUS

## 2015-04-02 MED ORDER — PROPOFOL 10 MG/ML IV BOLUS
INTRAVENOUS | Status: DC | PRN
  Administered 2015-04-02 (×2): 40 mg via INTRAVENOUS

## 2015-04-02 MED ORDER — PROPOFOL 500 MG/50ML IV EMUL
INTRAVENOUS | Status: DC | PRN
  Administered 2015-04-02: 125 ug/kg/min via INTRAVENOUS

## 2015-04-02 NOTE — Anesthesia Postprocedure Evaluation (Signed)
Anesthesia Post Note  Patient: Hang Ortega  Procedure(s) Performed: Procedure(s) (LRB): COLONOSCOPY WITH PROPOFOL (N/A)  Patient location during evaluation: Endoscopy Anesthesia Type: General Level of consciousness: awake and alert Pain management: pain level controlled Vital Signs Assessment: post-procedure vital signs reviewed and stable Respiratory status: spontaneous breathing, nonlabored ventilation, respiratory function stable and patient connected to nasal cannula oxygen Cardiovascular status: blood pressure returned to baseline and stable Postop Assessment: no signs of nausea or vomiting Anesthetic complications: no    Last Vitals:  Filed Vitals:   04/02/15 1123 04/02/15 1137  BP: 124/72 138/67  Pulse: 74 87  Temp:    Resp: 13 20    Last Pain: There were no vitals filed for this visit.               Martha Clan

## 2015-04-02 NOTE — Anesthesia Preprocedure Evaluation (Signed)
Anesthesia Evaluation  Patient identified by MRN, date of birth, ID band Patient awake    Reviewed: Allergy & Precautions, H&P , NPO status , Patient's Chart, lab work & pertinent test results, reviewed documented beta blocker date and time   History of Anesthesia Complications Negative for: history of anesthetic complications  Airway Mallampati: III  TM Distance: >3 FB Neck ROM: full    Dental no notable dental hx. (+) Partial Upper, Partial Lower   Pulmonary neg shortness of breath, neg sleep apnea, COPD, neg recent URI, former smoker,    Pulmonary exam normal breath sounds clear to auscultation       Cardiovascular Exercise Tolerance: Good negative cardio ROS Normal cardiovascular exam Rhythm:regular Rate:Normal     Neuro/Psych negative neurological ROS  negative psych ROS   GI/Hepatic Neg liver ROS, GERD  ,  Endo/Other  negative endocrine ROS  Renal/GU negative Renal ROS  negative genitourinary   Musculoskeletal   Abdominal   Peds  Hematology negative hematology ROS (+)   Anesthesia Other Findings Past Medical History:   Duodenitis                                                   COPD (chronic obstructive pulmonary disease) (*              GERD (gastroesophageal reflux disease)                       Diverticulosis                                               Glaucoma                                                     Personal history of tobacco use, presenting ha*              Breast screening, unspecified                                  Comment:atypical ductal hyperplasia, declined excision.   Lump or mass in breast                                       Special screening for malignant neoplasms, col*              Bowel trouble                                                Colon polyp                                     2011  Reproductive/Obstetrics negative OB ROS                              Anesthesia Physical Anesthesia Plan  ASA: II  Anesthesia Plan: General   Post-op Pain Management:    Induction:   Airway Management Planned:   Additional Equipment:   Intra-op Plan:   Post-operative Plan:   Informed Consent: I have reviewed the patients History and Physical, chart, labs and discussed the procedure including the risks, benefits and alternatives for the proposed anesthesia with the patient or authorized representative who has indicated his/her understanding and acceptance.   Dental Advisory Given  Plan Discussed with: Anesthesiologist, CRNA and Surgeon  Anesthesia Plan Comments:         Anesthesia Quick Evaluation

## 2015-04-02 NOTE — H&P (Signed)
Alice Roop [id number] Sep 07, 1945     HPI: Past history of tubular adenoma of the transverse and descending colon in 2011. For follow up exam. Asymptomatic.  Prescriptions prior to admission  Medication Sig Dispense Refill Last Dose  . aspirin 81 MG tablet Take 81 mg by mouth daily.   Taking  . bismuth subsalicylate (PEPTO BISMOL) 262 MG chewable tablet Chew 524 mg by mouth as needed.   Taking  . dicyclomine (BENTYL) 20 MG tablet TAKE 1 TABLET BY MOUTH THREE TIMES DAILY BEFORE MEALS 90 tablet 0   . omeprazole (PRILOSEC) 20 MG capsule Take 20 mg by mouth 2 (two) times daily before a meal.   Taking  . pravastatin (PRAVACHOL) 20 MG tablet Take 1 tablet (20 mg total) by mouth daily. 90 tablet 3 Taking  . Probiotic Product ([place]) CAPS Take by mouth daily.   Taking  . Simethicone (GAS-X PO) Take by mouth.   Taking   No Known Allergies Past Medical History  Diagnosis Date  . Duodenitis   . COPD (chronic obstructive pulmonary disease) ([place])   . GERD (gastroesophageal reflux disease)   . Diverticulosis   . Glaucoma   . Personal history of tobacco use, presenting hazards to health   . Breast screening, unspecified     atypical ductal hyperplasia, declined excision.  . Lump or mass in breast   . Special screening for malignant neoplasms, colon   . Bowel trouble   . Colon polyp 2011   Past Surgical History  Procedure Laterality Date  . Colonoscopy  08/20/2009    Tubular adenoma x2. Transverse and descending colon Dr. Dionne Milo  . Breast biopsy  12/14/11    The patient underwent a Finesse biopsy of the right breast December 14, 2011. This was completed as her FNA was inconclusive, with atypical ductal cells identified.   Social History   Social History  . Marital Status: Widowed    Spouse Name: N/A  . Number of Children: N/A  . Years of Education: N/A   Occupational History  . Not on file.   Social History Main Topics  . Smoking status: Former Smoker -- 1.00  packs/day for 20 years    Types: Cigarettes    Quit date: 05/15/2001  . Smokeless tobacco: Never Used  . Alcohol Use: 3.6 oz/week    6 Cans of beer per week  . Drug Use: No  . Sexual Activity: Not Currently   Other Topics Concern  . Not on file   Social History Narrative   Social History   Social History Narrative     ROS: Negative.      PE: HEENT: Negative. Lungs: Clear. Cardio: RR. Robert Bellow 04/02/2015   Assessment/Plan:  Proceed with planned endoscopy.

## 2015-04-02 NOTE — Transfer of Care (Signed)
Immediate Anesthesia Transfer of Care Note  Patient: Sharon Webster  Procedure(s) Performed: Procedure(s): COLONOSCOPY WITH PROPOFOL (N/A)  Patient Location: PACU  Anesthesia Type:General  Level of Consciousness: awake, alert , oriented and sedated  Airway & Oxygen Therapy: Patient Spontanous Breathing and Patient connected to nasal cannula oxygen  Post-op Assessment: Report given to RN and Post -op Vital signs reviewed and stable  Post vital signs: Reviewed and stable  Last Vitals:  Filed Vitals:   04/02/15 0921 04/02/15 1053  BP: 165/77 106/54  Pulse: 99 84  Temp: 36.4 C 35.7 C  Resp: 19 16    Complications: No apparent anesthesia complications

## 2015-04-02 NOTE — Op Note (Signed)
[facility] Gastroenterology Patient Name: Sharon Webster Procedure Date: 04/02/2015 10:21 AM MRN: [id number] Account #: [id number] Date of Birth: 09-20-1945 Admit Type: Outpatient Age: 69 Room: [facility] ENDO ROOM 1 Gender: Female Note Status: Finalized Procedure:         Colonoscopy Indications:       High risk colon cancer surveillance: Personal history of                     colonic polyps Providers:         Robert Bellow, MD Referring MD:      Deborra Medina, MD (Referring MD) Medicines:         Monitored Anesthesia Care Complications:     No immediate complications. Procedure:         Pre-Anesthesia Assessment:                    - Prior to the procedure, a History and Physical was                     performed, and patient medications, allergies and                     sensitivities were reviewed. The patient's tolerance of                     previous anesthesia was reviewed.                    - The risks and benefits of the procedure and the sedation                     options and risks were discussed with the patient. All                     questions were answered and informed consent was obtained.                    After obtaining informed consent, the colonoscope was                     passed under direct vision. Throughout the procedure, the                     patient's blood pressure, pulse, and oxygen saturations                     were monitored continuously. The Colonoscope was                     introduced through the anus and advanced to the the cecum,                     identified by appendiceal orifice and ileocecal valve. The                     colonoscopy was performed without difficulty. The patient                     tolerated the procedure well. The quality of the bowel                     preparation was excellent. Findings:      A 5 mm polyp was found in the cecum. The polyp was sessile. Biopsies  were taken with a  cold forceps for histology. Biopsies were taken with a       cold forceps for histology.      The retroflexed view of the distal rectum and anal verge was normal and       showed no anal or rectal abnormalities.      A few large-mouthed diverticula were found in the sigmoid colon. Impression:        - One 5 mm polyp in the cecum. Biopsied.                    - The distal rectum and anal verge are normal on                     retroflexion view. Recommendation:    - Telephone endoscopist for pathology results in 1 week. Procedure Code(s): --- Professional ---                    [phone], Colonoscopy, flexible; with biopsy, single or                     multiple Diagnosis Code(s): --- Professional ---                    Z86.010, Personal history of colonic polyps CPT copyright 2014 American Medical Association. All rights reserved. The codes documented in this report are preliminary and upon coder review may  be revised to meet current compliance requirements. Robert Bellow, MD 04/02/2015 10:46:37 AM This report has been signed electronically. Number of Addenda: 0 Note Initiated On: 04/02/2015 10:21 AM Scope Withdrawal Time: 0 hours 5 minutes 12 seconds  Total Procedure Duration: 0 hours 16 minutes 34 seconds       [facility]

## 2015-04-02 NOTE — Anesthesia Procedure Notes (Signed)
Date/Time: 04/02/2015 11:02 AM Performed by: Nelda Marseille Pre-anesthesia Checklist: Patient identified, Emergency Drugs available, Suction available, Patient being monitored and Timeout performed Oxygen Delivery Method: Nasal cannula

## 2015-04-03 ENCOUNTER — Encounter: Payer: Self-pay | Admitting: Obstetrics and Gynecology

## 2015-04-03 LAB — SURGICAL PATHOLOGY

## 2015-04-08 ENCOUNTER — Telehealth: Payer: Self-pay

## 2015-04-08 NOTE — Telephone Encounter (Signed)
-----   Message from Robert Bellow, MD sent at 04/08/2015  9:56 AM EST ----- Please notify the patient that the polyp removed at the time of her exam was benign but she does need a follow-up exam in 5 years. Please put her in recalls.   Thank yo ----- Message -----    From: [facility] Interface    Sent: 04/03/2015   7:14 PM      To: Robert Bellow, MD

## 2015-04-08 NOTE — Telephone Encounter (Signed)
Notified patient as instructed, patient pleased. Discussed follow-up in 5 years, patient agrees.  

## 2015-04-10 ENCOUNTER — Encounter: Payer: Self-pay | Admitting: Obstetrics and Gynecology

## 2015-04-29 ENCOUNTER — Encounter: Payer: Self-pay | Admitting: Obstetrics and Gynecology

## 2015-04-29 ENCOUNTER — Ambulatory Visit (INDEPENDENT_AMBULATORY_CARE_PROVIDER_SITE_OTHER): Payer: Medicare Other | Admitting: Obstetrics and Gynecology

## 2015-04-29 VITALS — BP 169/90 | HR 93 | Ht 60.0 in | Wt 134.7 lb

## 2015-04-29 DIAGNOSIS — R888 Abnormal findings in other body fluids and substances: Secondary | ICD-10-CM

## 2015-04-29 DIAGNOSIS — IMO0002 Reserved for concepts with insufficient information to code with codable children: Secondary | ICD-10-CM

## 2015-04-29 DIAGNOSIS — N952 Postmenopausal atrophic vaginitis: Secondary | ICD-10-CM

## 2015-04-29 DIAGNOSIS — B977 Papillomavirus as the cause of diseases classified elsewhere: Secondary | ICD-10-CM

## 2015-04-29 NOTE — Progress Notes (Signed)
Subjective:     Sharon Webster is a 70 y.o. P0 postmenopausal woman who comes in today for evaluation of abnormal pap smear.  Patient with h/o NILM but HPV HR + pap smears over the past 2 years. Her most recent annual exam was on 01/31/15. Her most recent Pap smear was on 01/31/15 and showed NILM but HR HPV + and vaginal atrophy.    Past Medical History  Diagnosis Date  . Duodenitis   . COPD (chronic obstructive pulmonary disease) ([place])   . GERD (gastroesophageal reflux disease)   . Diverticulosis   . Glaucoma   . Personal history of tobacco use, presenting hazards to health   . Breast screening, unspecified     atypical ductal hyperplasia, declined excision.  . Lump or mass in breast   . Special screening for malignant neoplasms, colon   . Bowel trouble   . Colon polyp 2011    Family History  Problem Relation Age of Onset  . Heart disease Mother   . COPD Mother   . Hypertension Mother   . Cancer Father     metastatic prostate CA  . Stroke Sister     hemorrhagic CVA  . Heart disease Sister     restrictive cardiomyopathy  . Aneurysm Brother     Past Surgical History  Procedure Laterality Date  . Colonoscopy  08/20/2009    Tubular adenoma x2. Transverse and descending colon Dr. Dionne Milo  . Breast biopsy  12/14/11    The patient underwent a Finesse biopsy of the right breast December 14, 2011. This was completed as her FNA was inconclusive, with atypical ductal cells identified.  . Colonoscopy with propofol N/A 04/02/2015    Procedure: COLONOSCOPY WITH PROPOFOL;  Surgeon: Robert Bellow, MD;  Location: [facility] ENDOSCOPY;  Service: Endoscopy;  Laterality: N/A;    Social History   Social History  . Marital Status: Widowed    Spouse Name: N/A  . Number of Children: N/A  . Years of Education: N/A   Occupational History  . Not on file.   Social History Main Topics  . Smoking status: Former Smoker -- 1.00 packs/day for 20 years    Types: Cigarettes    Quit  date: 05/15/2001  . Smokeless tobacco: Never Used  . Alcohol Use: 3.6 oz/week    6 Cans of beer per week  . Drug Use: No  . Sexual Activity: Not Currently   Other Topics Concern  . Not on file   Social History Narrative    Current Outpatient Prescriptions on File Prior to Visit  Medication Sig Dispense Refill  . aspirin 81 MG tablet Take 81 mg by mouth daily.    Marland Kitchen bismuth subsalicylate (PEPTO BISMOL) 262 MG chewable tablet Chew 524 mg by mouth as needed.    . dicyclomine (BENTYL) 20 MG tablet TAKE 1 TABLET BY MOUTH THREE TIMES DAILY BEFORE MEALS 90 tablet 0  . omeprazole (PRILOSEC) 20 MG capsule Take 20 mg by mouth 2 (two) times daily before a meal.    . pravastatin (PRAVACHOL) 20 MG tablet Take 1 tablet (20 mg total) by mouth daily. 90 tablet 3  . Probiotic Product ([place]) CAPS Take by mouth daily.    . Simethicone (GAS-X PO) Take by mouth.     No current facility-administered medications on file prior to visit.    No Known Allergies    Review of Systems A comprehensive review of systems was negative.   Objective:  BP 169/90 mmHg  Pulse 93  Ht 5' (1.524 m)  Wt 134 lb 11.2 oz (61.1 kg)  BMI 26.31 kg/m2 Pelvic Exam: cervix normal in appearance, external genitalia normal, vagina normal without discharge and vaginal atrophy (moderate) present. Pap smear obtained.   Assessment:     Pap smear for HPV genotyping obtained.   Plan:    Follow up in 1 week by phone for results. If positive for genotypes 16 or 18, will need colposcopy.  Discussed HPV and cervical cancer screening, as well as colposcopy procedure if needed. All questions answered.    A total of 20 minutes were spent face-to-face with the patient during this encounter and over half of that time dealt with counseling.   Rubie Maid, MD [facility]

## 2015-04-29 NOTE — Patient Instructions (Signed)
HPV Test The human papillomavirus (HPV) test is used to look for high-risk types of HPV infection. HPV is a group of about 100 viruses. Many of these viruses cause growths on, in, or around the genitals. Most HPV viruses cause infections that usually go away without treatment. However, HPV types 6, 11, 16, and 18 are considered high-risk types of HPV that can increase your risk of cancer of the cervix or anus if the infection is left untreated. An HPV test identifies the DNA (genetic) strands of the HPV infection, so it is also referred to as the HPV DNA test. Although HPV is found in both males and females, the HPV test is only used to screen for increased cancer risk in females:  With an abnormal Pap test.  After treatment of an abnormal Pap test.  Between the ages of 82 and 56.  After treatment of a high-risk HPV infection. The HPV test may be done at the same time as a pelvic exam and Pap test in females over the age of 51. Both the HPV test and Pap test require a sample of cells from the cervix. PREPARATION FOR TEST  Do not douche or take a bath for 24-48 hours before the test or as directed by your health care provider.  Do not have sex for 24-48 hours before the test or as directed by your health care provider.  You may be asked to reschedule the test if you are menstruating.  You will be asked to urinate before the test. RESULTS It is your responsibility to obtain your test results. Ask the lab or department performing the test when and how you will get your results. Talk with your health care provider if you have any questions about your results. Your result will be negative or positive. Meaning of Negative Test Results A negative HPV test result means that no HPV was found, and it is very likely that you do not have HPV. Meaning of Positive Test Results A positive HPV test result indicates that you have HPV.  If your test result shows the presence of any high-risk HPV strains,  you may have an increased risk of developing cancer of the cervix or anus if the infection is left untreated.  If any low-risk HPV strains are found, you are not likely to have an increased risk of cancer. Discuss your test results with your health care provider. He or she will use the results to make a diagnosis and determine a treatment plan that is right for you.   This information is not intended to replace advice given to you by your health care provider. Make sure you discuss any questions you have with your health care provider.   Document Released: 03/26/2004 Document Revised: 03/22/2014 Document Reviewed: 07/17/2013 Elsevier Interactive Patient Education 2016 [place].      Colposcopy Colposcopy is a procedure to examine your cervix and vagina, or the area around the outside of your vagina, for abnormalities or signs of disease. The procedure is done using a lighted microscope called a colposcope. Tissue samples may be collected during the colposcopy if your health care provider finds any unusual cells. A colposcopy may be done if a woman has:  An abnormal Pap test. A Pap test is a medical test done to evaluate cells that are on the surface of the cervix.  A Pap test result that is suggestive of human papillomavirus (HPV). This virus can cause genital warts and is linked to the development of  cervical cancer.  A sore on her cervix and the results of a Pap test were normal.  Genital warts on the cervix or in or around the outside of the vagina.  A mother who took the drug diethylstilbestrol (DES) while pregnant.  Painful intercourse.  Vaginal bleeding, especially after sexual intercourse. LET [facility] CARE PROVIDER KNOW ABOUT:  Any allergies you have.  All medicines you are taking, including vitamins, herbs, eye drops, creams, and over-the-counter medicines.  Previous problems you or members of your family have had with the use of anesthetics.  Any blood  disorders you have.  Previous surgeries you have had.  Medical conditions you have. RISKS AND COMPLICATIONS Generally, a colposcopy is a safe procedure. However, as with any procedure, complications can occur. Possible complications include:  Bleeding.  Infection.  Missed lesions. BEFORE THE PROCEDURE   Tell your health care provider if you have your menstrual period. A colposcopy typically is not done during menstruation.  For 24 hours before the colposcopy, do not:  D[place]che.  Use tampons.  Use medicines, creams, or suppositories in the vagina.  Have sexual intercourse. PROCEDURE  During the procedure, you will be lying on your back with your feet in foot rests (stirrups). A warm metal or plastic instrument (speculum) will be placed in your vagina to keep it open and to allow the health care provider to see the cervix. The colposcope will be placed outside the vagina. It will be used to magnify and examine the cervix, vagina, and the area around the outside of the vagina. A small amount of liquid solution will be placed on the area that is to be viewed. This solution will make it easier to see the abnormal cells. Your health care provider will use tools to suck out mucus and cells from the canal of the cervix. Then he or she will record the location of the abnormal areas. If a biopsy is done during the procedure, a medicine will usually be given to numb the area (local anesthetic). You may feel mild pain or cramping while the biopsy is done. After the procedure, tissue samples collected during the biopsy will be sent to a lab for analysis. AFTER THE PROCEDURE  You will be given instructions on when to follow up with your health care provider for your test results. It is important to keep your appointment.   This information is not intended to replace advice given to you by your health care provider. Make sure you discuss any questions you have with your health care provider.     Document Released: 05/22/2002 Document Revised: 11/01/2012 Document Reviewed: 09/28/2012 Elsevier Interactive Patient Education [employer].

## 2015-05-01 LAB — HPV GENOTYPES 16/18,45
HPV Genotype 16: NEGATIVE
HPV Genotype 18,45: NEGATIVE

## 2015-05-02 ENCOUNTER — Telehealth: Payer: Self-pay | Admitting: Obstetrics and Gynecology

## 2015-05-02 NOTE — Telephone Encounter (Signed)
Called pt informed her of negative HPV results, per result note.

## 2015-05-02 NOTE — Telephone Encounter (Signed)
Pt calling for results. She gave me a new phone number to call.

## 2015-05-02 NOTE — Progress Notes (Signed)
Pt informed. See telephone encounter.

## 2015-05-27 ENCOUNTER — Other Ambulatory Visit: Payer: Self-pay | Admitting: Internal Medicine

## 2015-06-23 ENCOUNTER — Other Ambulatory Visit: Payer: Self-pay | Admitting: Internal Medicine

## 2015-07-21 ENCOUNTER — Other Ambulatory Visit: Payer: Self-pay | Admitting: Internal Medicine

## 2015-10-15 ENCOUNTER — Other Ambulatory Visit: Payer: Self-pay | Admitting: Internal Medicine

## 2015-11-26 ENCOUNTER — Encounter: Payer: Self-pay | Admitting: *Deleted

## 2015-12-02 ENCOUNTER — Ambulatory Visit (INDEPENDENT_AMBULATORY_CARE_PROVIDER_SITE_OTHER): Payer: Medicare Other | Admitting: Family Medicine

## 2015-12-02 ENCOUNTER — Encounter: Payer: Self-pay | Admitting: Family Medicine

## 2015-12-02 VITALS — BP 140/102 | HR 90 | Temp 98.1°F | Wt 132.4 lb

## 2015-12-02 DIAGNOSIS — G8929 Other chronic pain: Secondary | ICD-10-CM

## 2015-12-02 DIAGNOSIS — R109 Unspecified abdominal pain: Secondary | ICD-10-CM | POA: Diagnosis not present

## 2015-12-02 LAB — COMPREHENSIVE METABOLIC PANEL
ALBUMIN: 4.7 g/dL (ref 3.5–5.2)
ALT: 17 U/L (ref 0–35)
AST: 18 U/L (ref 0–37)
Alkaline Phosphatase: 98 U/L (ref 39–117)
BILIRUBIN TOTAL: 0.5 mg/dL (ref 0.2–1.2)
BUN: 10 mg/dL (ref 6–23)
CO2: 31 mEq/L (ref 19–32)
CREATININE: 0.83 mg/dL (ref 0.40–1.20)
Calcium: 9.6 mg/dL (ref 8.4–10.5)
Chloride: 103 mEq/L (ref 96–112)
GFR: 72.38 mL/min (ref >60.00)
GLUCOSE: 102 mg/dL — AB (ref 70–99)
POTASSIUM: 4 meq/L (ref 3.5–5.1)
SODIUM: 140 meq/L (ref 135–145)
TOTAL PROTEIN: 7.8 g/dL (ref 6.0–8.3)

## 2015-12-02 LAB — CBC
HEMATOCRIT: 37.7 % (ref 36.0–46.0)
HEMOGLOBIN: 12.9 g/dL (ref 12.0–15.0)
MCHC: 34.2 g/dL (ref 30.0–36.0)
MCV: 85.5 fl (ref 78.0–100.0)
Platelets: 256 10*3/uL (ref 150.0–400.0)
RBC: 4.41 Mil/uL (ref 3.87–5.11)
RDW: 13.1 % (ref 11.5–15.5)
WBC: 5.9 10*3/uL (ref 4.0–10.5)

## 2015-12-02 LAB — C-REACTIVE PROTEIN: CRP: 0.2 mg/dL — ABNORMAL LOW (ref 0.5–20.0)

## 2015-12-02 LAB — SEDIMENTATION RATE: SED RATE: 2 mm/h (ref 0–30)

## 2015-12-02 MED ORDER — PANTOPRAZOLE SODIUM 40 MG PO TBEC: 40.0000 mg | DELAYED_RELEASE_TABLET | Freq: Every day | ORAL | 3 refills | Status: DC

## 2015-12-02 MED ORDER — LINACLOTIDE 290 MCG PO CAPS: 290.0000 ug | ORAL_CAPSULE | Freq: Every day | ORAL | 0 refills | Status: DC

## 2015-12-02 MED ORDER — DICYCLOMINE HCL 20 MG PO TABS: ORAL_TABLET | ORAL | 1 refills | Status: DC

## 2015-12-02 NOTE — Progress Notes (Signed)
Pre visit review using our clinic review tool, if applicable. No additional management support is needed unless otherwise documented below in the visit note. 

## 2015-12-02 NOTE — Progress Notes (Signed)
Subjective:  Patient ID: Sharon Webster, female    DOB: 1945/08/04  Age: 70 y.o. MRN: [id number]  CC: Abdominal pain  HPI:  70 year old female with chronic abdominal pain presents with complaints of worsening abdominal pain.  Per the EMR, patient has chronic right lower quadrant pain.  Patient states that over the past 4 days she's had significant lower abdominal pain. Pain is located on the left and right sides of the lower abdomen. Patient states that the pain is severe. Described as cramping/burning. Patient states that she has intermittent constipation and diarrhea. No associated fevers or chills. No known exacerbating or relieving factors. No other associated symptoms. She has been taking her Bentyl without significant improvement. She is also taking a PPI without improvement. No other complaints at this time.  Social Hx   Social History   Social History  . Marital status: Widowed    Spouse name: N/A  . Number of children: N/A  . Years of education: N/A   Social History Main Topics  . Smoking status: Former Smoker    Packs/day: 1.00    Years: 20.00    Types: Cigarettes    Quit date: 05/15/2001  . Smokeless tobacco: Never Used  . Alcohol use 3.6 oz/week    6 Cans of beer per week  . Drug use: No  . Sexual activity: Not Currently   Other Topics Concern  . None   Social History Narrative  . None   Review of Systems  Constitutional: Negative.   Gastrointestinal: Positive for abdominal pain, constipation and diarrhea.   Objective:  BP (!) 140/102 (BP Location: Right Arm, Patient Position: Sitting, Cuff Size: Normal)   Pulse 90   Temp 98.1 F (36.7 C) (Oral)   Wt 132 lb 6 oz (60 kg)   SpO2 98%   BMI 25.85 kg/m   BP/Weight 12/02/2015 04/29/2015 [id number]  Systolic BP [id number]  Diastolic BP A999333 90 67  Wt. (Lbs) 132.38 134.7 -  BMI 25.85 26.31 -   Physical Exam  Constitutional: She is oriented to person, place, and time. She appears well-developed. No  distress.  Cardiovascular: Normal rate and regular rhythm.   Pulmonary/Chest: Effort normal and breath sounds normal.  Abdominal: Soft. She exhibits no distension.  Patient with tenderness to palpation diffusely.  Neurological: She is alert and oriented to person, place, and time.  Psychiatric: She has a normal mood and affect.  Vitals reviewed.   Lab Results  Component Value Date   WBC 5.7 07/31/2014   HGB 12.6 07/31/2014   HCT 36.6 07/31/2014   PLT 233.0 07/31/2014   GLUCOSE 104 (H) 01/31/2015   CHOL 193 01/31/2015   TRIG 158.0 (H) 01/31/2015   HDL 52.40 01/31/2015   LDLDIRECT 162.8 10/15/2011   LDLCALC 109 (H) 01/31/2015   ALT 16 01/31/2015   AST 17 01/31/2015   NA 140 01/31/2015   K 3.7 01/31/2015   CL 103 01/31/2015   CREATININE 0.83 01/31/2015   BUN 14 01/31/2015   CO2 30 01/31/2015   TSH 1.27 07/31/2014    Assessment & Plan:   Problem List Items Addressed This Visit    Chronic abdominal pain - Primary    Acute on chronic. Patient well-appearing on exam. No focality to abdominal exam. Does not appear to have any acute intra-abdominal pathology. Obtaining labs today for further evaluation to ensure no leukocytosis or evidence of increase in inflammatory markers suggestive of an underlying acute process. I suspect the patient has IBS given  her history of chronic abdominal pain as well as intermittent constipation and diarrhea. She states that her constipation is the predominant symptom in addition to pain. Treating with Linzess.      Relevant Orders   CBC   Comprehensive metabolic panel   Sedimentation rate   C-reactive protein    Other Visit Diagnoses   None.     Meds ordered this encounter  Medications  . pantoprazole (PROTONIX) 40 MG tablet    Sig: Take 1 tablet (40 mg total) by mouth daily.    Dispense:  90 tablet    Refill:  3  . linaclotide (LINZESS) 290 MCG CAPS capsule    Sig: Take 1 capsule (290 mcg total) by mouth daily before breakfast.     Dispense:  30 capsule    Refill:  0  . dicyclomine (BENTYL) 20 MG tablet    Sig: TAKE 1 TABLET BY MOUTH THREE TIMES DAILY BEFORE MEALS    Dispense:  90 tablet    Refill:  1    Follow-up: PRN  [place]

## 2015-12-02 NOTE — Patient Instructions (Signed)
Take the protonix instead of the omeprazole.  Try the Linzess for your belly.  Follow up closely with Dr. Derrel Nip  Take care  Dr. Lacinda Axon

## 2015-12-02 NOTE — Assessment & Plan Note (Signed)
Acute on chronic. Patient well-appearing on exam. No focality to abdominal exam. Does not appear to have any acute intra-abdominal pathology. Obtaining labs today for further evaluation to ensure no leukocytosis or evidence of increase in inflammatory markers suggestive of an underlying acute process. I suspect the patient has IBS given her history of chronic abdominal pain as well as intermittent constipation and diarrhea. She states that her constipation is the predominant symptom in addition to pain. Treating with Linzess.

## 2015-12-03 ENCOUNTER — Telehealth: Payer: Self-pay | Admitting: *Deleted

## 2015-12-03 NOTE — Telephone Encounter (Signed)
Patient was informed that results have not been interpreted by Dr. Derrel Nip yet.

## 2015-12-03 NOTE — Telephone Encounter (Signed)
Pt requested a lab results  Pt contact [phone]

## 2015-12-13 ENCOUNTER — Other Ambulatory Visit: Payer: Self-pay | Admitting: Internal Medicine

## 2015-12-26 ENCOUNTER — Other Ambulatory Visit: Payer: Self-pay | Admitting: Family Medicine

## 2016-01-07 ENCOUNTER — Ambulatory Visit (INDEPENDENT_AMBULATORY_CARE_PROVIDER_SITE_OTHER): Payer: Medicare Other | Admitting: Internal Medicine

## 2016-01-07 ENCOUNTER — Encounter: Payer: Self-pay | Admitting: Internal Medicine

## 2016-01-07 VITALS — BP 128/70 | HR 83 | Temp 98.1°F | Resp 10 | Ht 60.0 in | Wt 133.5 lb

## 2016-01-07 DIAGNOSIS — Z23 Encounter for immunization: Secondary | ICD-10-CM | POA: Diagnosis not present

## 2016-01-07 DIAGNOSIS — E785 Hyperlipidemia, unspecified: Secondary | ICD-10-CM | POA: Diagnosis not present

## 2016-01-07 DIAGNOSIS — R109 Unspecified abdominal pain: Secondary | ICD-10-CM

## 2016-01-07 DIAGNOSIS — E7849 Other hyperlipidemia: Secondary | ICD-10-CM

## 2016-01-07 DIAGNOSIS — R194 Change in bowel habit: Secondary | ICD-10-CM | POA: Diagnosis not present

## 2016-01-07 DIAGNOSIS — R7303 Prediabetes: Secondary | ICD-10-CM | POA: Diagnosis not present

## 2016-01-07 DIAGNOSIS — R8789 Other abnormal findings in specimens from female genital organs: Secondary | ICD-10-CM

## 2016-01-07 DIAGNOSIS — G8929 Other chronic pain: Secondary | ICD-10-CM

## 2016-01-07 DIAGNOSIS — R198 Other specified symptoms and signs involving the digestive system and abdomen: Secondary | ICD-10-CM

## 2016-01-07 DIAGNOSIS — E784 Other hyperlipidemia: Secondary | ICD-10-CM

## 2016-01-07 DIAGNOSIS — K760 Fatty (change of) liver, not elsewhere classified: Secondary | ICD-10-CM

## 2016-01-07 DIAGNOSIS — R87618 Other abnormal cytological findings on specimens from cervix uteri: Secondary | ICD-10-CM

## 2016-01-07 LAB — HEMOGLOBIN A1C: Hgb A1c MFr Bld: 5.8 % (ref 4.6–6.5)

## 2016-01-07 LAB — LDL CHOLESTEROL, DIRECT: Direct LDL: 122 mg/dL

## 2016-01-07 LAB — MAGNESIUM: Magnesium: 2.1 mg/dL (ref 1.5–2.5)

## 2016-01-07 LAB — TSH: TSH: 1.52 u[IU]/mL (ref 0.35–4.50)

## 2016-01-07 NOTE — Progress Notes (Signed)
Pre-visit discussion using our clinic review tool. No additional management support is needed unless otherwise documented below in the visit note.  

## 2016-01-07 NOTE — Patient Instructions (Addendum)
Your repeat PAP smear by Dr Marcelline Mates was normal so we will repeat in Feb 2020.  For your bowel problems   WE could try using Amitiza at the lowest dose,  Once daily to see if it helsps your gas/constipation   Also try one of the following buld forming laxatives at bedtime:  Metamucil Citrucel, benefiber Miralax Fibercon   You can try the diet for IBS.    Diet for Irritable Bowel Syndrome When you have irritable bowel syndrome (IBS), the foods you eat and your eating habits are very important. IBS may cause various symptoms, such as abdominal pain, constipation, or diarrhea. Choosing the right foods can help ease discomfort caused by these symptoms. Work with your health care provider and dietitian to find the best eating plan to help control your symptoms. WHAT GENERAL GUIDELINES DO I NEED TO FOLLOW?  Keep a food diary. This will help you identify foods that cause symptoms. Write down:  What you eat and when.  What symptoms you have.  When symptoms occur in relation to your meals.  Avoid foods that cause symptoms. Talk with your dietitian about other ways to get the same nutrients that are in these foods.  Eat more foods that contain fiber. Take a fiber supplement if directed by your dietitian.  Eat your meals slowly, in a relaxed setting.  Aim to eat 5-6 small meals per day. Do not skip meals.  Drink enough fluids to keep your urine clear or pale yellow.  Ask your health care provider if you should take an over-the-counter probiotic during flare-ups to help restore healthy gut bacteria.  If you have cramping or diarrhea, try making your meals low in fat and high in carbohydrates. Examples of carbohydrates are pasta, rice, whole grain breads and cereals, fruits, and vegetables.  If dairy products cause your symptoms to flare up, try eating less of them. You might be able to handle yogurt better than other dairy products because it contains bacteria that help with  digestion. WHAT FOODS ARE NOT RECOMMENDED? The following are some foods and drinks that may worsen your symptoms:  Fatty foods, such as [place] fries.  Milk products, such as cheese or ice cream.  Chocolate.  Alcohol.  Products with caffeine, such as coffee.  Carbonated drinks, such as soda. The items listed above may not be a complete list of foods and beverages to avoid. Contact your dietitian for more information. WHAT FOODS ARE GOOD SOURCES OF FIBER? Your health care provider or dietitian may recommend that you eat more foods that contain fiber. Fiber can help reduce constipation and other IBS symptoms. Add foods with fiber to your diet a little at a time so that your body can get used to them. Too much fiber at once might cause gas and swelling of your abdomen. The following are some foods that are good sources of fiber:  Apples.  Peaches.  Pears.  Berries.  Figs.  Broccoli (raw).  Cabbage.  Carrots.  Raw peas.  Kidney beans.  Lima beans.  Whole grain bread.  Whole grain cereal. FOR MORE INFORMATION  International Foundation for Functional Gastrointestinal Disorders: www.iffgd.[employer] and [employer]estive [employer]: NetworkAffair.co.za.aspx   This information is not intended to replace advice given to you by your health care provider. Make sure you discuss any questions you have with your health care provider.   Document Released: 05/22/2003 Document Revised: 03/22/2014 Document Reviewed: 06/01/2013 Elsevier Interactive Patient Education [employer].

## 2016-01-07 NOTE — Progress Notes (Signed)
Subjective:  Patient ID: Sharon Webster, female    DOB: 02-Feb-1946  Age: 70 y.o. MRN: [id number]  CC: The primary encounter diagnosis was Hyperlipidemia LDL goal <100. Diagnoses of Prediabetes, Altered bowel function, Encounter for immunization, Chronic abdominal pain, Pap smear abnormality of cervix/human papillomavirus (HPV) positive, Hepatic steatosis, and Familial hyperlipidemia, high LDL were also pertinent to this visit.  HPI Rukaya Booz presents for follow up on chronic lower abdominal pain, with altered stool habits. Last seen Nov 2016  In September, was given Linzess 290 mcg dose by Lacinda Axon in September for management of presumed IBS  as source of symptoms.  Screening labs negative for infection/inflammation,  The medication was taken for two days  But caused severe diarrhea so she stopped it .  Symptoms improved but still has burning that she attributes to excessive gas.  Moves bowels every morning after coffee.  Stools are solid,  Thin in caliber and long . Colonoscopy normal January 2017.  Tried taking Colon Health for a month, no difference.  Has not tried BFLs. ' avoids salad and dairy , but tolerates yogurt and cheese.   Has mild diverticulosis by prior CT in 2013 as well as ovarian cysts which were not concerning for malignancy as they were not seen on  follow up transvaginal ultrasound.   Mammogram :  Has an appt tomorrow with Dr Bary Castilla  For mammogram and  follow up planned for  Nov 9th   GI: Had colonoscopy jan  Tubular adenoma cecum  ([place]) 5 yrs follow up.  GYN: Had a repeat PAP smear in Feb 2017 after PAP smear in Nov 2016 qA  HPV positive again . Pap SMEAR By Dr Marcelline Mates.WAS NEGATIVE l.     COPD. Asymptomatic, Has not smoked in 15 years since she had some type of bariatric procedure at [facility] 15 years ago ("they cleaned out my lungs")  Had pancakes and syrup this morning.  nonfasting labs today    Outpatient Medications Prior to Visit  Medication Sig Dispense  Refill  . bismuth subsalicylate (PEPTO BISMOL) 262 MG chewable tablet Chew 524 mg by mouth as needed.    . dicyclomine (BENTYL) 20 MG tablet TAKE 1 TABLET BY MOUTH THREE TIMES DAILY BEFORE MEALS 90 tablet 1  . pantoprazole (PROTONIX) 40 MG tablet Take 1 tablet (40 mg total) by mouth daily. 90 tablet 3  . pravastatin (PRAVACHOL) 20 MG tablet TAKE 1 TABLET(20 MG) BY MOUTH DAILY 90 tablet 0  . Simethicone (GAS-X PO) Take by mouth.    Marland Kitchen aspirin 81 MG tablet Take 81 mg by mouth daily.    Marland Kitchen LINZESS 290 MCG CAPS capsule TAKE 1 CAPSULE(290 MCG) BY MOUTH DAILY BEFORE BREAKFAST (Patient not taking: Reported on 01/07/2016) 30 capsule 0   No facility-administered medications prior to visit.     Review of Systems;  Patient denies headache, fevers, malaise, unintentional weight loss, skin rash, eye pain, sinus congestion and sinus pain, sore throat, dysphagia,  hemoptysis , cough, dyspnea, wheezing, chest pain, palpitations, orthopnea, edema, abdominal pain, nausea, melena, diarrhea, constipation, flank pain, dysuria, hematuria, urinary  Frequency, nocturia, numbness, tingling, seizures,  Focal weakness, Loss of consciousness,  Tremor, insomnia, depression, anxiety, and suicidal ideation.      Objective:  BP 128/70   Pulse 83   Temp 98.1 F (36.7 C) (Oral)   Resp 10   Ht 5' (1.524 m)   Wt 133 lb 8 oz (60.6 kg)   SpO2 99%   BMI 26.07 kg/m  BP Readings from Last 3 Encounters:  01/07/16 128/70  12/02/15 (!) 140/102  04/29/15 (!) 169/90    Wt Readings from Last 3 Encounters:  01/07/16 133 lb 8 oz (60.6 kg)  12/02/15 132 lb 6 oz (60 kg)  04/29/15 134 lb 11.2 oz (61.1 kg)    General appearance: alert, cooperative and appears stated age Ears: normal TM's and external ear canals both ears Throat: lips, mucosa, and tongue normal; teeth and gums normal Neck: no adenopathy, no carotid bruit, supple, symmetrical, trachea midline and thyroid not enlarged, symmetric, no  tenderness/mass/nodules Back: symmetric, no curvature. ROM normal. No CVA tenderness. Lungs: clear to auscultation bilaterally Heart: regular rate and rhythm, S1, S2 normal, no murmur, click, rub or gallop Abdomen: soft, non-tender; bowel sounds normal; no masses,  no organomegaly Pulses: 2+ and symmetric Skin: Skin color, texture, turgor normal. No rashes or lesions Lymph nodes: Cervical, supraclavicular, and axillary nodes normal.  Lab Results  Component Value Date   HGBA1C 5.8 01/07/2016    Lab Results  Component Value Date   CREATININE 0.83 12/02/2015   CREATININE 0.83 01/31/2015   CREATININE 0.94 09/10/2014    Lab Results  Component Value Date   WBC 5.9 12/02/2015   HGB 12.9 12/02/2015   HCT 37.7 12/02/2015   PLT 256.0 12/02/2015   GLUCOSE 102 (H) 12/02/2015   CHOL 193 01/31/2015   TRIG 158.0 (H) 01/31/2015   HDL 52.40 01/31/2015   LDLDIRECT 122.0 01/07/2016   LDLCALC 109 (H) 01/31/2015   ALT 17 12/02/2015   AST 18 12/02/2015   NA 140 12/02/2015   K 4.0 12/02/2015   CL 103 12/02/2015   CREATININE 0.83 12/02/2015   BUN 10 12/02/2015   CO2 31 12/02/2015   TSH 1.52 01/07/2016   HGBA1C 5.8 01/07/2016    No results found.  Assessment & Plan:   Problem List Items Addressed This Visit    Familial hyperlipidemia, high LDL     Based on current lipid profile and smoking history , the risk of clinically significant CAD is 11 to 18% over the next 10 years, using the Framingham risk calculator. The [employer] recommends starting patients aged 49 or higher on moderate intensity statin therapy for LDL between 70-189 and 10 yr risk of CAD > 7.5% ;  She is taking pravastatin .and LDL is 122, LFTs are normal     Lab Results  Component Value Date   CHOL 193 01/31/2015   HDL 52.40 01/31/2015   LDLCALC 109 (H) 01/31/2015   LDLDIRECT 122.0 01/07/2016   TRIG 158.0 (H) 01/31/2015   CHOLHDL 4 01/31/2015            Hepatic steatosis    Presumed by  ultrasound changes and serologies negative for autoimmune causes of hepatitis.  Current liver enzymes are normal and all modifiable risk factors including obesity, diabetes and hyperlipidemia have been addressed .  Hepatitis A/B vaccines have been given  Lab Results  Component Value Date   ALT 17 12/02/2015   AST 18 12/02/2015   ALKPHOS 98 12/02/2015   BILITOT 0.5 12/02/2015         Pap smear abnormality of cervix/human papillomavirus (HPV) positive    Repeat PAP was normal by Dr Marcelline Mates Feb 2017      Chronic abdominal pain    attributed to IBS given chronicity, normal CT in 2013,  normal colonoscopy Jan 2017, and normal pelvic workup by Dr Marcelline Mates.  IBS diet given,  Trial of amitiza  at lowest dose.        Prediabetes    Her random glucose is again elevated but not diagnostic of diabetes .  I recommend he follow a low glycemic index diet and particpate regularly in an aerobic  exercise activity.  We should check an A1c in  6 months.       Relevant Orders   Hemoglobin A1c (Completed)    Other Visit Diagnoses    Hyperlipidemia LDL goal <100    -  Primary   Relevant Orders   LDL cholesterol, direct (Completed)   Altered bowel function       Relevant Orders   Magnesium (Completed)   TSH (Completed)   Encounter for immunization       Relevant Orders   Flu vaccine HIGH DOSE PF (Completed)      I am having Ms. Perazzo maintain her Simethicone (GAS-X PO), bismuth subsalicylate, aspirin, pantoprazole, dicyclomine, pravastatin, and LINZESS.  No orders of the defined types were placed in this encounter.   There are no discontinued medications.  Follow-up: No Follow-up on file.   Crecencio Mc, MD

## 2016-01-08 DIAGNOSIS — Z23 Encounter for immunization: Secondary | ICD-10-CM | POA: Diagnosis not present

## 2016-01-09 ENCOUNTER — Encounter: Payer: Self-pay | Admitting: General Surgery

## 2016-01-10 DIAGNOSIS — R7303 Prediabetes: Secondary | ICD-10-CM | POA: Insufficient documentation

## 2016-01-10 NOTE — Assessment & Plan Note (Signed)
Repeat PAP was normal by Dr Marcelline Mates Feb 2017

## 2016-01-10 NOTE — Assessment & Plan Note (Signed)
attributed to IBS given chronicity, normal CT in 2013,  normal colonoscopy Jan 2017, and normal pelvic workup by Dr Marcelline Mates.  IBS diet given,  Trial of amitiza at lowest dose.

## 2016-01-10 NOTE — Assessment & Plan Note (Signed)
Presumed by ultrasound changes and serologies negative for autoimmune causes of hepatitis.  Current liver enzymes are normal and all modifiable risk factors including obesity, diabetes and hyperlipidemia have been addressed .  Hepatitis A/B vaccines have been given  Lab Results  Component Value Date   ALT 17 12/02/2015   AST 18 12/02/2015   ALKPHOS 98 12/02/2015   BILITOT 0.5 12/02/2015

## 2016-01-10 NOTE — Assessment & Plan Note (Addendum)
Based on current lipid profile and smoking history , the risk of clinically significant CAD is 11 to 18% over the next 10 years, using the Framingham risk calculator. The [employer] recommends starting patients aged 68 or higher on moderate intensity statin therapy for LDL between 70-189 and 10 yr risk of CAD > 7.5% ;  She is taking pravastatin .and LDL is 122, LFTs are normal     Lab Results  Component Value Date   CHOL 193 01/31/2015   HDL 52.40 01/31/2015   LDLCALC 109 (H) 01/31/2015   LDLDIRECT 122.0 01/07/2016   TRIG 158.0 (H) 01/31/2015   CHOLHDL 4 01/31/2015

## 2016-01-10 NOTE — Assessment & Plan Note (Signed)
Her random glucose is again elevated but not diagnostic of diabetes .  I recommend he follow a low glycemic index diet and particpate regularly in an aerobic  exercise activity.  We should check an A1c in 6 months.  

## 2016-01-13 ENCOUNTER — Telehealth: Payer: Self-pay | Admitting: *Deleted

## 2016-01-13 NOTE — Telephone Encounter (Signed)
Patient notified of lab result.

## 2016-01-13 NOTE — Telephone Encounter (Signed)
Pt requested lab results Pt contact [phone]

## 2016-01-22 ENCOUNTER — Encounter: Payer: Self-pay | Admitting: General Surgery

## 2016-01-22 ENCOUNTER — Ambulatory Visit (INDEPENDENT_AMBULATORY_CARE_PROVIDER_SITE_OTHER): Payer: Medicare Other | Admitting: General Surgery

## 2016-01-22 VITALS — BP 142/82 | HR 74 | Resp 12 | Ht 61.0 in | Wt 134.0 lb

## 2016-01-22 DIAGNOSIS — N6099 Unspecified benign mammary dysplasia of unspecified breast: Secondary | ICD-10-CM | POA: Diagnosis not present

## 2016-01-22 NOTE — Progress Notes (Signed)
Patient ID: Sharon Webster, female   DOB: [birth year], 70 y.o.   MRN: [id number]  Chief Complaint  Patient presents with  . Follow-up    mammogram    HPI Sharon Webster is a 70 y.o. female who presents for a breast evaluation. The most recent mammogram was done on 01/08/2016.  Patient does perform regular self breast checks and gets regular mammograms done.    Patient states she feels gassy. She states she wakes up most every night with pain in her lower abdomen area. This has been occurring for years.   HPI  Past Medical History:  Diagnosis Date  . Bowel trouble   . Breast screening, unspecified    atypical ductal hyperplasia, declined excision.  . Colon polyp 2011  . COPD (chronic obstructive pulmonary disease) ([place])   . Diverticulosis   . Duodenitis   . GERD (gastroesophageal reflux disease)   . Glaucoma   . Lump or mass in breast   . Personal history of tobacco use, presenting hazards to health   . Special screening for malignant neoplasms, colon     Past Surgical History:  Procedure Laterality Date  . BREAST BIOPSY  12/14/11   The patient underwent a Finesse biopsy of the right breast December 14, 2011. This was completed as her FNA was inconclusive, with atypical ductal cells identified.  . COLONOSCOPY  08/20/2009   Tubular adenoma x2. Transverse and descending colon Dr. Dionne Milo  . COLONOSCOPY WITH PROPOFOL N/A 04/02/2015   Tubular adenoma. F/U 2022.  Surgeon: Robert Bellow, MD;  Location: [facility] ENDOSCOPY;  Service: Endoscopy;  Laterality: N/A;    Family History  Problem Relation Age of Onset  . Heart disease Mother   . COPD Mother   . Hypertension Mother   . Cancer Father     metastatic prostate CA  . Stroke Sister     hemorrhagic CVA  . Heart disease Sister     restrictive cardiomyopathy  . Aneurysm Brother     Social History Social History  Substance Use Topics  . Smoking status: Former Smoker    Packs/day: 1.00    Years: 20.00    Types:  Cigarettes    Quit date: 05/15/2001  . Smokeless tobacco: Never Used  . Alcohol use 3.6 oz/week    6 Cans of beer per week    No Known Allergies  Current Outpatient Prescriptions  Medication Sig Dispense Refill  . bismuth subsalicylate (PEPTO BISMOL) 262 MG chewable tablet Chew 524 mg by mouth as needed.    . dicyclomine (BENTYL) 20 MG tablet TAKE 1 TABLET BY MOUTH THREE TIMES DAILY BEFORE MEALS 90 tablet 1  . pantoprazole (PROTONIX) 40 MG tablet Take 1 tablet (40 mg total) by mouth daily. 90 tablet 3  . pravastatin (PRAVACHOL) 20 MG tablet TAKE 1 TABLET(20 MG) BY MOUTH DAILY 90 tablet 0  . Simethicone (GAS-X PO) Take by mouth.     No current facility-administered medications for this visit.     Review of Systems Review of Systems  Constitutional: Negative.   Respiratory: Negative.   Cardiovascular: Negative.     Blood pressure (!) 142/82, pulse 74, resp. rate 12, height 5\' 1"  (1.549 m), weight 134 lb (60.8 kg).  Physical Exam Physical Exam  Constitutional: She is oriented to person, place, and time. She appears well-developed and well-nourished.  Eyes: Conjunctivae are normal. No scleral icterus.  Neck: Neck supple.  Cardiovascular: Normal rate, regular rhythm and normal heart sounds.   Pulmonary/Chest: Effort normal and  breath sounds normal. Right breast exhibits no inverted nipple, no mass, no nipple discharge, no skin change and no tenderness. Left breast exhibits no inverted nipple, no mass, no nipple discharge, no skin change and no tenderness.  Abdominal: Soft. Bowel sounds are normal. There is no tenderness.  Lymphadenopathy:    She has no cervical adenopathy.    She has no axillary adenopathy.  Neurological: She is alert and oriented to person, place, and time.  Skin: Skin is warm and dry.    Data Reviewed Screening mammograms completed at [facility] on January 08, 2016 showed no interval change. BIRAD 2.   August 2013 CT for a dominant plane and bloating  showed no explanatory pathology.   Assessment     Benign breast exam  Intermittent abdominal pain. .      Plan         The patient has been asked to return to the office in one year with a bilateral screening mammogram.  This information has been scribed by Gaspar Cola CMA.  Robert Bellow 01/22/2016, 8:25 PM

## 2016-01-22 NOTE — Patient Instructions (Signed)
The patient has been asked to return to the office in one year with a bilateral screening mammogram. 

## 2016-02-18 ENCOUNTER — Ambulatory Visit (INDEPENDENT_AMBULATORY_CARE_PROVIDER_SITE_OTHER): Payer: Medicare Other

## 2016-02-18 VITALS — BP 136/80 | HR 88 | Temp 98.3°F | Resp 12 | Ht 61.0 in | Wt 133.8 lb

## 2016-02-18 DIAGNOSIS — E2839 Other primary ovarian failure: Secondary | ICD-10-CM | POA: Diagnosis not present

## 2016-02-18 DIAGNOSIS — Z0000 Encounter for general adult medical examination without abnormal findings: Secondary | ICD-10-CM

## 2016-02-18 NOTE — Progress Notes (Signed)
Subjective:   Sharon Webster is a 70 y.o. female who presents for Medicare Annual (Subsequent) preventive examination.  Review of Systems:  No ROS.  Medicare Wellness Visit.  Cardiac Risk Factors include: advanced age (>31men, >50 women)     Objective:     Vitals: BP 136/80 (BP Location: Right Arm, Patient Position: Sitting, Cuff Size: Normal)   Pulse 88   Temp 98.3 F (36.8 C) (Oral)   Resp 12   Ht 5\' 1"  (1.549 m)   Wt 133 lb 12.8 oz (60.7 kg)   SpO2 98%   BMI 25.28 kg/m   Body mass index is 25.28 kg/m.   Tobacco History  Smoking Status  . Former Smoker  . Packs/day: 1.00  . Years: 20.00  . Types: Cigarettes  . Quit date: 05/15/2001  Smokeless Tobacco  . Never Used     Counseling given: Not Answered   Past Medical History:  Diagnosis Date  . Bowel trouble   . Breast screening, unspecified    atypical ductal hyperplasia, declined excision.  . Colon polyp 2011  . COPD (chronic obstructive pulmonary disease) ([place])   . Diverticulosis   . Duodenitis   . GERD (gastroesophageal reflux disease)   . Glaucoma   . Lump or mass in breast   . Personal history of tobacco use, presenting hazards to health   . Special screening for malignant neoplasms, colon    Past Surgical History:  Procedure Laterality Date  . BREAST BIOPSY  12/14/11   The patient underwent a Finesse biopsy of the right breast December 14, 2011. This was completed as her FNA was inconclusive, with atypical ductal cells identified.  . COLONOSCOPY  08/20/2009   Tubular adenoma x2. Transverse and descending colon Dr. Dionne Milo  . COLONOSCOPY WITH PROPOFOL N/A 04/02/2015   Tubular adenoma. F/U 2022.  Surgeon: Robert Bellow, MD;  Location[facility] ENDOSCOPY;  Service: Endoscopy;  Laterality: N/A;   Family History  Problem Relation Age of Onset  . Heart disease Mother   . COPD Mother   . Hypertension Mother   . Cancer Father     metastatic prostate CA  . Stroke Sister     hemorrhagic CVA  .  Heart disease Sister     restrictive cardiomyopathy  . Aneurysm Brother    History  Sexual Activity  . Sexual activity: Not Currently    Outpatient Encounter Prescriptions as of 02/18/2016  Medication Sig  . dicyclomine (BENTYL) 20 MG tablet TAKE 1 TABLET BY MOUTH THREE TIMES DAILY BEFORE MEALS  . pantoprazole (PROTONIX) 40 MG tablet Take 1 tablet (40 mg total) by mouth daily.  . pravastatin (PRAVACHOL) 20 MG tablet TAKE 1 TABLET(20 MG) BY MOUTH DAILY  . bismuth subsalicylate (PEPTO BISMOL) 262 MG chewable tablet Chew 524 mg by mouth as needed.  . Simethicone (GAS-X PO) Take by mouth.   No facility-administered encounter medications on file as of 02/18/2016.     Activities of Daily Living In your present state of health, do you have any difficulty performing the following activities: 02/18/2016 02/18/2015  Hearing? N N  Vision? N N  Difficulty concentrating or making decisions? Y N  Walking or climbing stairs? N N  Dressing or bathing? N N  Doing errands, shopping? N N  Preparing Food and eating ? N N  Using the Toilet? N N  In the past six months, have you accidently leaked urine? N N  Do you have problems with loss of bowel control? N N  Managing your Medications? N N  Managing your Finances? N N  Housekeeping or managing your Housekeeping? N N  Some recent data might be hidden    Patient Care Team: Crecencio Mc, MD as PCP - General (Internal Medicine) Robert Bellow, MD (General Surgery) Crecencio Mc, MD (Internal Medicine)    Assessment:    This is a routine wellness examination for [place]. The goal of the wellness visit is to assist the patient how to close the gaps in care and create a preventative care plan for the patient.   Taking calcium VIT D as appropriate/Osteoporosis risk reviewed.  DEXA SCAN ordered; follow as directed.  Medications reviewed; taking without issues or barriers.  Safety issues reviewed; lives alone.  Smoke detectors in the  home. Firearms locked in a safe within the home. Wears seatbelts when driving or riding with others. No violence in the home.  No identified risk were noted; The patient was oriented x 3; appropriate in dress and manner and no objective failures at ADL's or IADL's.   BMI; discussed the importance of a healthy diet, water intake and exercise. Educational material provided.   ZOSTAVAX and PNA 23 vaccine deferred per patient request.  Educational material provided.  Patient Concerns: None at this time. Follow up with PCP as needed.  Exercise Activities and Dietary recommendations Current Exercise Habits: Home exercise routine (Dancing. Swimming.), Intensity: Moderate  Goals    . Increase physical activity          Keep walking regimen and start swimming with Silver Sneakers     . Increase water intake          STAY HYDRATED AND DRINK PLENTY OF FLUIDS      Fall Risk Fall Risk  02/18/2016 02/18/2015  Falls in the past year? Yes No  Number falls in past yr: 1 -  Injury with Fall? No -  Follow up Falls prevention discussed -   Depression Screen PHQ 2/9 Scores 02/18/2016 02/18/2015  PHQ - 2 Score 0 0     Cognitive Function MMSE - Mini Mental State Exam 02/18/2016 02/18/2015  Orientation to time 5 5  Orientation to Place 5 5  Registration 3 3  Attention/ Calculation 5 5  Recall 3 3  Language- name 2 objects 2 2  Language- repeat 1 1  Language- follow 3 step command 3 3  Language- read & follow direction 1 1  Write a sentence 1 1  Copy design 1 1  Total score 30 30        Immunization History  Administered Date(s) Administered  . Hep A / Hep B 07/31/2014, 09/03/2014, 02/04/2015  . Influenza Split 02/04/2014, 01/31/2015  . Influenza, High Dose Seasonal PF 01/08/2016  . Influenza,inj,Quad PF,36+ Mos 01/31/2015  . Influenza-Unspecified 02/12/2013  . Pneumococcal Conjugate-13 01/31/2015  . Tdap 01/06/2010   Screening Tests Health Maintenance  Topic Date Due  .  DEXA SCAN  08/13/2011  . PNA vac Low Risk Adult (2 of 2 - PPSV23) 01/31/2016  . ZOSTAVAX  03/14/2017 (Originally 08/13/2006)  . MAMMOGRAM  01/07/2018  . TETANUS/TDAP  01/07/2020  . COLONOSCOPY  04/01/2025  . INFLUENZA VACCINE  Completed  . Hepatitis C Screening  Completed      Plan:    End of life planning; Advance aging; Advanced directives discussed. No HCPOA/Living Will.  Additional information declined.    Medicare Attestation I have personally reviewed: The patient's medical and social history Their use of alcohol, tobacco or illicit  drugs Their current medications and supplements The patient's functional ability including ADLs,fall risks, home safety risks, cognitive, and hearing and visual impairment Diet and physical activities Evidence for depression   The patient's weight, height, BMI, and visual acuity have been recorded in the chart.  I have made referrals and provided education to the patient based on review of the above and I have provided the patient with a written personalized care plan for preventive services.    During the course of the visit the patient was educated and counseled about the following appropriate screening and preventive services:   Vaccines to include Pneumoccal, Influenza, Hepatitis B, Td, Zostavax, HCV  Electrocardiogram  Cardiovascular Disease  Colorectal cancer screening  Bone density screening  Diabetes screening  Glaucoma screening  Mammography/PAP  Nutrition counseling   Patient Instructions (the written plan) was given to the patient.   Varney Biles, LPN  [phone]

## 2016-02-18 NOTE — Patient Instructions (Addendum)
Sharon Webster , Thank you for taking time to come for your Medicare Wellness Visit. I appreciate your ongoing commitment to your health goals. Please review the following plan we discussed and let me know if I can assist you in the future.   FOLLOW UP WITH DR. Derrel Nip AS NEEDED.  These are the goals we discussed: Goals    . Increase physical activity          Keep walking regimen and start swimming with Silver Sneakers     . Increase water intake          STAY HYDRATED AND DRINK PLENTY OF FLUIDS       This is a list of the screening recommended for you and due dates:  Health Maintenance  Topic Date Due  . DEXA scan (bone density measurement)  08/13/2011  . Pneumonia vaccines (2 of 2 - PPSV23) 01/31/2016  . Shingles Vaccine  03/14/2017*  . Mammogram  01/07/2018  . Tetanus Vaccine  01/07/2020  . Colon Cancer Screening  04/01/2025  . Flu Shot  Completed  .  Hepatitis C: One time screening is recommended by Center for Disease Control  (CDC) for  adults born from [birth year] through 1964.   Completed  *Topic was postponed. The date shown is not the original due date.   Bone Densitometry Introduction Bone densitometry is an imaging test that uses a special X-ray to measure the amount of calcium and other minerals in your bones (bone density). This test is also known as a bone mineral density test or dual-energy X-ray absorptiometry (DXA). The test can measure bone density at your hip and your spine. It is similar to having a regular X-ray. You may have this test to:  Diagnose a condition that causes weak or thin bones (osteoporosis).  Predict your risk of a broken bone (fracture).  Determine how well osteoporosis treatment is working. Tell a health care provider about:  Any allergies you have.  All medicines you are taking, including vitamins, herbs, eye drops, creams, and over-the-counter medicines.  Any problems you or family members have had with anesthetic medicines.  Any blood  disorders you have.  Any surgeries you have had.  Any medical conditions you have.  Possibility of pregnancy.  Any other medical test you had within the previous 14 days that used contrast material. What are the risks? Generally, this is a safe procedure. However, problems can occur and may include the following:  This test exposes you to a very small amount of radiation.  The risks of radiation exposure may be greater to unborn children. What happens before the procedure?  Do not take any calcium supplements for 24 hours before having the test. You can otherwise eat and drink what you usually do.  Take off all metal jewelry, eyeglasses, dental appliances, and any other metal objects. What happens during the procedure?  You may lie on an exam table. There will be an X-ray generator below you and an imaging device above you.  Other devices, such as boxes or braces, may be used to position your body properly for the scan.  You will need to lie still while the machine slowly scans your body.  The images will show up on a computer monitor. What happens after the procedure? You may need more testing at a later time. This information is not intended to replace advice given to you by your health care provider. Make sure you discuss any questions you have with your health care  provider. Document Released: 03/23/2004 Document Revised: 08/07/2015 Document Reviewed: 08/09/2013  2017 Elsevier

## 2016-02-24 NOTE — Progress Notes (Signed)
  I have reviewed the above information and agree with above.   Darnisha Vernet, MD 

## 2016-03-05 ENCOUNTER — Other Ambulatory Visit: Payer: Self-pay | Admitting: Internal Medicine

## 2016-03-16 ENCOUNTER — Other Ambulatory Visit: Payer: Self-pay | Admitting: Internal Medicine

## 2016-03-17 ENCOUNTER — Encounter: Payer: Self-pay | Admitting: Internal Medicine

## 2016-04-27 ENCOUNTER — Ambulatory Visit
Admission: RE | Admit: 2016-04-27 | Discharge: 2016-04-27 | Disposition: A | Discharge status: Home / Self Care | Payer: Medicare Other | Source: Ambulatory Visit | Attending: Internal Medicine | Admitting: Internal Medicine

## 2016-04-27 DIAGNOSIS — M8588 Other specified disorders of bone density and structure, other site: Secondary | ICD-10-CM | POA: Diagnosis not present

## 2016-04-27 DIAGNOSIS — E2839 Other primary ovarian failure: Secondary | ICD-10-CM | POA: Diagnosis present

## 2016-04-30 ENCOUNTER — Telehealth: Payer: Self-pay | Admitting: Internal Medicine

## 2016-04-30 NOTE — Telephone Encounter (Signed)
Pt informed   Sharon Mc, MD  Cresenciano Lick, CMA         You have osteopenia by recent DEXA , so Your risk of fracture in the next 10 yrs is about 5% . I recommend weight bearing exercise, 1200 mg calcium through diet /supplements , 1000 units Vit D daily And a Repeat DEXA in 2 years .   I recommend getting the majority of your calcium and Vitamin D through dietary sources rather than supplements given the recent association of calcium supplements with increased coronary artery calcium scores    almond/coconut milk and cashew milk are great nondairy alternatives to dairy sources and are low calorie low carb, and cholesterol free .   Regards,   Dr. Derrel Nip

## 2016-04-30 NOTE — Telephone Encounter (Signed)
Pt called back returning your call in regards to labs. Thank you!  Call pt @ [phone]

## 2016-06-15 ENCOUNTER — Other Ambulatory Visit: Payer: Self-pay | Admitting: Family Medicine

## 2016-06-15 ENCOUNTER — Other Ambulatory Visit: Payer: Self-pay | Admitting: Internal Medicine

## 2016-06-29 ENCOUNTER — Other Ambulatory Visit: Payer: Self-pay

## 2016-06-29 MED ORDER — DICYCLOMINE HCL 20 MG PO TABS: ORAL_TABLET | ORAL | 2 refills | Status: DC

## 2016-06-29 NOTE — Telephone Encounter (Signed)
Refilled: 03/05/2016 Last OV: 01/07/2016 Next OV: not shceduled

## 2016-09-13 ENCOUNTER — Other Ambulatory Visit: Payer: Self-pay | Admitting: Internal Medicine

## 2016-11-01 ENCOUNTER — Other Ambulatory Visit: Payer: Self-pay | Admitting: Internal Medicine

## 2016-11-16 ENCOUNTER — Other Ambulatory Visit: Payer: Self-pay | Admitting: Family Medicine

## 2016-11-26 ENCOUNTER — Other Ambulatory Visit: Payer: Self-pay | Admitting: Internal Medicine

## 2016-12-13 ENCOUNTER — Other Ambulatory Visit: Payer: Self-pay | Admitting: Internal Medicine

## 2017-01-13 ENCOUNTER — Encounter: Payer: Self-pay | Admitting: General Surgery

## 2017-01-14 ENCOUNTER — Telehealth: Payer: Self-pay

## 2017-01-14 NOTE — Telephone Encounter (Signed)
Message left for patient to call back to reschedule her appointment. She was scheduled with the incorrect provider.

## 2017-01-17 ENCOUNTER — Ambulatory Visit: Payer: Self-pay | Admitting: General Surgery

## 2017-01-18 ENCOUNTER — Inpatient Hospital Stay: Payer: Self-pay

## 2017-01-18 ENCOUNTER — Encounter: Payer: Self-pay | Admitting: General Surgery

## 2017-01-18 ENCOUNTER — Ambulatory Visit: Payer: Medicare Other | Admitting: General Surgery

## 2017-01-18 VITALS — BP 168/86 | HR 84 | Resp 12 | Ht 60.0 in | Wt 132.0 lb

## 2017-01-18 DIAGNOSIS — R928 Other abnormal and inconclusive findings on diagnostic imaging of breast: Secondary | ICD-10-CM

## 2017-01-18 DIAGNOSIS — N6099 Unspecified benign mammary dysplasia of unspecified breast: Secondary | ICD-10-CM | POA: Diagnosis not present

## 2017-01-18 DIAGNOSIS — N6321 Unspecified lump in the left breast, upper outer quadrant: Secondary | ICD-10-CM

## 2017-01-18 NOTE — Progress Notes (Addendum)
Patient ID: Sharon Webster, female   DOB: 1946/02/07, 71 y.o.   MRN: [id number]  Chief Complaint  Patient presents with  . Follow-up    HPI Sharon Webster is a 71 y.o. female who presents for a breast evaluation. The most recent mammogram was done on 01/12/2017.  Patient does perform regular self breast checks and gets regular mammograms done.  No new breast issues.  HPI  Past Medical History:  Diagnosis Date  . Bowel trouble   . Breast screening, unspecified    atypical ductal hyperplasia, declined excision.  . Colon polyp 2011  . COPD (chronic obstructive pulmonary disease) ([place])   . Diverticulosis   . Duodenitis   . GERD (gastroesophageal reflux disease)   . Glaucoma   . Lump or mass in breast   . Personal history of tobacco use, presenting hazards to health   . Special screening for malignant neoplasms, colon     Past Surgical History:  Procedure Laterality Date  . BREAST BIOPSY Right 12/14/2011   3:00, Finesse biopsy,December 14, 2011. This was completed as her FNA showed atypia, papilloma, "features" of  ADH  identified. Declined formal excision. I elected not to offer chemoprevention without formal excision. .(January 13, 2012.)   . COLONOSCOPY  08/20/2009   Tubular adenoma x2. Transverse and descending colon Dr. Dionne Milo    Family History  Problem Relation Age of Onset  . Heart disease Mother   . COPD Mother   . Hypertension Mother   . Cancer Father        metastatic prostate CA  . Stroke Sister        hemorrhagic CVA  . Heart disease Sister        restrictive cardiomyopathy  . Aneurysm Brother     Social History Social History   Tobacco Use  . Smoking status: Former Smoker    Packs/day: 1.00    Years: 20.00    Pack years: 20.00    Types: Cigarettes    Last attempt to quit: 05/15/2001    Years since quitting: 15.6  . Smokeless tobacco: Never Used  Substance Use Topics  . Alcohol use: Yes    Alcohol/week: 3.6 oz    Types: 6 Cans of beer per  week  . Drug use: No    No Known Allergies  Current Outpatient Medications  Medication Sig Dispense Refill  . bismuth subsalicylate (PEPTO BISMOL) 262 MG chewable tablet Chew 524 mg by mouth as needed.    . dicyclomine (BENTYL) 20 MG tablet TAKE 1 TABLET BY MOUTH THREE TIMES DAILY BEFORE MEALS 90 tablet 0  . pantoprazole (PROTONIX) 40 MG tablet TAKE 1 TABLET(40 MG) BY MOUTH DAILY 90 tablet 0  . pravastatin (PRAVACHOL) 20 MG tablet TAKE 1 TABLET(20 MG) BY MOUTH DAILY 90 tablet 0  . Simethicone (GAS-X PO) Take by mouth.     No current facility-administered medications for this visit.     Review of Systems Review of Systems  Constitutional: Negative.   Respiratory: Negative.   Cardiovascular: Negative.     Blood pressure (!) 168/86, pulse 84, resp. rate 12, height 5' (1.524 m), weight 132 lb (59.9 kg).  Physical Exam Physical Exam  Constitutional: She is oriented to person, place, and time. She appears well-developed and well-nourished.  HENT:  Mouth/Throat: Oropharynx is clear and moist.  Eyes: Conjunctivae are normal. No scleral icterus.  Neck: Neck supple.  Cardiovascular: Normal rate, regular rhythm and normal heart sounds.  Pulmonary/Chest: Effort normal and breath sounds normal. Right  breast exhibits no inverted nipple, no mass, no nipple discharge, no skin change and no tenderness. Left breast exhibits no inverted nipple, no mass, no nipple discharge, no skin change and no tenderness.    Left breast > right breast, slightly.   Lymphadenopathy:    She has no cervical adenopathy.    She has no axillary adenopathy.  Neurological: She is alert and oriented to person, place, and time.  Skin: Skin is warm and dry.  Psychiatric: Her behavior is normal.    Data Reviewed Bilateral diagnostic mammograms dated 01/12/2017 completed at [facility] were reviewed. Questionable abnormality in the soft  tissue appreciated only on the [facility] views.BIRAD-0.  Ultrasound examination of the upper  outer quadrant of the  Left breast was completed with  To determine if the nodular area appreciated on the recent mammograms to be identified. There is not appear to be a soft tissue (between the skin in the breast parenchyma correlate with the above-mentiod mammographic findings. Deep in the breast, adjacent to the pectoralis fascia a small hypoechoic mass with focal posterior acoustic shadowing measuring 0.47 x 0.51 x 0.56 cm was noted to 30 o'clock position, 6 cm from the nipple. This is unlikely to correlate with the mammographic abnormality. BIRAD-4.  Assessment    Abnormal mammogram and ultrasound left breast.    Plan    I suspect to the mammographic abnormality may be related to the keratosis identified on clinical exam. The patient is scheduled for additional views later this month, and has been asked to call when these studies are completed so they can be independently reviewed.  The patient may still benefit from biopsy of the ultrasound abnormality, but we will defer this until her upcoming diagnostic mammogram.     Follow up pending added views on 01-31-17, she will call after mammograms completed.  HPI, Physical Exam, Assessment and Plan have been scribed under the direction and in the presence of Robert Bellow, MD. Karie Fetch, RN  I have completed the exam and reviewed the above documentation for accuracy and completeness.  I agree with the above.  Haematologist has been used and any errors in dictation or transcription are unintentional.  Hervey Ard, M.D., F.A.C.S.   Robert Bellow 01/20/2017, 8:23 AM

## 2017-01-18 NOTE — Patient Instructions (Addendum)
The patient is aware to call back for any questions or concerns.  

## 2017-01-19 ENCOUNTER — Encounter: Payer: Self-pay | Admitting: General Surgery

## 2017-01-20 ENCOUNTER — Encounter: Payer: Self-pay | Admitting: General Surgery

## 2017-01-31 ENCOUNTER — Telehealth: Payer: Self-pay | Admitting: *Deleted

## 2017-01-31 NOTE — Telephone Encounter (Signed)
Patient called and wanted to let you that she had her additional views today at [facility] in [place],

## 2017-02-01 ENCOUNTER — Encounter: Payer: Self-pay | Admitting: General Surgery

## 2017-02-08 ENCOUNTER — Telehealth: Payer: Self-pay | Admitting: *Deleted

## 2017-02-08 NOTE — Telephone Encounter (Signed)
Notified patient as instructed, patient pleased. Discussed follow-up appointments, patient agrees  

## 2017-02-08 NOTE — Telephone Encounter (Signed)
-----   Message from Robert Bellow, MD sent at 02/08/2017 12:06 PM EST ----- Please notify repeat films OK. F/U next year as scheduled.  ----- Message ----- From: Augustin Schooling, CMA Sent: 02/01/2017  10:06 AM To: Robert Bellow, MD

## 2017-02-12 ENCOUNTER — Other Ambulatory Visit: Payer: Self-pay | Admitting: Family Medicine

## 2017-02-15 ENCOUNTER — Other Ambulatory Visit: Payer: Self-pay | Admitting: Internal Medicine

## 2017-02-17 ENCOUNTER — Ambulatory Visit (INDEPENDENT_AMBULATORY_CARE_PROVIDER_SITE_OTHER): Payer: Medicare Other

## 2017-02-17 VITALS — BP 130/70 | HR 76 | Temp 98.6°F | Resp 14 | Ht 60.0 in | Wt 133.8 lb

## 2017-02-17 DIAGNOSIS — Z1331 Encounter for screening for depression: Secondary | ICD-10-CM

## 2017-02-17 DIAGNOSIS — Z0000 Encounter for general adult medical examination without abnormal findings: Secondary | ICD-10-CM

## 2017-02-17 NOTE — Patient Instructions (Addendum)
  Sharon Webster , Thank you for taking time to come for your Medicare Wellness Visit. I appreciate your ongoing commitment to your health goals. Please review the following plan we discussed and let me know if I can assist you in the future.   Follow up with Dr.Tullo for annual physical, come fasting for labs.   Have a great day and Merry Christmas!  These are the goals we discussed: Goals    . Healthy Lifestyle     Increase water intake Healthy foods Walk for exercise       This is a list of the screening recommended for you and due dates:  Health Maintenance  Topic Date Due  . Pneumonia vaccines (2 of 2 - PPSV23) 01/31/2016  . Flu Shot  10/13/2016  . Mammogram  02/01/2019  . Tetanus Vaccine  01/07/2020  . Colon Cancer Screening  04/01/2025  . DEXA scan (bone density measurement)  Completed  .  Hepatitis C: One time screening is recommended by Center for Disease Control  (CDC) for  adults born from [birth year] through 1964.   Completed

## 2017-02-17 NOTE — Progress Notes (Signed)
Subjective:   Sharon Webster is a 71 y.o. female who presents for Medicare Annual (Subsequent) preventive examination.  Review of Systems:  No ROS.  Medicare Wellness Visit. Additional risk factors are reflected in the social history.  Cardiac Risk Factors include: advanced age (>19men, >29 women)     Objective:     Vitals: BP 130/70 (BP Location: Right Arm, Patient Position: Sitting, Cuff Size: Normal)   Pulse 76   Temp 98.6 F (37 C) (Oral)   Resp 14   Ht 5' (1.524 m)   Wt 133 lb 12.8 oz (60.7 kg)   SpO2 98%   BMI 26.13 kg/m   Body mass index is 26.13 kg/m.  Advanced Directives 02/17/2017 02/18/2016 02/18/2015  Does Patient Have a Medical Advance Directive? No No Yes  Type of Advance Directive - - Living will;Healthcare Power of Attorney  Does patient want to make changes to medical advance directive? - - No - Patient declined  Copy of [place] in Chart? - - No - copy requested  Would patient like information on creating a medical advance directive? No - Patient declined No - Patient declined -    Tobacco Social History   Tobacco Use  Smoking Status Former Smoker  . Packs/day: 1.00  . Years: 20.00  . Pack years: 20.00  . Types: Cigarettes  . Last attempt to quit: 05/15/2001  . Years since quitting: 15.7  Smokeless Tobacco Never Used     Counseling given: Not Answered   Clinical Intake:  Pre-visit preparation completed: Yes  Pain : No/denies pain     Nutritional Status: BMI 25 -29 Overweight Diabetes: No  Activities of Daily Living: Independent Ambulation: Independent Medication Administration: Independent Home Management: Independent     Do you feel unsafe in your current relationship?: No Do you feel physically threatened by others?: No Anyone hurting you at home, work, or school?: No Unable to ask?: No  How often do you need to have someone help you when you read instructions, pamphlets, or other written materials from  your doctor or pharmacy?: 1 - Never  Interpreter Needed?: No     Past Medical History:  Diagnosis Date  . Bowel trouble   . Breast screening, unspecified    atypical ductal hyperplasia, declined excision.  . Colon polyp 2011  . COPD (chronic obstructive pulmonary disease) ([place])   . Diverticulosis   . Duodenitis   . GERD (gastroesophageal reflux disease)   . Glaucoma   . Lump or mass in breast   . Personal history of tobacco use, presenting hazards to health   . Special screening for malignant neoplasms, colon    Past Surgical History:  Procedure Laterality Date  . BREAST BIOPSY Right 12/14/2011   3:00, [place] biopsy,December 14, 2011. This was completed as her FNA showed atypia, papilloma, "features" of  ADH  identified. Declined formal excision. I elected not to offer chemoprevention without formal excision. .(January 13, 2012.)   . COLONOSCOPY  08/20/2009   Tubular adenoma x2. Transverse and descending colon Dr. Dionne Milo  . COLONOSCOPY WITH PROPOFOL N/A 04/02/2015   Tubular adenoma. F/U 2022.  Surgeon: Robert Bellow, MD;  Location: [facility] ENDOSCOPY;  Service: Endoscopy;  Laterality: N/A;   Family History  Problem Relation Age of Onset  . Heart disease Mother   . COPD Mother   . Hypertension Mother   . Cancer Father        metastatic prostate CA  . Stroke Sister  hemorrhagic CVA  . Heart disease Sister        restrictive cardiomyopathy  . Aneurysm Brother    Social History   Socioeconomic History  . Marital status: Widowed    Spouse name: None  . Number of children: None  . Years of education: None  . Highest education level: None  Social Needs  . Financial resource strain: None  . Food insecurity - worry: None  . Food insecurity - inability: None  . Transportation needs - medical: None  . Transportation needs - non-medical: None  Occupational History  . None  Tobacco Use  . Smoking status: Former Smoker    Packs/day: 1.00    Years: 20.00    Pack  years: 20.00    Types: Cigarettes    Last attempt to quit: 05/15/2001    Years since quitting: 15.7  . Smokeless tobacco: Never Used  Substance and Sexual Activity  . Alcohol use: Yes    Alcohol/week: 1.8 oz    Types: 3 Cans of beer per week  . Drug use: No  . Sexual activity: Not Currently  Other Topics Concern  . None  Social History Narrative  . None    Outpatient Encounter Medications as of 02/17/2017  Medication Sig  . bismuth subsalicylate (PEPTO BISMOL) 262 MG chewable tablet Chew 524 mg by mouth as needed.  . dicyclomine (BENTYL) 20 MG tablet TAKE 1 TABLET BY MOUTH THREE TIMES DAILY BEFORE MEALS  . pantoprazole (PROTONIX) 40 MG tablet TAKE 1 TABLET(40 MG) BY MOUTH DAILY  . pravastatin (PRAVACHOL) 20 MG tablet TAKE 1 TABLET(20 MG) BY MOUTH DAILY  . Simethicone (GAS-X PO) Take by mouth.   No facility-administered encounter medications on file as of 02/17/2017.     Activities of Daily Living In your present state of health, do you have any difficulty performing the following activities: 02/17/2017 02/18/2016  Hearing? N N  Vision? N N  Difficulty concentrating or making decisions? N Y  Comment - Age appropriate memory delay  Walking or climbing stairs? N N  Dressing or bathing? N N  Doing errands, shopping? N N  Preparing Food and eating ? N N  Using the Toilet? N N  In the past six months, have you accidently leaked urine? N N  Do you have problems with loss of bowel control? N N  Managing your Medications? N N  Managing your Finances? N N  Housekeeping or managing your Housekeeping? N N  Some recent data might be hidden    Patient Care Team: Crecencio Mc, MD as PCP - General (Internal Medicine) Bary Castilla, Forest Gleason, MD (General Surgery) Crecencio Mc, MD (Internal Medicine)    Assessment:    This is a routine wellness examination for [name]. The goal of the wellness visit is to assist the patient how to close the gaps in care and create a preventative  care plan for the patient.   The roster of all physicians providing medical care to patient is listed in the Snapshot section of the chart.  Osteoporosis risk reviewed.    Safety issues reviewed; Smoke and carbon monoxide detectors in the home. No firearms in the home.  Wears seatbelts when driving or riding with others. Patient does wear sunscreen or protective clothing when in direct sunlight. No violence in the home.  Depression- PHQ 2 &9 complete. No thoughts of self harm or harm towards others.  Time spent on this topic is 10 minutes. She is having difficulty falling asleep  and staying asleep due to the management of her boyfriend's illness.  She is his primary caretaker.  Patient is alert, normal appearance, oriented to person/place/and time. Correctly identified the president of the [place], recall of 3/3 words, and performing simple calculations. Displays appropriate judgement and can read correct time from watch face.   No new identified risk were noted.  No failures at ADL's or IADL's.    BMI- discussed the importance of a healthy diet, water intake and the benefits of aerobic exercise. Educational material provided.   24 hour diet recall: Low dairy diet  Daily fluid intake: 3 cups of caffeine, 1 cups of water, 1 cup of juice.  Encouraged her to increase water intake, stay hydrated.  Dental- every 6 months.  [facility].  Eye- Visual acuity not assessed per patient preference. Wears corrective lenses.  Sleep patterns- Sleeps 6 hours at night.  Wakes feeling rested.  Pneumovax 23 vaccine deferred per patient preference.  Educational material provided.  Patient Concerns: None at this time. Follow up with PCP as needed.  Exercise Activities and Dietary recommendations    Goals    . Healthy Lifestyle     Increase water intake Healthy foods Walk for exercise      Fall Risk Fall Risk  02/17/2017 02/18/2016 02/18/2015  Falls in the past year? No Yes No  Number falls in  past yr: - 1 -  Injury with Fall? - No -  Follow up - Falls prevention discussed -  Comment - Uneven ground -   Depression Screen PHQ 2/9 Scores 02/17/2017 02/18/2016 02/18/2015  PHQ - 2 Score 1 0 0  PHQ- 9 Score 2 - -     Cognitive Function MMSE - Mini Mental State Exam 02/17/2017 02/18/2016 02/18/2015  Orientation to time 5 5 5   Orientation to Place 5 5 5   Registration 3 3 3   Attention/ Calculation 5 5 5   Recall 3 3 3   Language- name 2 objects 2 2 2   Language- repeat 1 1 1   Language- follow 3 step command 3 3 3   Language- read & follow direction 1 1 1   Write a sentence 1 1 1   Copy design 1 1 1   Total score 30 30 30         Immunization History  Administered Date(s) Administered  . Hep A / Hep B 07/31/2014, 09/03/2014, 02/04/2015  . Influenza Split 02/04/2014, 01/31/2015  . Influenza, High Dose Seasonal PF 01/08/2016  . Influenza,inj,Quad PF,6+ Mos 01/31/2015  . Influenza-Unspecified 02/12/2013  . Pneumococcal Conjugate-13 01/31/2015  . Tdap 01/06/2010   Screening Tests Health Maintenance  Topic Date Due  . PNA vac Low Risk Adult (2 of 2 - PPSV23) 01/31/2016  . INFLUENZA VACCINE  10/13/2016  . MAMMOGRAM  02/01/2019  . TETANUS/TDAP  01/07/2020  . COLONOSCOPY  04/01/2025  . DEXA SCAN  Completed  . Hepatitis C Screening  Completed      Plan:   End of life planning; Advanced aging; Advanced directives discussed.  No HCPOA/Living Will.  Additional information declined at this time.  I have personally reviewed and noted the following in the patient's chart:   . Medical and social history . Use of alcohol, tobacco or illicit drugs  . Current medications and supplements . Functional ability and status . Nutritional status . Physical activity . Advanced directives . List of other physicians . Hospitalizations, surgeries, and ER visits in previous 12 months . Vitals . Screenings to include cognitive, depression, and falls . Referrals and  appointments  In addition,  I have reviewed and discussed with patient certain preventive protocols, quality metrics, and best practice recommendations. A written personalized care plan for preventive services as well as general preventive health recommendations were provided to patient.    I have reviewed the above information and agree with above.   Deborra Medina, MD   Varney Biles, LPN  [id number]

## 2017-03-07 ENCOUNTER — Other Ambulatory Visit: Payer: Self-pay | Admitting: Internal Medicine

## 2017-04-15 ENCOUNTER — Encounter: Payer: Self-pay | Admitting: Internal Medicine

## 2017-04-15 ENCOUNTER — Ambulatory Visit (INDEPENDENT_AMBULATORY_CARE_PROVIDER_SITE_OTHER): Payer: Medicare Other | Admitting: Internal Medicine

## 2017-04-15 VITALS — BP 162/84 | HR 77 | Temp 98.3°F | Wt 134.0 lb

## 2017-04-15 DIAGNOSIS — R5383 Other fatigue: Secondary | ICD-10-CM | POA: Diagnosis not present

## 2017-04-15 DIAGNOSIS — Z0001 Encounter for general adult medical examination with abnormal findings: Secondary | ICD-10-CM | POA: Diagnosis not present

## 2017-04-15 DIAGNOSIS — T50905A Adverse effect of unspecified drugs, medicaments and biological substances, initial encounter: Secondary | ICD-10-CM | POA: Diagnosis not present

## 2017-04-15 DIAGNOSIS — K5900 Constipation, unspecified: Secondary | ICD-10-CM | POA: Diagnosis not present

## 2017-04-15 DIAGNOSIS — D519 Vitamin B12 deficiency anemia, unspecified: Secondary | ICD-10-CM | POA: Diagnosis not present

## 2017-04-15 DIAGNOSIS — E7849 Other hyperlipidemia: Secondary | ICD-10-CM | POA: Diagnosis not present

## 2017-04-15 DIAGNOSIS — L578 Other skin changes due to chronic exposure to nonionizing radiation: Secondary | ICD-10-CM

## 2017-04-15 DIAGNOSIS — I10 Essential (primary) hypertension: Secondary | ICD-10-CM | POA: Diagnosis not present

## 2017-04-15 DIAGNOSIS — K219 Gastro-esophageal reflux disease without esophagitis: Secondary | ICD-10-CM | POA: Diagnosis not present

## 2017-04-15 DIAGNOSIS — Z0000 Encounter for general adult medical examination without abnormal findings: Secondary | ICD-10-CM

## 2017-04-15 DIAGNOSIS — R8789 Other abnormal findings in specimens from female genital organs: Secondary | ICD-10-CM | POA: Diagnosis not present

## 2017-04-15 DIAGNOSIS — K760 Fatty (change of) liver, not elsewhere classified: Secondary | ICD-10-CM

## 2017-04-15 DIAGNOSIS — R7303 Prediabetes: Secondary | ICD-10-CM

## 2017-04-15 DIAGNOSIS — R87618 Other abnormal cytological findings on specimens from cervix uteri: Secondary | ICD-10-CM

## 2017-04-15 LAB — MICROALBUMIN / CREATININE URINE RATIO
Creatinine,U: 47.3 mg/dL
Microalb Creat Ratio: 1.5 mg/g (ref 0.0–30.0)

## 2017-04-15 LAB — LIPID PANEL
Cholesterol: 189 mg/dL (ref 0–200)
HDL: 62.4 mg/dL (ref >39.00)
LDL Cholesterol: 96 mg/dL (ref 0–99)
NONHDL: 126.21
Total CHOL/HDL Ratio: 3
Triglycerides: 149 mg/dL (ref 0.0–149.0)
VLDL: 29.8 mg/dL (ref 0.0–40.0)

## 2017-04-15 LAB — VITAMIN D 25 HYDROXY (VIT D DEFICIENCY, FRACTURES): VITD: 31.23 ng/mL (ref 30.00–100.00)

## 2017-04-15 LAB — VITAMIN B12: Vitamin B-12: 221 pg/mL (ref 211–911)

## 2017-04-15 LAB — COMPREHENSIVE METABOLIC PANEL
ALT: 15 U/L (ref 0–35)
AST: 16 U/L (ref 0–37)
Albumin: 4.6 g/dL (ref 3.5–5.2)
Alkaline Phosphatase: 87 U/L (ref 39–117)
BILIRUBIN TOTAL: 0.5 mg/dL (ref 0.2–1.2)
BUN: 14 mg/dL (ref 6–23)
CO2: 30 meq/L (ref 19–32)
CREATININE: 0.82 mg/dL (ref 0.40–1.20)
Calcium: 9.6 mg/dL (ref 8.4–10.5)
Chloride: 103 mEq/L (ref 96–112)
GFR: 73.11 mL/min (ref >60.00)
GLUCOSE: 108 mg/dL — AB (ref 70–99)
Potassium: 3.8 mEq/L (ref 3.5–5.1)
SODIUM: 142 meq/L (ref 135–145)
Total Protein: 7.7 g/dL (ref 6.0–8.3)

## 2017-04-15 LAB — CBC WITH DIFFERENTIAL/PLATELET
BASOS ABS: 0 10*3/uL (ref 0.0–0.1)
Basophils Relative: 0.5 % (ref 0.0–3.0)
EOS ABS: 0.1 10*3/uL (ref 0.0–0.7)
Eosinophils Relative: 1 % (ref 0.0–5.0)
HCT: 37.4 % (ref 36.0–46.0)
Hemoglobin: 12.7 g/dL (ref 12.0–15.0)
LYMPHS ABS: 1.7 10*3/uL (ref 0.7–4.0)
Lymphocytes Relative: 31.3 % (ref 12.0–46.0)
MCHC: 33.9 g/dL (ref 30.0–36.0)
MCV: 85.1 fl (ref 78.0–100.0)
Monocytes Absolute: 0.4 10*3/uL (ref 0.1–1.0)
Monocytes Relative: 7.8 % (ref 3.0–12.0)
NEUTROS ABS: 3.2 10*3/uL (ref 1.4–7.7)
NEUTROS PCT: 59.4 % (ref 43.0–77.0)
PLATELETS: 241 10*3/uL (ref 150.0–400.0)
RBC: 4.39 Mil/uL (ref 3.87–5.11)
RDW: 12.7 % (ref 11.5–15.5)
WBC: 5.4 10*3/uL (ref 4.0–10.5)

## 2017-04-15 LAB — HEMOGLOBIN A1C: HEMOGLOBIN A1C: 5.9 % (ref 4.6–6.5)

## 2017-04-15 MED ORDER — HYDROCHLOROTHIAZIDE 25 MG PO TABS: 25.0000 mg | ORAL_TABLET | Freq: Every day | ORAL | 0 refills | Status: DC

## 2017-04-15 MED ORDER — ZOSTER VAC RECOMB ADJUVANTED 50 MCG/0.5ML IM SUSR: 0.5000 mL | Freq: Once | INTRAMUSCULAR | 1 refills | Status: AC

## 2017-04-15 NOTE — Progress Notes (Addendum)
Patient ID: Sharon Webster, female    DOB: 03-26-1945  Age: 72 y.o. MRN: [id number]  The patient is here for annual  preventive examination and management of other chronic and acute problems.   Breast exam and mammogram done Nov 2018 and annually by dr Sharon Webster due to history of atypical ductal hyperplasia Colonic polyps last scope in Jan 2017:  tubular adenoma   Next scope due in 2022  PAP SMEAR done by  dr Sharon Webster due to HPV positive history,.  was done in  2017 and high risk negative,  No more paps needed per Sharon Webster   Osteopenia    The risk factors are reflected in the social history.  The roster of all physicians providing medical care to patient - is listed in the Snapshot section of the chart.  Activities of daily living:  The patient is 100% independent in all ADLs: dressing, toileting, feeding as well as independent mobility  Home safety : The patient has smoke detectors in the home. They wear seatbelts.  There are no firearms at home. There is no violence in the home.   There is no risks for hepatitis, STDs or HIV. There is no   history of blood transfusion. They have no travel history to infectious disease endemic areas of the world.  The patient has seen their dentist in the last six month. They have seen their eye doctor in the last year.   They have deferred audiologic testing in the last year.  They do not  have excessive sun exposure. Discussed the need for sun protection: hats, long sleeves and use of sunscreen if there is significant sun exposure.   Diet: the importance of a healthy diet is discussed. They do have a healthy diet.  The benefits of regular aerobic exercise were discussed. She walks 4 times per week ,  20 minutes.   Depression screen: there are no signs or vegative symptoms of depression- irritability, change in appetite, anhedonia, sadness/tearfullness.  Cognitive assessment: the patient manages all their financial and personal affairs and is actively  engaged. They could relate day,date,year and events; recalled 2/3 objects at 3 minutes; performed clock-face test normally.  The following portions of the patient's history were reviewed and updated as appropriate: allergies, current medications, past family history, past medical history,  past surgical history, past social history  and problem list.  Visual acuity was not assessed per patient preference since she has regular follow up with her ophthalmologist. Hearing and body mass index were assessed and reviewed.   During the course of the visit the patient was educated and counseled about appropriate screening and preventive services including : fall prevention , diabetes screening, nutrition counseling, colorectal cancer screening, and recommended immunizations.    CC: The primary encounter diagnosis was Encounter for preventive health examination. Diagnoses of Sun-damaged skin, Gastroesophageal reflux disease without esophagitis, Familial hyperlipidemia, high LDL, Prediabetes, Fatigue, unspecified type, Essential hypertension, Pap smear abnormality of cervix/human papillomavirus (HPV) positive, Hepatic steatosis, Constipation, unspecified constipation type, and Drug-induced vitamin B12 deficiency anemia were also pertinent to this visit.  New onset constipation, occurring for the last  2 weeks.  Bowels used to be loose on a daily basis.  Had one   episode of rectal bleeding after straining .  Has a history of hemorrhoids,  Using preparation h   Widowed 11 years,  Husband died of Colon ca .  Current  boyfriend of 5 years  Has lots of health issues and his children do not help.  Has not smoked since 2003.  Treated for COPD with bariatric pressure!!  By Sharon Webster  A decade ago and has not smoked since .  Denies  chest pain, chest pain and claudication     History Sharon Webster has a past medical history of Bowel trouble, Breast screening, unspecified, Colon polyp (2011), COPD (chronic obstructive  pulmonary disease) ([place]), Diverticulosis, Duodenitis, GERD (gastroesophageal reflux disease), Glaucoma, Lump or mass in breast, Personal history of tobacco use, presenting hazards to health, and Special screening for malignant neoplasms, colon.   She has a past surgical history that includes Colonoscopy (08/20/2009); Breast biopsy (Right, 12/14/2011); and Colonoscopy with propofol (N/A, 04/02/2015).   Her family history includes Aneurysm in her brother; COPD in her mother; Cancer in her father; Heart disease in her mother and sister; Hypertension in her mother; Stroke in her sister.She reports that she quit smoking about 15 years ago. Her smoking use included cigarettes. She has a 20.00 pack-year smoking history. she has never used smokeless tobacco. She reports that she drinks about 1.8 oz of alcohol per week. She reports that she does not use drugs.  Outpatient Medications Prior to Visit  Medication Sig Dispense Refill  . bismuth subsalicylate (PEPTO BISMOL) 262 MG chewable tablet Chew 524 mg by mouth as needed.    . dicyclomine (BENTYL) 20 MG tablet TAKE 1 TABLET BY MOUTH THREE TIMES DAILY BEFORE MEALS 90 tablet 0  . pantoprazole (PROTONIX) 40 MG tablet TAKE 1 TABLET(40 MG) BY MOUTH DAILY 90 tablet 0  . pravastatin (PRAVACHOL) 20 MG tablet TAKE 1 TABLET(20 MG) BY MOUTH DAILY 90 tablet 0  . Simethicone (GAS-X PO) Take by mouth.     No facility-administered medications prior to visit.     Review of Systems  Patient denies headache, fevers, malaise, unintentional weight loss, skin rash, eye pain, sinus congestion and sinus pain, sore throat, dysphagia,  hemoptysis , cough, dyspnea, wheezing, chest pain, palpitations, orthopnea, edema, abdominal pain, nausea, melena,  flank pain, dysuria, hematuria, urinary  Frequency, nocturia, numbness, tingling, seizures,  Focal weakness, Loss of consciousness,  Tremor, insomnia, depression, anxiety, and suicidal ideation.     Objective:  BP (!) 162/84    Pulse 77   Temp 98.3 F (36.8 C) (Oral)   Wt 134 lb (60.8 kg)   SpO2 98%   BMI 26.17 kg/m   Physical Exam   General appearance: alert, cooperative and appears stated age Ears: normal TM's and external ear canals both ears Throat: lips, mucosa, and tongue normal; teeth and gums normal Neck: no adenopathy, no carotid bruit, supple, symmetrical, trachea midline and thyroid not enlarged, symmetric, no tenderness/mass/nodules Back: symmetric, no curvature. ROM normal. No CVA tenderness. Lungs: clear to auscultation bilaterally Heart: regular rate and rhythm, S1, S2 normal, no murmur, click, rub or gallop Abdomen: soft, non-tender; bowel sounds normal; no masses,  no organomegaly Pulses: 2+ and symmetric Skin: Skin color, texture, turgor normal. No rashes or lesions Lymph nodes: Cervical, supraclavicular, and axillary nodes normal.    Assessment & Plan:   Problem List Items Addressed This Visit    GERD (gastroesophageal reflux disease)   Relevant Orders   VITAMIN D 25 Hydroxy (Vit-D Deficiency, Fractures) (Completed)   B12 (Completed)   Constipation    New onset x 2 weeks,  With rectal bleeding episode x 1 during straining event. Encouraged to  increase the fiber in her diet to 25 g daily  Also recommended adding  miralax, metamucil, fibercon, or citrucel daily to supplement her fiber. She  was advised that she  can also combine them daily with colace, and to make 4 16 ounce servings of water a daily goal .  If no resolution with daily BFL,  Will refer to [facility] for colonoscopy      Drug-induced vitamin B12 deficiency anemia    likely secondary to prolonged use of PPI.  Will supplment,  Check intrinsic factor with next visit.   Lab Results  Component Value Date   [id number] 04/15/2017         Encounter for preventive health examination - Primary    Annual comprehensive preventive exam was done as well as an evaluation and management of chronic conditions .  During the course  of the visit the patient was educated and counseled about appropriate screening and preventive services including :  diabetes screening, lipid analysis with projected  10 year  risk for CAD , nutrition counseling, breast, cervical and colorectal cancer screening, and recommended immunizations.  Printed recommendations for health maintenance screenings was given      Essential hypertension    Starting hctz .  Rtc one week for bp check and bmet  Lab Results  Component Value Date   NA 142 04/15/2017   K 3.8 04/15/2017   CL 103 04/15/2017   CO2 30 04/15/2017   Lab Results  Component Value Date   CREATININE 0.82 04/15/2017         Relevant Medications   hydrochlorothiazide (HYDRODIURIL) 25 MG tablet   Other Relevant Orders   Microalbumin / creatinine urine ratio (Completed)   Familial hyperlipidemia, high LDL    ;  She is taking pravastatin .and LDL is 96,  LFTs are normal     Lab Results  Component Value Date   CHOL 189 04/15/2017   HDL 62.40 04/15/2017   LDLCALC 96 04/15/2017   LDLDIRECT 122.0 01/07/2016   TRIG 149.0 04/15/2017   CHOLHDL 3 04/15/2017            Relevant Medications   hydrochlorothiazide (HYDRODIURIL) 25 MG tablet   Other Relevant Orders   Lipid panel (Completed)   Hepatic steatosis    Presumed by ultrasound changes and serologies negative for autoimmune causes of hepatitis.  Current liver enzymes are normal and all modifiable risk factors including obesity, diabetes and hyperlipidemia have been addressed .  Hepatitis A/B vaccines have been given  Lab Results  Component Value Date   ALT 15 04/15/2017   AST 16 04/15/2017   ALKPHOS 87 04/15/2017   BILITOT 0.5 04/15/2017         Pap smear abnormality of cervix/human papillomavirus (HPV) positive    GYN evaluation has been done . HPV risk profile was negative for high risk..  No additional PAP smear screening is necessary per GYN      Prediabetes    Her random glucose is again elevated but not  diagnostic of diabetes .  I recommend he follow a low glycemic index diet and particpate regularly in an aerobic  exercise activity.  We should check an A1c in 6 months.   Lab Results  Component Value Date   HGBA1C 5.9 04/15/2017         Relevant Orders   Comprehensive metabolic panel (Completed)   Hemoglobin A1c (Completed)    Other Visit Diagnoses    Sun-damaged skin       Relevant Orders   Ambulatory referral to Dermatology   Hemoglobin A1c (Completed)   Fatigue, unspecified type  Relevant Orders   CBC with Differential/Platelet (Completed)      I am having Sharon Webster start on Zoster Vaccine Adjuvanted and hydrochlorothiazide. I am also having her maintain her Simethicone (GAS-X PO), bismuth subsalicylate, pantoprazole, dicyclomine, and pravastatin.  Meds ordered this encounter  Medications  . Zoster Vaccine Adjuvanted [facility]) injection    Sig: Inject 0.5 mLs into the muscle once for 1 dose.    Dispense:  1 each    Refill:  1  . hydrochlorothiazide (HYDRODIURIL) 25 MG tablet    Sig: Take 1 tablet (25 mg total) by mouth daily.    Dispense:  90 tablet    Refill:  0    There are no discontinued medications.  Follow-up: Return in about 2 weeks (around 04/29/2017), or RN visit for bp check, for 6 monhs Ermal Haberer hypertension follow up .   Crecencio Mc, MD

## 2017-04-15 NOTE — Patient Instructions (Addendum)
1) your blood pressure has been elevated the last 3 times you have been here .   the currently recommended acceptable standard is 120/70, so I am recommending that you start a mild diuretic called hydrochlorthiazide (HCTZ for short) to lower it.  I will send it  to your pharmacy for a 30 day trial.  Please make nurse visit in 1-2 weeks for a repeat bp check.  You can bring a  home monitor with you  for comparison so that we can use your home readings to guide therapy    2) You have osteopenia.   This increases your risk for osteoporosis and fragile bones.  Osteopenia is treated with calcium, Vitamin D and weight bearing exercise.  I recommend getting the majority of your calcium and Vitamin D  through diet rather than supplements given the recent association of calcium supplements with increased coronary artery calcium scores  Unsweetened almond/coconut milk is a great low calorie low carb way to increase your dietary calcium and vitamin D.  Try the blue [employer],  [place],  [place] and cod liver oil  Are great dietary sources of Vitamin D  Next DEXA will be due in 2023    3) You do not need any  more PAP smears!  Per  Dr Marcelline Mates   4) Next colonoscopy is due in 2022,  Unless your current constipation issues do not resolve with the following changes:   I recommend you  add  a daily bulk forming laxative (Citrucel, Metamucil, Benefiber, Miralax , or Fibercon) 30 minutes prior to her largest meal to increase satiety.    Drink 60 ounces of water daily   Glycerin suppositories are ok to use on a daily basis if needed o hep pass your stools.   Gas is a result of constipation .  Also results from a healthy diet. Try taking beano with any meal containing beans or broccoli/brussels sprouts/cauliflower

## 2017-04-17 DIAGNOSIS — K5900 Constipation, unspecified: Secondary | ICD-10-CM | POA: Insufficient documentation

## 2017-04-17 DIAGNOSIS — I10 Essential (primary) hypertension: Secondary | ICD-10-CM | POA: Insufficient documentation

## 2017-04-17 DIAGNOSIS — D519 Vitamin B12 deficiency anemia, unspecified: Secondary | ICD-10-CM | POA: Insufficient documentation

## 2017-04-17 DIAGNOSIS — T50905A Adverse effect of unspecified drugs, medicaments and biological substances, initial encounter: Secondary | ICD-10-CM

## 2017-04-17 NOTE — Assessment & Plan Note (Signed)
Presumed by ultrasound changes and serologies negative for autoimmune causes of hepatitis.  Current liver enzymes are normal and all modifiable risk factors including obesity, diabetes and hyperlipidemia have been addressed .  Hepatitis A/B vaccines have been given  Lab Results  Component Value Date   ALT 15 04/15/2017   AST 16 04/15/2017   ALKPHOS 87 04/15/2017   BILITOT 0.5 04/15/2017

## 2017-04-17 NOTE — Assessment & Plan Note (Signed)
likely secondary to prolonged use of PPI.  Will supplment,  Check intrinsic factor with next visit.   Lab Results  Component Value Date   [id number] 04/15/2017

## 2017-04-17 NOTE — Assessment & Plan Note (Addendum)
GYN evaluation has been done . HPV risk profile was negative for high risk..  No additional PAP smear screening is necessary per GYN 

## 2017-04-17 NOTE — Assessment & Plan Note (Signed)
Starting hctz .  Rtc one week for bp check and bmet  Lab Results  Component Value Date   NA 142 04/15/2017   K 3.8 04/15/2017   CL 103 04/15/2017   CO2 30 04/15/2017   Lab Results  Component Value Date   CREATININE 0.82 04/15/2017

## 2017-04-17 NOTE — Assessment & Plan Note (Signed)
;    She is taking pravastatin .and LDL is 96,  LFTs are normal     Lab Results  Component Value Date   CHOL 189 04/15/2017   HDL 62.40 04/15/2017   LDLCALC 96 04/15/2017   LDLDIRECT 122.0 01/07/2016   TRIG 149.0 04/15/2017   CHOLHDL 3 04/15/2017

## 2017-04-17 NOTE — Assessment & Plan Note (Signed)
Her random glucose is again elevated but not diagnostic of diabetes .  I recommend he follow a low glycemic index diet and particpate regularly in an aerobic  exercise activity.  We should check an A1c in 6 months.   Lab Results  Component Value Date   HGBA1C 5.9 04/15/2017

## 2017-04-17 NOTE — Assessment & Plan Note (Addendum)
New onset x 2 weeks,  With rectal bleeding episode x 1 during straining event. Encouraged to  increase the fiber in her diet to 25 g daily  Also recommended adding  miralax, metamucil, fibercon, or citrucel daily to supplement her fiber. She was advised that she  can also combine them daily with colace, and to make 4 16 ounce servings of water a daily goal .  If no resolution with daily BFL,  Will refer to [facility] for colonoscopy

## 2017-04-17 NOTE — Assessment & Plan Note (Signed)
Annual comprehensive preventive exam was done as well as an evaluation and management of chronic conditions .  During the course of the visit the patient was educated and counseled about appropriate screening and preventive services including :  diabetes screening, lipid analysis with projected  10 year  risk for CAD , nutrition counseling, breast, cervical and colorectal cancer screening, and recommended immunizations.  Printed recommendations for health maintenance screenings was given 

## 2017-05-03 ENCOUNTER — Ambulatory Visit: Payer: Self-pay

## 2017-05-03 ENCOUNTER — Ambulatory Visit (INDEPENDENT_AMBULATORY_CARE_PROVIDER_SITE_OTHER): Payer: Medicare Other | Admitting: *Deleted

## 2017-05-03 VITALS — BP 134/80 | HR 75

## 2017-05-03 DIAGNOSIS — I10 Essential (primary) hypertension: Secondary | ICD-10-CM

## 2017-05-03 DIAGNOSIS — E538 Deficiency of other specified B group vitamins: Secondary | ICD-10-CM | POA: Diagnosis not present

## 2017-05-03 MED ORDER — CYANOCOBALAMIN 1000 MCG/ML IJ SOLN
1000.0000 ug | Freq: Once | INTRAMUSCULAR | Status: AC
  Administered 2017-05-03: 1000 ug via INTRAMUSCULAR

## 2017-05-03 NOTE — Progress Notes (Addendum)
Patient returned for BP check after starting HCTZ 25 mg on 04/16/17 , patient advised nurse she was only taking 12.5 mg of HCTZ due to the 25 mg caused her to be dizzy.  Patient BP taken in left arm 134/80 pulse 75.   Patient also has order for  b 12 injection from lab note 04/17/17.  Patient presented for B 12 injection to left deltoid, patient voiced no concerns nor showed any signs of distress during injection  Reviewed.  Blood pressure overall looks good.  Continue medication as she is dong.  Can show to Dr Derrel Nip for [facility].    Dr Nicki Reaper

## 2017-05-03 NOTE — Progress Notes (Signed)
Reviewed.  Can forward also to Dr Derrel Nip for [facility] regarding blood pressure.

## 2017-05-06 ENCOUNTER — Other Ambulatory Visit: Payer: Self-pay | Admitting: Internal Medicine

## 2017-05-10 ENCOUNTER — Ambulatory Visit (INDEPENDENT_AMBULATORY_CARE_PROVIDER_SITE_OTHER): Payer: Medicare Other | Admitting: *Deleted

## 2017-05-10 DIAGNOSIS — E538 Deficiency of other specified B group vitamins: Secondary | ICD-10-CM | POA: Diagnosis not present

## 2017-05-10 MED ORDER — CYANOCOBALAMIN 1000 MCG/ML IJ SOLN
1000.0000 ug | Freq: Once | INTRAMUSCULAR | Status: AC
  Administered 2017-05-10: 1000 ug via INTRAMUSCULAR

## 2017-05-10 NOTE — Progress Notes (Signed)
Patient presented for B 12 injection to right deltoid, patient voiced no concerns nor showed any signs of distress during injection. 

## 2017-05-11 NOTE — Progress Notes (Signed)
  I have reviewed the above information and agree with above.   Baneza Bartoszek, MD 

## 2017-05-13 ENCOUNTER — Other Ambulatory Visit: Payer: Self-pay | Admitting: Internal Medicine

## 2017-05-17 ENCOUNTER — Ambulatory Visit: Payer: Self-pay

## 2017-05-19 ENCOUNTER — Ambulatory Visit (INDEPENDENT_AMBULATORY_CARE_PROVIDER_SITE_OTHER): Payer: Medicare Other

## 2017-05-19 DIAGNOSIS — D519 Vitamin B12 deficiency anemia, unspecified: Secondary | ICD-10-CM

## 2017-05-19 DIAGNOSIS — T50905A Adverse effect of unspecified drugs, medicaments and biological substances, initial encounter: Principal | ICD-10-CM

## 2017-05-19 MED ORDER — CYANOCOBALAMIN 1000 MCG/ML IJ SOLN
1000.0000 ug | Freq: Once | INTRAMUSCULAR | Status: AC
  Administered 2017-05-19: 1000 ug via INTRAMUSCULAR

## 2017-05-19 NOTE — Progress Notes (Addendum)
Patient comes in today for a Vitamin B 12 injection. Administered in Left Deltoid IM. Patient tolerated well.  Reviewed.  Dr Nicki Reaper

## 2017-06-02 ENCOUNTER — Other Ambulatory Visit: Payer: Self-pay | Admitting: Internal Medicine

## 2017-07-09 ENCOUNTER — Other Ambulatory Visit: Payer: Self-pay | Admitting: Internal Medicine

## 2017-08-09 ENCOUNTER — Other Ambulatory Visit: Payer: Self-pay | Admitting: Internal Medicine

## 2017-08-29 ENCOUNTER — Other Ambulatory Visit: Payer: Self-pay | Admitting: Internal Medicine

## 2017-10-04 ENCOUNTER — Other Ambulatory Visit: Payer: Self-pay | Admitting: Internal Medicine

## 2017-10-13 ENCOUNTER — Encounter: Payer: Self-pay | Admitting: Internal Medicine

## 2017-10-13 ENCOUNTER — Ambulatory Visit: Payer: Medicare Other | Admitting: Internal Medicine

## 2017-10-13 VITALS — BP 120/72 | HR 67 | Temp 98.1°F | Resp 16 | Ht 60.0 in | Wt 132.6 lb

## 2017-10-13 DIAGNOSIS — K2950 Unspecified chronic gastritis without bleeding: Secondary | ICD-10-CM

## 2017-10-13 DIAGNOSIS — E538 Deficiency of other specified B group vitamins: Secondary | ICD-10-CM

## 2017-10-13 DIAGNOSIS — I10 Essential (primary) hypertension: Secondary | ICD-10-CM

## 2017-10-13 DIAGNOSIS — R7303 Prediabetes: Secondary | ICD-10-CM

## 2017-10-13 DIAGNOSIS — T50905A Adverse effect of unspecified drugs, medicaments and biological substances, initial encounter: Secondary | ICD-10-CM

## 2017-10-13 DIAGNOSIS — E7849 Other hyperlipidemia: Secondary | ICD-10-CM | POA: Diagnosis not present

## 2017-10-13 DIAGNOSIS — D519 Vitamin B12 deficiency anemia, unspecified: Secondary | ICD-10-CM

## 2017-10-13 DIAGNOSIS — M8588 Other specified disorders of bone density and structure, other site: Secondary | ICD-10-CM

## 2017-10-13 DIAGNOSIS — K2970 Gastritis, unspecified, without bleeding: Secondary | ICD-10-CM | POA: Insufficient documentation

## 2017-10-13 LAB — COMPREHENSIVE METABOLIC PANEL
ALBUMIN: 4.6 g/dL (ref 3.5–5.2)
ALK PHOS: 80 U/L (ref 39–117)
ALT: 12 U/L (ref 0–35)
AST: 14 U/L (ref 0–37)
BUN: 16 mg/dL (ref 6–23)
CHLORIDE: 101 meq/L (ref 96–112)
CO2: 31 meq/L (ref 19–32)
CREATININE: 0.89 mg/dL (ref 0.40–1.20)
Calcium: 9.9 mg/dL (ref 8.4–10.5)
GFR: 66.42 mL/min (ref >60.00)
GLUCOSE: 105 mg/dL — AB (ref 70–99)
Potassium: 3.6 mEq/L (ref 3.5–5.1)
SODIUM: 140 meq/L (ref 135–145)
Total Bilirubin: 0.6 mg/dL (ref 0.2–1.2)
Total Protein: 7.5 g/dL (ref 6.0–8.3)

## 2017-10-13 LAB — LIPID PANEL
CHOLESTEROL: 182 mg/dL (ref 0–200)
HDL: 51.7 mg/dL (ref >39.00)
LDL Cholesterol: 96 mg/dL (ref 0–99)
NonHDL: 130.09
Total CHOL/HDL Ratio: 4
Triglycerides: 170 mg/dL — ABNORMAL HIGH (ref 0.0–149.0)
VLDL: 34 mg/dL (ref 0.0–40.0)

## 2017-10-13 LAB — VITAMIN B12: VITAMIN B 12: 555 pg/mL (ref 211–911)

## 2017-10-13 LAB — HEMOGLOBIN A1C: HEMOGLOBIN A1C: 6.1 % (ref 4.6–6.5)

## 2017-10-13 MED ORDER — HYDROCHLOROTHIAZIDE 12.5 MG PO TABS: 12.5000 mg | ORAL_TABLET | Freq: Every day | ORAL | 0 refills | Status: DC

## 2017-10-13 NOTE — Progress Notes (Signed)
Subjective:  Patient ID: Sharon Webster, female    DOB: 20-Oct-1945  Age: 72 y.o. MRN: [id number]  CC: The primary encounter diagnosis was Prediabetes. Diagnoses of Essential hypertension, B12 deficiency, Chronic gastritis without bleeding, unspecified gastritis type, Familial hyperlipidemia, high LDL, Drug-induced vitamin B12 deficiency anemia, and Osteopenia of lumbar spine were also pertinent to this visit.  HPI Sharon Webster presents for 6 month follow up on hypertension, hyperlipidemia and prediabetes.  patient checks blood pressure twice weekly at home.  Readings have been for the most part < 140/80 at rest . Patient is following a reduced salt diet most days and is taking medications as prescribed,except that that she been Cutting hctz in half.  Whole tablet mde her dizzy    Had a home FOBT done during a home visit by  [facility] RN that was reportedly negative.  Physically active with yardwork . Boyfriend requires a lot of assistance.  She fears that he is Slowly dying  Dur to his multiple health issues and lack of initiative to help hiself.   Taking oral b12 since March for low b12   Outpatient Medications Prior to Visit  Medication Sig Dispense Refill  . bismuth subsalicylate (PEPTO BISMOL) 262 MG chewable tablet Chew 524 mg by mouth as needed.    . dicyclomine (BENTYL) 20 MG tablet TAKE 1 TABLET BY MOUTH THREE TIMES DAILY BEFORE MEALS 270 tablet 0  . pantoprazole (PROTONIX) 40 MG tablet TAKE 1 TABLET(40 MG) BY MOUTH DAILY 90 tablet 1  . pravastatin (PRAVACHOL) 20 MG tablet TAKE 1 TABLET(20 MG) BY MOUTH DAILY 90 tablet 0  . hydrochlorothiazide (HYDRODIURIL) 25 MG tablet TAKE 1 TABLET(25 MG) BY MOUTH DAILY 90 tablet 1  . Simethicone (GAS-X PO) Take by mouth.     No facility-administered medications prior to visit.     Review of Systems;  Patient denies headache, fevers, malaise, unintentional weight loss, skin rash, eye pain, sinus congestion and sinus  pain, sore throat, dysphagia,  hemoptysis , cough, dyspnea, wheezing, chest pain, palpitations, orthopnea, edema, abdominal pain, nausea, melena, diarrhea, constipation, flank pain, dysuria, hematuria, urinary  Frequency, nocturia, numbness, tingling, seizures,  Focal weakness, Loss of consciousness,  Tremor, insomnia, depression, anxiety, and suicidal ideation.      Objective:  BP 120/72 (BP Location: Left Arm, Patient Position: Sitting, Cuff Size: Normal)   Pulse 67   Temp 98.1 F (36.7 C) (Oral)   Resp 16   Ht 5' (1.524 m)   Wt 132 lb 9.6 oz (60.1 kg)   SpO2 96%   BMI 25.90 kg/m   BP Readings from Last 3 Encounters:  10/13/17 120/72  05/03/17 134/80  04/15/17 (!) 162/84    Wt Readings from Last 3 Encounters:  10/13/17 132 lb 9.6 oz (60.1 kg)  04/15/17 134 lb (60.8 kg)  02/17/17 133 lb 12.8 oz (60.7 kg)    General appearance: alert, cooperative and appears stated age Ears: normal TM's and external ear canals both ears Throat: lips, mucosa, and tongue normal; teeth and gums normal Neck: no adenopathy, no carotid bruit, supple, symmetrical, trachea midline and thyroid not enlarged, symmetric, no tenderness/mass/nodules Back: symmetric, no curvature. ROM normal. No CVA tenderness. Lungs: clear to auscultation bilaterally Heart: regular rate and rhythm, S1, S2 normal, no murmur, click, rub or gallop Abdomen: soft, non-tender; bowel sounds normal; no masses,  no organomegaly Pulses: 2+ and symmetric Skin: Skin color, texture, turgor normal. No rashes or lesions Lymph nodes: Cervical, supraclavicular, and axillary nodes normal.  Lab Results  Component Value Date   HGBA1C 6.1 10/13/2017   HGBA1C 5.9 04/15/2017   HGBA1C 5.8 01/07/2016    Lab Results  Component Value Date   CREATININE 0.89 10/13/2017   CREATININE 0.82 04/15/2017   CREATININE 0.83 12/02/2015    Lab Results  Component Value Date   WBC 5.4 04/15/2017   HGB 12.7 04/15/2017   HCT 37.4 04/15/2017   PLT  241.0 04/15/2017   GLUCOSE 105 (H) 10/13/2017   CHOL 182 10/13/2017   TRIG 170.0 (H) 10/13/2017   HDL 51.70 10/13/2017   LDLDIRECT 122.0 01/07/2016   LDLCALC 96 10/13/2017   ALT 12 10/13/2017   AST 14 10/13/2017   NA 140 10/13/2017   K 3.6 10/13/2017   CL 101 10/13/2017   CREATININE 0.89 10/13/2017   BUN 16 10/13/2017   CO2 31 10/13/2017   TSH 1.52 01/07/2016   HGBA1C 6.1 10/13/2017   MICROALBUR <0.7 04/15/2017    Dexascan  Result Date: 04/27/2016 EXAM: DUAL X-RAY ABSORPTIOMETRY (DXA) FOR BONE MINERAL DENSITY IMPRESSION: Dear Dr. Derrel Webster, Your patient Sharon Webster completed a BMD test on 04/27/2016 using the [place] (analysis version: 14.10) manufactured by [employer]. The following summarizes the results of our evaluation. PATIENT BIOGRAPHICAL: Name: Sharon, Webster Patient ID: [id number] Birth Date: 18-Sep-1945 Height: 61.0 in. Gender: Female Exam Date: 04/27/2016 Weight: 133.4 lbs. Indications: Caucasian, Family History of Fracture, Family Hx of Osteoporosis, History of Fracture (Adult), Parent Hip Fracture, Postmenopausal, Family Hist. (Parent hip fracture) Fractures: Nasal Bones Treatments: pantoprozole, pravastatin ASSESSMENT: The BMD measured at AP Spine L1-L4 is 1.030 g/cm2 with a T-score of -1.3. This patient is considered OSTEOPENIC according to [facility]) criteria. Site Region Measured Measured WHO Young Adult BMD Date       Age      Classification T-score AP Spine L1-L4 04/27/2016 69.7 Osteopenia -1.3 1.030 g/cm2 DualFemur Neck Left 04/27/2016 69.7 Normal -0.6 0.960 g/cm2 World Health Organization [facility]) criteria for post-menopausal, Caucasian Women: Normal:       T-score at or above -1 SD Osteopenia:   T-score between -1 and -2.5 SD Osteoporosis: T-score at or below -2.5 SD RECOMMENDATIONS: [place] recommends that FDA-approved medical therapies be considered in postmenopausal women and men age 75 or older with a:  1. Hip or vertebral (clinical or morphometric) fracture. 2. T-score of < -2.5 at the spine or hip. 3. Ten-year fracture probability by FRAX of 3% or greater for hip fracture or 20% or greater for major osteoporotic fracture. All treatment decisions require clinical judgment and consideration of individual patient factors, including patient preferences, co-morbidities, previous drug use, risk factors not captured in the FRAX model (e.g. falls, vitamin D deficiency, increased bone turnover, interval significant decline in bone density) and possible under - or over-estimation of fracture risk by FRAX. All patients should ensure an adequate intake of dietary calcium (1200 mg/d) and vitamin D (800 IU daily) unless contraindicated. FOLLOW-UP: People with diagnosed cases of osteoporosis or at high risk for fracture should have regular bone mineral density tests. For patients eligible for Medicare, routine testing is allowed once every 2 years. The testing frequency can be increased to one year for patients who have rapidly progressing disease, those who are receiving or discontinuing medical therapy to restore bone mass, or have additional risk factors. I have reviewed this report, and agree with the above findings. Mark A. Thornton Papas, M.D. [facility] Radiology Dear Dr. Derrel Webster, Your patient Sharon Webster completed a FRAX assessment  on 04/27/2016 using the [place] (analysis version: 14.10) manufactured by [employer]. The following summarizes the results of our evaluation. PATIENT BIOGRAPHICAL: Name: Shannan, Slinker Patient ID: [id number] Birth Date: 01-Jan-1946 Height:    61.0 in. Gender:     Female    [age]       Weight:    133.4 lbs. Ethnicity:  White                            Exam Date: 04/27/2016 FRAX* RESULTS:  (version: 3.5) 10-year Probability of Fracture1 Major Osteoporotic Fracture2 Hip Fracture 20.1% 1.9% Population: [place] (Caucasian) Risk Factors: History of Fracture (Adult), Family  Hist. (Parent hip fracture) Based on Femur (Left) Neck BMD 1 -The 10-year probability of fracture may be lower than reported if the patient has received treatment. 2 -Major Osteoporotic Fracture: Clinical Spine, Forearm, Hip or Shoulder *FRAX is a Materials engineer of the [employer] for Metabolic Bone Disease, a [place] (WHO) [facility]. ASSESSMENT: The probability of a major osteoporotic fracture is 20.1% within the next ten years. The probability of a hip fracture is 1.9% within the next ten years. I have reviewed this report and agree with the above findings. Mark A. Thornton Papas, M.D. [facility] Electronically Signed   By: Lavonia Dana M.D.   On: 04/27/2016 12:18    Assessment & Plan:   Problem List Items Addressed This Visit    Familial hyperlipidemia, high LDL    ;  She is taking pravastatin .and LDL is 96,  LFTs are normal     Lab Results  Component Value Date   CHOL 182 10/13/2017   HDL 51.70 10/13/2017   LDLCALC 96 10/13/2017   LDLDIRECT 122.0 01/07/2016   TRIG 170.0 (H) 10/13/2017   CHOLHDL 4 10/13/2017            Relevant Medications   hydrochlorothiazide (HYDRODIURIL) 12.5 MG tablet   Other Relevant Orders   Lipid panel (Completed)   Prediabetes - Primary    Her random glucose is again  not diagnostic of diabetes but her A1c continues to rise. , making her at risk for diabetes .  I recommend she follow a low glycemic index diet and particpate regularly in an aerobic  exercise activity.  We should check an A1c in 6 months.   Lab Results  Component Value Date   HGBA1C 6.1 10/13/2017         Relevant Orders   Hemoglobin A1c (Completed)   Essential hypertension    Well controlled on current regimen of 12.5 mg hctz. Renal function stable, no changes today.  Lab Results  Component Value Date   CREATININE 0.89 10/13/2017   Lab Results  Component Value Date   NA 140 10/13/2017   K 3.6 10/13/2017   CL 101  10/13/2017   CO2 31 10/13/2017         Relevant Medications   hydrochlorothiazide (HYDRODIURIL) 12.5 MG tablet   Other Relevant Orders   Comprehensive metabolic panel (Completed)   Drug-induced vitamin B12 deficiency anemia    Repeat level with oral supplementation is normal. Likely due to GI meds.  Continue oral supplementation    Lab Results  Component Value Date   [id number] 518 10/13/2017        Gastritis    Managed with protonix. erosions and erythema seen on EGD 2011.  Previous attempts to stop resulted in return of symptoms /  Resulting B12 deficiency detected and addressed/       Osteopenia    Bone Density scores received, she has osteopenia,  Mild. Will repeat in 5 years and consider therapy then if there is a significant change. Continue calcium, vitamin d and weight bearing exercise on a regular basis.        Other Visit Diagnoses    B12 deficiency       Relevant Orders   Intrinsic Factor Antibodies   Vitamin B12 (Completed)     A total of 25 minutes of face to face time was spent with patient more than half of which was spent in counselling about the above mentioned conditions  and coordination of care  I have discontinued Marciano Sequin Simethicone (GAS-X PO). I have also changed her hydrochlorothiazide. Additionally, I am having her maintain her bismuth subsalicylate, pantoprazole, pravastatin, and dicyclomine.  Meds ordered this encounter  Medications  . hydrochlorothiazide (HYDRODIURIL) 12.5 MG tablet    Sig: Take 1 tablet (12.5 mg total) by mouth daily.    Dispense:  90 tablet    Refill:  0    Medications Discontinued During This Encounter  Medication Reason  . Simethicone (GAS-X PO) Patient has not taken in last 30 days  . hydrochlorothiazide (HYDRODIURIL) 25 MG tablet     Follow-up: Return in about 6 months (around 04/15/2018) for annual.   Crecencio Mc, MD

## 2017-10-13 NOTE — Patient Instructions (Signed)
I'm sorry you are having a hard time  If you decide you need to start medication, I am happy to prescribe an antidepressant  I am rechecking your b12 level today to see if you need to repeat the injections  Vitamin B12 Deficiency Vitamin B12 deficiency means that your body is not getting enough vitamin B12. Your body needs vitamin B12 for important bodily functions. If you do not have enough vitamin B12 in your body, you can have health problems. Follow these instructions at home:  Take supplements only as told by your doctor. Follow the directions carefully.  Get any shots (injections) as told by your doctor. Do not miss your visits to the doctor.  Eat lots of healthy foods that contain vitamin B12. Ask your doctor if you should work with someone who is trained in how food affects health (dietitian). Foods that contain vitamin B12 include: ? Meat. ? Meat from birds (poultry). ? Fish. ? Eggs. ? Cereal and dairy products that are fortified. This means that vitamin B12 has been added to the food. Check the label on the package to see if the food is fortified.  Do not drink too much (do not abuse) alcohol.  Keep all follow-up visits as told by your doctor. This is important. Contact a doctor if:  Your symptoms come back. Get help right away if:  You have trouble breathing.  You have chest pain.  You get dizzy.  You pass out (lose consciousness). This information is not intended to replace advice given to you by your health care provider. Make sure you discuss any questions you have with your health care provider. Document Released: 02/18/2011 Document Revised: 08/07/2015 Document Reviewed: 07/17/2014 Elsevier Interactive Patient Education  [employer].

## 2017-10-13 NOTE — Assessment & Plan Note (Addendum)
Managed with protonix. erosions and erythema seen on EGD 2011.    Previous attempts to stop resulted in return of symptoms /  Resulting B12 deficiency detected and addressed/

## 2017-10-15 DIAGNOSIS — M8580 Other specified disorders of bone density and structure, unspecified site: Secondary | ICD-10-CM | POA: Insufficient documentation

## 2017-10-15 NOTE — Assessment & Plan Note (Signed)
Bone Density scores received, she has osteopenia,  Mild. Will repeat in 5 years and consider therapy then if there is a significant change. Continue calcium, vitamin d and weight bearing exercise on a regular basis.

## 2017-10-15 NOTE — Assessment & Plan Note (Signed)
Repeat level with oral supplementation is normal. Likely due to GI meds.  Continue oral supplementation    Lab Results  Component Value Date   [id number] 114 10/13/2017

## 2017-10-15 NOTE — Assessment & Plan Note (Signed)
Well controlled on current regimen of 12.5 mg hctz. Renal function stable, no changes today.  Lab Results  Component Value Date   CREATININE 0.89 10/13/2017   Lab Results  Component Value Date   NA 140 10/13/2017   K 3.6 10/13/2017   CL 101 10/13/2017   CO2 31 10/13/2017

## 2017-10-15 NOTE — Assessment & Plan Note (Signed)
;    She is taking pravastatin .and LDL is 96,  LFTs are normal     Lab Results  Component Value Date   CHOL 182 10/13/2017   HDL 51.70 10/13/2017   LDLCALC 96 10/13/2017   LDLDIRECT 122.0 01/07/2016   TRIG 170.0 (H) 10/13/2017   CHOLHDL 4 10/13/2017       

## 2017-10-15 NOTE — Assessment & Plan Note (Signed)
Her random glucose is again  not diagnostic of diabetes but her A1c continues to rise. , making her at risk for diabetes .  I recommend she follow a low glycemic index diet and particpate regularly in an aerobic  exercise activity.  We should check an A1c in 6 months.   Lab Results  Component Value Date   HGBA1C 6.1 10/13/2017

## 2017-10-16 LAB — INTRINSIC FACTOR ANTIBODIES: Intrinsic Factor: NEGATIVE

## 2017-11-03 ENCOUNTER — Other Ambulatory Visit: Payer: Self-pay | Admitting: Internal Medicine

## 2017-11-20 ENCOUNTER — Other Ambulatory Visit: Payer: Self-pay | Admitting: Internal Medicine

## 2018-01-02 ENCOUNTER — Other Ambulatory Visit: Payer: Self-pay | Admitting: Internal Medicine

## 2018-01-09 ENCOUNTER — Other Ambulatory Visit: Payer: Self-pay | Admitting: Internal Medicine

## 2018-01-17 ENCOUNTER — Telehealth: Payer: Self-pay | Admitting: Internal Medicine

## 2018-01-17 DIAGNOSIS — Z1239 Encounter for other screening for malignant neoplasm of breast: Secondary | ICD-10-CM

## 2018-01-17 NOTE — Telephone Encounter (Signed)
Copied from [place] [id number]. Topic: Referral - Request for Referral >> Jan 17, 2018  9:52 AM Reyne Dumas L wrote: Has patient seen PCP for this complaint? no *If NO, is insurance requiring patient see PCP for this issue before PCP can refer them?  no Referral for which specialty: breast imaging Preferred provider/office: [facility] in [place] Reason for referral: mammogram  Pt states she received a letter from [employer] in [place] letting her know it is time for her mammogram, but the letter states that she needs to contact PCP for referral. Pt can be reached at [phone]

## 2018-01-17 NOTE — Telephone Encounter (Signed)
Patient called and said she is leaving home for a while today and would like to leave her cell # [phone]

## 2018-01-18 NOTE — Telephone Encounter (Signed)
Order for screening mammogram has been placed.

## 2018-01-28 ENCOUNTER — Other Ambulatory Visit: Payer: Self-pay | Admitting: Internal Medicine

## 2018-01-30 ENCOUNTER — Telehealth: Payer: Self-pay

## 2018-01-30 NOTE — Telephone Encounter (Signed)
Spoke with pt and informed her that the order for the mammogram has been placed and that she just needs to call [facility] to schedule the appt. Pt gave a verbal understanding.

## 2018-01-30 NOTE — Telephone Encounter (Signed)
Copied from [place] [id number]. Topic: Referral - Request for Referral >> Jan 30, 2018  2:18 PM Alfredia Ferguson R wrote: Has patient seen PCP for this complaint? No Referral for which specialty: Mammogram Preferred provider/office: [facility] / Breast Imaging Dept  Reason for referral: Mammogram

## 2018-02-08 LAB — HM MAMMOGRAPHY

## 2018-02-21 ENCOUNTER — Ambulatory Visit (INDEPENDENT_AMBULATORY_CARE_PROVIDER_SITE_OTHER): Payer: Medicare Other

## 2018-02-21 VITALS — BP 126/74 | HR 83 | Temp 98.7°F | Resp 15 | Ht 61.0 in | Wt 132.4 lb

## 2018-02-21 DIAGNOSIS — Z23 Encounter for immunization: Secondary | ICD-10-CM

## 2018-02-21 DIAGNOSIS — Z0000 Encounter for general adult medical examination without abnormal findings: Secondary | ICD-10-CM

## 2018-02-21 NOTE — Progress Notes (Signed)
Subjective:   Azha Constantin is a 72 y.o. female who presents for Medicare Annual (Subsequent) preventive examination.  Review of Systems:  No ROS.  Medicare Wellness Visit. Additional risk factors are reflected in the social history. Cardiac Risk Factors include: advanced age (>2men, >50 women);hypertension     Objective:     Vitals: BP 126/74 (BP Location: Left Arm, Patient Position: Sitting, Cuff Size: Normal)   Pulse 83   Temp 98.7 F (37.1 C) (Oral)   Resp 15   Ht 5\' 1"  (1.549 m)   Wt 132 lb 6.4 oz (60.1 kg)   SpO2 99%   BMI 25.02 kg/m   Body mass index is 25.02 kg/m.  Advanced Directives 02/21/2018 02/17/2017 02/18/2016 02/18/2015  Does Patient Have a Medical Advance Directive? No No No Yes  Type of Advance Directive - - - Living will;Healthcare Power of Attorney  Does patient want to make changes to medical advance directive? No - Patient declined - - No - Patient declined  Copy of [place] in Chart? - - - No - copy requested  Would patient like information on creating a medical advance directive? - No - Patient declined No - Patient declined -    Tobacco Social History   Tobacco Use  Smoking Status Former Smoker  . Packs/day: 1.00  . Years: 20.00  . Pack years: 20.00  . Types: Cigarettes  . Last attempt to quit: 05/15/2001  . Years since quitting: 16.7  Smokeless Tobacco Never Used     Counseling given: Not Answered   Clinical Intake:  Pre-visit preparation completed: Yes  Pain : No/denies pain     Nutritional Status: BMI 25 -29 Overweight Diabetes: No  How often do you need to have someone help you when you read instructions, pamphlets, or other written materials from your doctor or pharmacy?: 1 - Never  Interpreter Needed?: No     Past Medical History:  Diagnosis Date  . Bowel trouble   . Breast screening, unspecified    atypical ductal hyperplasia, declined excision.  . Colon polyp 2011  . COPD (chronic  obstructive pulmonary disease) ([place])   . Diverticulosis   . Duodenitis   . GERD (gastroesophageal reflux disease)   . Glaucoma   . Lump or mass in breast   . Personal history of tobacco use, presenting hazards to health   . Special screening for malignant neoplasms, colon    Past Surgical History:  Procedure Laterality Date  . BREAST BIOPSY Right 12/14/2011   3:00, Finesse biopsy,December 14, 2011. This was completed as her FNA showed atypia, papilloma, "features" of  ADH  identified. Declined formal excision. I elected not to offer chemoprevention without formal excision. .(January 13, 2012.)   . COLONOSCOPY  08/20/2009   Tubular adenoma x2. Transverse and descending colon Dr. Dionne Milo  . COLONOSCOPY WITH PROPOFOL N/A 04/02/2015   Tubular adenoma. F/U 2022.  Surgeon: Robert Bellow, MD;  Location: [facility]COPY;  Service: Endoscopy;  Laterality: N/A;   Family History  Problem Relation Age of Onset  . Heart disease Mother   . COPD Mother   . Hypertension Mother   . Cancer Father        metastatic prostate CA  . Stroke Sister        hemorrhagic CVA  . Heart disease Sister        restrictive cardiomyopathy  . Aneurysm Brother    Social History   Socioeconomic History  . Marital status: Widowed  Spouse name: Not on file  . Number of children: Not on file  . Years of education: Not on file  . Highest education level: Not on file  Occupational History  . Not on file  Social Needs  . Financial resource strain: Not hard at all  . Food insecurity:    Worry: Never true    Inability: Never true  . Transportation needs:    Medical: No    Non-medical: No  Tobacco Use  . Smoking status: Former Smoker    Packs/day: 1.00    Years: 20.00    Pack years: 20.00    Types: Cigarettes    Last attempt to quit: 05/15/2001    Years since quitting: 16.7  . Smokeless tobacco: Never Used  Substance and Sexual Activity  . Alcohol use: Yes    Alcohol/week: 3.0 standard drinks     Types: 3 Cans of beer per week  . Drug use: No  . Sexual activity: Not Currently  Lifestyle  . Physical activity:    Days per week: 0 days    Minutes per session: Not on file  . Stress: Not on file  Relationships  . Social connections:    Talks on phone: Not on file    Gets together: Not on file    Attends religious service: Not on file    Active member of club or organization: Not on file    Attends meetings of clubs or organizations: Not on file    Relationship status: Not on file  Other Topics Concern  . Not on file  Social History Narrative  . Not on file    Outpatient Encounter Medications as of 02/21/2018  Medication Sig  . bismuth subsalicylate (PEPTO BISMOL) 262 MG chewable tablet Chew 524 mg by mouth as needed.  . dicyclomine (BENTYL) 20 MG tablet TAKE 1 TABLET BY MOUTH THREE TIMES DAILY BEFORE MEALS  . hydrochlorothiazide (HYDRODIURIL) 12.5 MG tablet TAKE 1 TABLET(12.5 MG) BY MOUTH DAILY  . pantoprazole (PROTONIX) 40 MG tablet TAKE 1 TABLET(40 MG) BY MOUTH DAILY  . pravastatin (PRAVACHOL) 20 MG tablet TAKE 1 TABLET(20 MG) BY MOUTH DAILY  . [DISCONTINUED] hydrochlorothiazide (HYDRODIURIL) 25 MG tablet TAKE 1 TABLET(25 MG) BY MOUTH DAILY   No facility-administered encounter medications on file as of 02/21/2018.     Activities of Daily Living In your present state of health, do you have any difficulty performing the following activities: 02/21/2018  Hearing? N  Vision? N  Difficulty concentrating or making decisions? N  Walking or climbing stairs? N  Dressing or bathing? N  Doing errands, shopping? N  Preparing Food and eating ? N  Using the Toilet? N  In the past six months, have you accidently leaked urine? N  Do you have problems with loss of bowel control? N  Managing your Medications? N  Managing your Finances? N  Housekeeping or managing your Housekeeping? N  Some recent data might be hidden    Patient Care Team: Crecencio Mc, MD as PCP - General  (Internal Medicine) Bary Castilla, Forest Gleason, MD (General Surgery) Crecencio Mc, MD (Internal Medicine)    Assessment:   This is a routine wellness examination for [place].  The goal of the wellness visit is to assist the patient how to close the gaps in care and create a preventative care plan for the patient.   The roster of all physicians providing medical care to patient is listed in the Snapshot section of the chart.  Osteopenia.  Osteoporosis risk reviewed.    Safety issues reviewed; Smoke and carbon monoxide detectors in the home. No firearms in the home. Wears seatbelts when driving or riding with others. No violence in the home.  They do not have excessive sun exposure.  Discussed the need for sun protection: hats, long sleeves and the use of sunscreen if there is significant sun exposure.  Patient is alert, normal appearance, oriented to person/place/and time.  Correctly identified the president of the [place] and recalls of 3/3 words.  Performs simple calculations and can read correct time from watch face.  Displays appropriate judgement.  No new identified risk were noted.  No failures at ADL's or IADL's.    BMI- discussed the importance of a healthy diet, water intake and the benefits of aerobic exercise. Educational material provided.   24 hour diet recall: Regular diet  Dental- every 12 months.  Sleep patterns- Sleeps fair.   Regular dose influenza vaccine administered L deltoid, tolerated well. Educational material provided.  Pneumococcal vaccine discussed.    Patient Concerns: None at this time. Follow up with PCP as needed.  Exercise Activities and Dietary recommendations Current Exercise Habits: The patient does not participate in regular exercise at present  Goals    . DIET - REDUCE SUGAR INTAKE    . Increase physical activity     Dance when possible Walk for exercise        Fall Risk Fall Risk  02/21/2018 02/17/2017 02/18/2016 02/18/2015  Falls in  the past year? 0 No Yes No  Number falls in past yr: - - 1 -  Injury with Fall? - - No -  Follow up - - Falls prevention discussed -  Comment - - Uneven ground -   Depression Screen PHQ 2/9 Scores 02/21/2018 02/17/2017 02/18/2016 02/18/2015  PHQ - 2 Score 0 1 0 0  PHQ- 9 Score - 2 - -     Cognitive Function MMSE - Mini Mental State Exam 02/17/2017 02/18/2016 02/18/2015  Orientation to time 5 5 5   Orientation to Place 5 5 5   Registration 3 3 3   Attention/ Calculation 5 5 5   Recall 3 3 3   Language- name 2 objects 2 2 2   Language- repeat 1 1 1   Language- follow 3 step command 3 3 3   Language- read & follow direction 1 1 1   Write a sentence 1 1 1   Copy design 1 1 1   Total score 30 30 30      6CIT Screen 02/21/2018  What Year? 0 points  What month? 0 points  What time? 0 points  Count back from 20 0 points  Months in reverse 0 points  Repeat phrase 0 points  Total Score 0    Immunization History  Administered Date(s) Administered  . Hep A / Hep B 07/31/2014, 09/03/2014, 02/04/2015  . Influenza Split 02/04/2014, 01/31/2015  . Influenza, High Dose Seasonal PF 01/08/2016  . Influenza,inj,Quad PF,6+ Mos 01/31/2015, 12/13/2016, 02/21/2018  . Influenza-Unspecified 02/12/2013, 12/30/2016  . Pneumococcal Conjugate-13 01/31/2015  . Tdap 01/06/2010   Screening Tests Health Maintenance  Topic Date Due  . PNA vac Low Risk Adult (2 of 2 - PPSV23) 01/31/2016  . TETANUS/TDAP  01/07/2020  . MAMMOGRAM  02/09/2020  . COLONOSCOPY  04/01/2025  . INFLUENZA VACCINE  Completed  . DEXA SCAN  Completed  . Hepatitis C Screening  Completed      Plan:    End of life planning; Advance aging; Advanced directives discussed.HCPOA/Living Will declined.  Schedule eye exam.   Keep all routine maintenance appointments.   I have personally reviewed and noted the following in the patient's chart:   . Medical and social history . Use of alcohol, tobacco or illicit drugs  . Current medications  and supplements . Functional ability and status . Nutritional status . Physical activity . Advanced directives . List of other physicians . Hospitalizations, surgeries, and ER visits in previous 12 months . Vitals . Screenings to include cognitive, depression, and falls . Referrals and appointments  In addition, I have reviewed and discussed with patient certain preventive protocols, quality metrics, and best practice recommendations. A written personalized care plan for preventive services as well as general preventive health recommendations were provided to patient.     Varney Biles, LPN  [id number]   Reviewed above information.  Agree with assessment and plan.    Dr Nicki Reaper

## 2018-02-21 NOTE — Patient Instructions (Addendum)
  Sharon Webster , Thank you for taking time to come for your Medicare Wellness Visit. I appreciate your ongoing commitment to your health goals. Please review the following plan we discussed and let me know if I can assist you in the future.   Schedule an eye exam.   Keep all routine maintenance appointments.   These are the goals we discussed: Goals    . DIET - REDUCE SUGAR INTAKE    . Increase physical activity     Dance when possible Walk for exercise        This is a list of the screening recommended for you and due dates:  Health Maintenance  Topic Date Due  . Pneumonia vaccines (2 of 2 - PPSV23) 01/31/2016  . Tetanus Vaccine  01/07/2020  . Mammogram  02/09/2020  . Colon Cancer Screening  04/01/2025  . Flu Shot  Completed  . DEXA scan (bone density measurement)  Completed  .  Hepatitis C: One time screening is recommended by Center for Disease Control  (CDC) for  adults born from [birth year] through 1964.   Completed

## 2018-04-12 ENCOUNTER — Other Ambulatory Visit (INDEPENDENT_AMBULATORY_CARE_PROVIDER_SITE_OTHER): Payer: Medicare Other

## 2018-04-12 DIAGNOSIS — I10 Essential (primary) hypertension: Secondary | ICD-10-CM | POA: Diagnosis not present

## 2018-04-12 DIAGNOSIS — D519 Vitamin B12 deficiency anemia, unspecified: Secondary | ICD-10-CM

## 2018-04-12 DIAGNOSIS — T50905A Adverse effect of unspecified drugs, medicaments and biological substances, initial encounter: Secondary | ICD-10-CM

## 2018-04-12 NOTE — Addendum Note (Signed)
Addended by: Arby Barrette on: 04/12/2018 08:01 AM   Modules accepted: Orders

## 2018-04-14 LAB — BASIC METABOLIC PANEL
BUN: 17 mg/dL (ref 7–25)
CALCIUM: 9.8 mg/dL (ref 8.6–10.4)
CO2: 29 mmol/L (ref 20–32)
CREATININE: 0.76 mg/dL (ref 0.60–0.93)
Chloride: 104 mmol/L (ref 98–110)
GLUCOSE: 113 mg/dL — AB (ref 65–99)
Potassium: 4.1 mmol/L (ref 3.5–5.3)
Sodium: 142 mmol/L (ref 135–146)

## 2018-04-14 LAB — METHYLMALONIC ACID, SERUM: Methylmalonic Acid, Quant: 183 nmol/L (ref 87–318)

## 2018-04-17 ENCOUNTER — Ambulatory Visit (INDEPENDENT_AMBULATORY_CARE_PROVIDER_SITE_OTHER): Payer: Medicare Other | Admitting: Internal Medicine

## 2018-04-17 ENCOUNTER — Encounter: Payer: Self-pay | Admitting: Internal Medicine

## 2018-04-17 VITALS — BP 140/70 | HR 80 | Temp 98.5°F | Resp 15 | Ht 61.0 in | Wt 132.6 lb

## 2018-04-17 DIAGNOSIS — K760 Fatty (change of) liver, not elsewhere classified: Secondary | ICD-10-CM | POA: Diagnosis not present

## 2018-04-17 DIAGNOSIS — E7849 Other hyperlipidemia: Secondary | ICD-10-CM

## 2018-04-17 DIAGNOSIS — R7303 Prediabetes: Secondary | ICD-10-CM

## 2018-04-17 DIAGNOSIS — N6099 Unspecified benign mammary dysplasia of unspecified breast: Secondary | ICD-10-CM

## 2018-04-17 DIAGNOSIS — R8789 Other abnormal findings in specimens from female genital organs: Secondary | ICD-10-CM

## 2018-04-17 DIAGNOSIS — Z0000 Encounter for general adult medical examination without abnormal findings: Secondary | ICD-10-CM | POA: Diagnosis not present

## 2018-04-17 DIAGNOSIS — Z79899 Other long term (current) drug therapy: Secondary | ICD-10-CM | POA: Diagnosis not present

## 2018-04-17 DIAGNOSIS — R87618 Other abnormal cytological findings on specimens from cervix uteri: Secondary | ICD-10-CM

## 2018-04-17 DIAGNOSIS — I10 Essential (primary) hypertension: Secondary | ICD-10-CM | POA: Diagnosis not present

## 2018-04-17 LAB — HEPATIC FUNCTION PANEL
ALK PHOS: 78 U/L (ref 39–117)
ALT: 19 U/L (ref 0–35)
AST: 17 U/L (ref 0–37)
Albumin: 4.8 g/dL (ref 3.5–5.2)
BILIRUBIN DIRECT: 0.1 mg/dL (ref 0.0–0.3)
BILIRUBIN TOTAL: 0.4 mg/dL (ref 0.2–1.2)
Total Protein: 7.8 g/dL (ref 6.0–8.3)

## 2018-04-17 LAB — HEMOGLOBIN A1C: Hgb A1c MFr Bld: 5.9 % (ref 4.6–6.5)

## 2018-04-17 MED ORDER — ZOSTER VAC RECOMB ADJUVANTED 50 MCG/0.5ML IM SUSR: 0.5000 mL | Freq: Once | INTRAMUSCULAR | 1 refills | Status: AC

## 2018-04-17 NOTE — Patient Instructions (Addendum)
Your next colon cancer screening is due in 2022  Continue annual mammograms for breast cancer screening  .To prevent gas:  Take Beano with any meal containing legumes (hard beans0,  Or cruciferous vegetables  (broccoli, cauliflower, brussel sprouts)  Take Lactase with any meal containing  dairy  You can try Phasyme or Gas X for gas that occurs after a meal   Your  fasting glucose has never been  diagnostic of diabetes; but your A1c's in the past  confirms that  the tiny elevations we have noticing in your fasting sugars over the last 2 years are real  and mean that you are at slightly increased  risk for developing type 2 Diabetes over the next ten years.  Reducing your daily intake of starches,   And making it a point to exercise daily  Can help  delay the progression to diabetes for a long time.        Health Maintenance for Postmenopausal Women Menopause is a normal process in which your reproductive ability comes to an end. This process happens gradually over a span of months to years, usually between the ages of 56 and 65. Menopause is complete when you have missed 12 consecutive menstrual periods. It is important to talk with your health care provider about some of the most common conditions that affect postmenopausal women, such as heart disease, cancer, and bone loss (osteoporosis). Adopting a healthy lifestyle and getting preventive care can help to promote your health and wellness. Those actions can also lower your chances of developing some of these common conditions. What should I know about menopause? During menopause, you may experience a number of symptoms, such as:  Moderate-to-severe hot flashes.  Night sweats.  Decrease in sex drive.  Mood swings.  Headaches.  Tiredness.  Irritability.  Memory problems.  Insomnia. Choosing to treat or not to treat menopausal changes is an individual decision that you make with your health care provider. What should I know  about hormone replacement therapy and supplements? Hormone therapy products are effective for treating symptoms that are associated with menopause, such as hot flashes and night sweats. Hormone replacement carries certain risks, especially as you become older. If you are thinking about using estrogen or estrogen with progestin treatments, discuss the benefits and risks with your health care provider. What should I know about heart disease and stroke? Heart disease, heart attack, and stroke become more likely as you age. This may be due, in part, to the hormonal changes that your body experiences during menopause. These can affect how your body processes dietary fats, triglycerides, and cholesterol. Heart attack and stroke are both medical emergencies. There are many things that you can do to help prevent heart disease and stroke:  Have your blood pressure checked at least every 1-2 years. High blood pressure causes heart disease and increases the risk of stroke.  If you are 34-45 years old, ask your health care provider if you should take aspirin to prevent a heart attack or a stroke.  Do not use any tobacco products, including cigarettes, chewing tobacco, or electronic cigarettes. If you need help quitting, ask your health care provider.  It is important to eat a healthy diet and maintain a healthy weight. ? Be sure to include plenty of vegetables, fruits, low-fat dairy products, and lean protein. ? Avoid eating foods that are high in solid fats, added sugars, or salt (sodium).  Get regular exercise. This is one of the most important things that you  can do for your health. ? Try to exercise for at least 150 minutes each week. The type of exercise that you do should increase your heart rate and make you sweat. This is known as moderate-intensity exercise. ? Try to do strengthening exercises at least twice each week. Do these in addition to the moderate-intensity exercise.  Know your numbers.Ask  your health care provider to check your cholesterol and your blood glucose. Continue to have your blood tested as directed by your health care provider.  What should I know about cancer screening? There are several types of cancer. Take the following steps to reduce your risk and to catch any cancer development as early as possible. Breast Cancer  Practice breast self-awareness. ? This means understanding how your breasts normally appear and feel. ? It also means doing regular breast self-exams. Let your health care provider know about any changes, no matter how small.  If you are 34 or older, have a clinician do a breast exam (clinical breast exam or CBE) every year. Depending on your age, family history, and medical history, it may be recommended that you also have a yearly breast X-ray (mammogram).  If you have a family history of breast cancer, talk with your health care provider about genetic screening.  If you are at high risk for breast cancer, talk with your health care provider about having an MRI and a mammogram every year.  Breast cancer (BRCA) gene test is recommended for women who have family members with BRCA-related cancers. Results of the assessment will determine the need for genetic counseling and BRCA1 and for BRCA2 testing. BRCA-related cancers include these types: ? Breast. This occurs in males or females. ? Ovarian. ? Tubal. This may also be called fallopian tube cancer. ? Cancer of the abdominal or pelvic lining (peritoneal cancer). ? Prostate. ? Pancreatic. Cervical, Uterine, and Ovarian Cancer Your health care provider may recommend that you be screened regularly for cancer of the pelvic organs. These include your ovaries, uterus, and vagina. This screening involves a pelvic exam, which includes checking for microscopic changes to the surface of your cervix (Pap test).  For women ages 21-65, health care providers may recommend a pelvic exam and a Pap test every  three years. For women ages 33-65, they may recommend the Pap test and pelvic exam, combined with testing for human papilloma virus (HPV), every five years. Some types of HPV increase your risk of cervical cancer. Testing for HPV may also be done on women of any age who have unclear Pap test results.  Other health care providers may not recommend any screening for nonpregnant women who are considered low risk for pelvic cancer and have no symptoms. Ask your health care provider if a screening pelvic exam is right for you.  If you have had past treatment for cervical cancer or a condition that could lead to cancer, you need Pap tests and screening for cancer for at least 20 years after your treatment. If Pap tests have been discontinued for you, your risk factors (such as having a new sexual partner) need to be reassessed to determine if you should start having screenings again. Some women have medical problems that increase the chance of getting cervical cancer. In these cases, your health care provider may recommend that you have screening and Pap tests more often.  If you have a family history of uterine cancer or ovarian cancer, talk with your health care provider about genetic screening.  If you have  vaginal bleeding after reaching menopause, tell your health care provider.  There are currently no reliable tests available to screen for ovarian cancer. Lung Cancer Lung cancer screening is recommended for adults 7-36 years old who are at high risk for lung cancer because of a history of smoking. A yearly low-dose CT scan of the lungs is recommended if you:  Currently smoke.  Have a history of at least 30 pack-years of smoking and you currently smoke or have quit within the past 15 years. A pack-year is smoking an average of one pack of cigarettes per day for one year. Yearly screening should:  Continue until it has been 15 years since you quit.  Stop if you develop a health problem that would  prevent you from having lung cancer treatment. Colorectal Cancer  This type of cancer can be detected and can often be prevented.  Routine colorectal cancer screening usually begins at age 56 and continues through age 92.  If you have risk factors for colon cancer, your health care provider may recommend that you be screened at an earlier age.  If you have a family history of colorectal cancer, talk with your health care provider about genetic screening.  Your health care provider may also recommend using home test kits to check for hidden blood in your stool.  A small camera at the end of a tube can be used to examine your colon directly (sigmoidoscopy or colonoscopy). This is done to check for the earliest forms of colorectal cancer.  Direct examination of the colon should be repeated every 5-10 years until age 53. However, if early forms of precancerous polyps or small growths are found or if you have a family history or genetic risk for colorectal cancer, you may need to be screened more often. Skin Cancer  Check your skin from head to toe regularly.  Monitor any moles. Be sure to tell your health care provider: ? About any new moles or changes in moles, especially if there is a change in a mole's shape or color. ? If you have a mole that is larger than the size of a pencil eraser.  If any of your family members has a history of skin cancer, especially at a young age, talk with your health care provider about genetic screening.  Always use sunscreen. Apply sunscreen liberally and repeatedly throughout the day.  Whenever you are outside, protect yourself by wearing long sleeves, pants, a wide-brimmed hat, and sunglasses. What should I know about osteoporosis? Osteoporosis is a condition in which bone destruction happens more quickly than new bone creation. After menopause, you may be at an increased risk for osteoporosis. To help prevent osteoporosis or the bone fractures that can  happen because of osteoporosis, the following is recommended:  If you are 51-69 years old, get at least 1,000 mg of calcium and at least 600 mg of vitamin D per day.  If you are older than age 65 but younger than age 71, get at least 1,200 mg of calcium and at least 600 mg of vitamin D per day.  If you are older than age 38, get at least 1,200 mg of calcium and at least 800 mg of vitamin D per day. Smoking and excessive alcohol intake increase the risk of osteoporosis. Eat foods that are rich in calcium and vitamin D, and do weight-bearing exercises several times each week as directed by your health care provider. What should I know about how menopause affects my mental health?  Depression may occur at any age, but it is more common as you become older. Common symptoms of depression include:  Low or sad mood.  Changes in sleep patterns.  Changes in appetite or eating patterns.  Feeling an overall lack of motivation or enjoyment of activities that you previously enjoyed.  Frequent crying spells. Talk with your health care provider if you think that you are experiencing depression. What should I know about immunizations? It is important that you get and maintain your immunizations. These include:  Tetanus, diphtheria, and pertussis (Tdap) booster vaccine.  Influenza every year before the flu season begins.  Pneumonia vaccine.  Shingles vaccine. Your health care provider may also recommend other immunizations. This information is not intended to replace advice given to you by your health care provider. Make sure you discuss any questions you have with your health care provider. Document Released: 04/23/2005 Document Revised: 09/19/2015 Document Reviewed: 12/03/2014 Elsevier Interactive Patient Education  2019 [employer].

## 2018-04-17 NOTE — Progress Notes (Signed)
Patient ID: Sharon Webster, female    DOB: Feb 22, 1946  Age: 73 y.o. MRN: [id number]  The patient is here for annual preventive examination and management of other chronic and acute problems.    Normal mammogram [facility] Nov 2019   The risk factors are reflected in the social history.  The roster of all physicians providing medical care to patient - is listed in the Snapshot section of the chart.  Activities of daily living:  The patient is 100% independent in all ADLs: dressing, toileting, feeding as well as independent mobility  Home safety : The patient has smoke detectors in the home. They wear seatbelts.  There are no unsecured firearms at home. There is no violence in the home but her boyfriend is verbally critical and rude .   There is no risks for hepatitis, STDs or HIV. There is no   history of blood transfusion. They have no travel history to infectious disease endemic areas of the world.  The patient has NOT seen their dentist in the last six months.  Has a few remaining teeth but mostly dentures . She has seen her eye doctor in the last year. Sees a [facility] specialist regularly and has an appt in  2 weeks.  history of optic neuritis,  Pre glaucoma  . They deny any hearing difficulty They do not  have excessive sun exposure. Discussed the need for sun protection: hats, long sleeves and use of sunscreen if there is significant sun exposure.   Diet: the importance of a healthy diet is discussed. They do not have a healthy diet.  She eats twice daily,  Skips evening meal.  Eats cocoa crisps with Yoohoo most mornings.  Lactose intolerant.    The benefits of regular aerobic exercise were discussed. She walks 4 times per week ,  20 minutes.   Depression screen: there are several signs  of anxiety and depression- irritability and anxiety due to her recently estranged boyfriend's declining Health issues and unwelcomed return to her "(because he had nowhere else to go")  after leaving her for a  prior girlfriend.    Cognitive assessment: the patient manages all their financial and personal affairs and is actively engaged. They could relate day,date,year and events; recalled 2/3 objects at 3 minutes; performed clock-face test normally.  The following portions of the patient's history were reviewed and updated as appropriate: allergies, current medications, past family history, past medical history,  past surgical history, past social history  and problem list.  Visual acuity was not assessed per patient preference since she has regular follow up with her ophthalmologist. Hearing and body mass index were assessed and reviewed.   During the course of the visit the patient was educated and counseled about appropriate screening and preventive services including : fall prevention , diabetes screening, nutrition counseling, colorectal cancer screening, and recommended immunizations.    CC: The primary encounter diagnosis was Long-term use of high-risk medication. Diagnoses of Prediabetes, Atypical ductal hyperplasia of breast, Encounter for preventive health examination, Essential hypertension, Familial hyperlipidemia, high LDL, Pap smear abnormality of cervix/human papillomavirus (HPV) positive, and Hepatic steatosis were also pertinent to this visit.  History Sreshta has a past medical history of Bowel trouble, Breast screening, unspecified, Colon polyp (2011), COPD (chronic obstructive pulmonary disease) ([place]), Diverticulosis, Duodenitis, GERD (gastroesophageal reflux disease), Glaucoma, Lump or mass in breast, Personal history of tobacco use, presenting hazards to health, and Special screening for malignant neoplasms, colon.   She has a past surgical history that  includes Colonoscopy (08/20/2009); Breast biopsy (Right, 12/14/2011); and Colonoscopy with propofol (N/A, 04/02/2015).   Her family history includes Aneurysm in her brother; COPD in her mother; Cancer in her father; Heart disease in  her mother and sister; Hypertension in her mother; Stroke in her sister.She reports that she quit smoking about 16 years ago. Her smoking use included cigarettes. She has a 20.00 pack-year smoking history. She has never used smokeless tobacco. She reports current alcohol use of about 3.0 standard drinks of alcohol per week. She reports that she does not use drugs.  Outpatient Medications Prior to Visit  Medication Sig Dispense Refill  . bismuth subsalicylate (PEPTO BISMOL) 262 MG chewable tablet Chew 524 mg by mouth as needed.    . dicyclomine (BENTYL) 20 MG tablet TAKE 1 TABLET BY MOUTH THREE TIMES DAILY BEFORE MEALS 270 tablet 1  . hydrochlorothiazide (HYDRODIURIL) 12.5 MG tablet TAKE 1 TABLET(12.5 MG) BY MOUTH DAILY 90 tablet 1  . pantoprazole (PROTONIX) 40 MG tablet TAKE 1 TABLET(40 MG) BY MOUTH DAILY 90 tablet 0  . pravastatin (PRAVACHOL) 20 MG tablet TAKE 1 TABLET(20 MG) BY MOUTH DAILY 90 tablet 1   No facility-administered medications prior to visit.     Review of Systems   Patient denies headache, fevers, malaise, unintentional weight loss, skin rash, eye pain, sinus congestion and sinus pain, sore throat, dysphagia,  hemoptysis , cough, dyspnea, wheezing, chest pain, palpitations, orthopnea, edema, abdominal pain, nausea, melena, diarrhea, constipation, flank pain, dysuria, hematuria, urinary  Frequency, nocturia, numbness, tingling, seizures,  Focal weakness, Loss of consciousness,  Tremor, insomnia, depression, anxiety, and suicidal ideation.      Objective:  BP 140/70 (BP Location: Left Arm, Patient Position: Sitting, Cuff Size: Normal)   Pulse 80   Temp 98.5 F (36.9 C) (Oral)   Resp 15   Ht 5\' 1"  (1.549 m)   Wt 132 lb 9.6 oz (60.1 kg)   SpO2 96%   BMI 25.05 kg/m   Physical Exam   General appearance: alert, cooperative and appears stated age Head: Normocephalic, without obvious abnormality, atraumatic Eyes: conjunctivae/corneas clear. PERRL, EOM's intact. Fundi  benign. Ears: normal TM's and external ear canals both ears Nose: Nares normal. Septum midline. Mucosa normal. No drainage or sinus tenderness. Throat: lips, mucosa, and tongue normal; teeth and gums normal Neck: no adenopathy, no carotid bruit, no JVD, supple, symmetrical, trachea midline and thyroid not enlarged, symmetric, no tenderness/mass/nodules Lungs: clear to auscultation bilaterally Breasts: normal appearance, no masses or tenderness Heart: regular rate and rhythm, S1, S2 normal, no murmur, click, rub or gallop Abdomen: soft, non-tender; bowel sounds normal; no masses,  no organomegaly Extremities: extremities normal, atraumatic, no cyanosis or edema Pulses: 2+ and symmetric Skin: Skin color, texture, turgor normal. No rashes or lesions Neurologic: Alert and oriented X 3, normal strength and tone. Normal symmetric reflexes. Normal coordination and gait.     Assessment & Plan:   Problem List Items Addressed This Visit    Atypical ductal hyperplasia of breast    Normal mammogram [facility] Feb 08 2018      Encounter for preventive health examination    age appropriate education and counseling updated, referrals for preventative services and immunizations addressed, dietary and smoking counseling addressed, most recent labs reviewed.  I have personally reviewed and have noted:  1) the patient's medical and social history 2) The pt's use of alcohol, tobacco, and illicit drugs 3) The patient's current medications and supplements 4) Functional ability including ADL's, fall risk, home safety  risk, hearing and visual impairment 5) Diet and physical activities 6) Evidence for depression or mood disorder 7) The patient's height, weight, and BMI have been recorded in the chart  I have made referrals, and provided counseling and education based on review of the above      Essential hypertension    Well controlled on current regimen of 12.5 mg hctz. Renal function stable, no changes  today.  Lab Results  Component Value Date   CREATININE 0.76 04/12/2018   Lab Results  Component Value Date   NA 142 04/12/2018   K 4.1 04/12/2018   CL 104 04/12/2018   CO2 29 04/12/2018         Familial hyperlipidemia, high LDL    ;  She is taking pravastatin .and LDL is 96,  LFTs are normal     Lab Results  Component Value Date   CHOL 182 10/13/2017   HDL 51.70 10/13/2017   LDLCALC 96 10/13/2017   LDLDIRECT 122.0 01/07/2016   TRIG 170.0 (H) 10/13/2017   CHOLHDL 4 10/13/2017            Hepatic steatosis    Presumed by ultrasound changes and serologies negative for autoimmune causes of hepatitis.  Current liver enzymes are normal and all modifiable risk factors including obesity, diabetes and hyperlipidemia have been addressed .  Hepatitis A/B vaccines have been given  Lab Results  Component Value Date   ALT 19 04/17/2018   AST 17 04/17/2018   ALKPHOS 78 04/17/2018   BILITOT 0.4 04/17/2018         Pap smear abnormality of cervix/human papillomavirus (HPV) positive    GYN evaluation has been done . HPV risk profile was negative for high risk..  No additional PAP smear screening is necessary per GYN      Prediabetes    Her random glucose is again  not diagnostic of diabetes but her A1c  Has dropped .    I recommend she follow a low glycemic index diet and particpate regularly in an aerobic  exercise activity.  We should check an A1c in 6 months.   Lab Results  Component Value Date   HGBA1C 5.9 04/17/2018         Relevant Orders   Hemoglobin A1c (Completed)    Other Visit Diagnoses    Long-term use of high-risk medication    -  Primary   Relevant Orders   Hepatic function panel (Completed)      I am having Quincy Simmonds start on Zoster Vaccine Adjuvanted. I am also having her maintain her bismuth subsalicylate, pravastatin, dicyclomine, hydrochlorothiazide, and pantoprazole.  Meds ordered this encounter  Medications  . Zoster Vaccine  Adjuvanted [facility]) injection    Sig: Inject 0.5 mLs into the muscle once for 1 dose.    Dispense:  1 each    Refill:  1    There are no discontinued medications.  Follow-up: No follow-ups on file.   Crecencio Mc, MD

## 2018-04-18 NOTE — Assessment & Plan Note (Signed)
Her random glucose is again  not diagnostic of diabetes but her A1c  Has dropped .    I recommend she follow a low glycemic index diet and particpate regularly in an aerobic  exercise activity.  We should check an A1c in 6 months.   Lab Results  Component Value Date   HGBA1C 5.9 04/17/2018

## 2018-04-18 NOTE — Assessment & Plan Note (Signed)
GYN evaluation has been done . HPV risk profile was negative for high risk..  No additional PAP smear screening is necessary per GYN

## 2018-04-18 NOTE — Assessment & Plan Note (Signed)

## 2018-04-18 NOTE — Assessment & Plan Note (Signed)
Normal mammogram [facility] Feb 08 2018

## 2018-04-18 NOTE — Assessment & Plan Note (Signed)
Presumed by ultrasound changes and serologies negative for autoimmune causes of hepatitis.  Current liver enzymes are normal and all modifiable risk factors including obesity, diabetes and hyperlipidemia have been addressed .  Hepatitis A/B vaccines have been given  Lab Results  Component Value Date   ALT 19 04/17/2018   AST 17 04/17/2018   ALKPHOS 78 04/17/2018   BILITOT 0.4 04/17/2018

## 2018-04-18 NOTE — Assessment & Plan Note (Signed)
;    She is taking pravastatin .and LDL is 96,  LFTs are normal     Lab Results  Component Value Date   CHOL 182 10/13/2017   HDL 51.70 10/13/2017   LDLCALC 96 10/13/2017   LDLDIRECT 122.0 01/07/2016   TRIG 170.0 (H) 10/13/2017   CHOLHDL 4 10/13/2017

## 2018-04-18 NOTE — Assessment & Plan Note (Signed)
Well controlled on current regimen of 12.5 mg hctz. Renal function stable, no changes today.  Lab Results  Component Value Date   CREATININE 0.76 04/12/2018   Lab Results  Component Value Date   NA 142 04/12/2018   K 4.1 04/12/2018   CL 104 04/12/2018   CO2 29 04/12/2018

## 2018-04-21 ENCOUNTER — Other Ambulatory Visit: Payer: Self-pay | Admitting: Internal Medicine

## 2018-05-22 ENCOUNTER — Other Ambulatory Visit: Payer: Self-pay | Admitting: Internal Medicine

## 2018-06-10 ENCOUNTER — Other Ambulatory Visit: Payer: Self-pay | Admitting: Internal Medicine

## 2018-07-11 ENCOUNTER — Other Ambulatory Visit: Payer: Self-pay

## 2018-07-11 MED ORDER — PANTOPRAZOLE SODIUM 40 MG PO TBEC: DELAYED_RELEASE_TABLET | ORAL | 1 refills | Status: DC

## 2018-09-04 ENCOUNTER — Other Ambulatory Visit: Payer: Self-pay | Admitting: Internal Medicine

## 2018-09-04 MED ORDER — PRAVASTATIN SODIUM 20 MG PO TABS: ORAL_TABLET | ORAL | 1 refills | Status: DC

## 2018-09-04 NOTE — Telephone Encounter (Signed)
Medication Refill - Medication: pravastatin (PRAVACHOL) 20 MG tablet  Has the patient contacted their pharmacy? Yes advised to call office. Preferred Pharmacy (with phone number or street name):  [id number] - [address], [place] [phone] (Phone) [phone] (Fax)   Agent: Please be advised that RX refills may take up to 3 business days. We ask that you follow-up with your pharmacy.

## 2018-10-19 ENCOUNTER — Encounter: Payer: Self-pay | Admitting: General Surgery

## 2018-11-17 ENCOUNTER — Other Ambulatory Visit: Payer: Self-pay | Admitting: Internal Medicine

## 2018-11-21 ENCOUNTER — Other Ambulatory Visit: Payer: Self-pay

## 2018-11-21 MED ORDER — PANTOPRAZOLE SODIUM 40 MG PO TBEC: DELAYED_RELEASE_TABLET | ORAL | 1 refills | Status: DC

## 2018-11-21 MED ORDER — DICYCLOMINE HCL 20 MG PO TABS: ORAL_TABLET | ORAL | 1 refills | Status: DC

## 2019-02-15 ENCOUNTER — Other Ambulatory Visit: Payer: Self-pay

## 2019-02-15 MED ORDER — PRAVASTATIN SODIUM 20 MG PO TABS: ORAL_TABLET | ORAL | 0 refills | Status: DC

## 2019-02-23 ENCOUNTER — Encounter (INDEPENDENT_AMBULATORY_CARE_PROVIDER_SITE_OTHER): Payer: Self-pay

## 2019-02-23 ENCOUNTER — Ambulatory Visit (INDEPENDENT_AMBULATORY_CARE_PROVIDER_SITE_OTHER): Payer: Medicare Other

## 2019-02-23 ENCOUNTER — Other Ambulatory Visit: Payer: Self-pay

## 2019-02-23 DIAGNOSIS — Z0000 Encounter for general adult medical examination without abnormal findings: Secondary | ICD-10-CM

## 2019-02-23 NOTE — Progress Notes (Signed)
Subjective:   Sharon Webster is a 73 y.o. female who presents for Medicare Annual (Subsequent) preventive examination.  Review of Systems:  No ROS.  Medicare Wellness Virtual Visit.  Visual/audio telehealth visit, UTA vital signs.   See social history for additional risk factors.   Cardiac Risk Factors include: advanced age (>57men, >41 women);hypertension     Objective:     Vitals: There were no vitals taken for this visit.  There is no height or weight on file to calculate BMI.  Advanced Directives 02/21/2018 02/17/2017 02/18/2016 02/18/2015  Does Patient Have a Medical Advance Directive? No No No Yes  Type of Advance Directive - - - Living will;Healthcare Power of Attorney  Does patient want to make changes to medical advance directive? No - Patient declined - - No - Patient declined  Copy of [place] in Chart? - - - No - copy requested  Would patient like information on creating a medical advance directive? - No - Patient declined No - Patient declined -    Tobacco Social History   Tobacco Use  Smoking Status Former Smoker  . Packs/day: 1.00  . Years: 20.00  . Pack years: 20.00  . Types: Cigarettes  . Quit date: 05/15/2001  . Years since quitting: 17.7  Smokeless Tobacco Never Used     Counseling given: Not Answered   Clinical Intake:  Pre-visit preparation completed: Yes        Diabetes: No  How often do you need to have someone help you when you read instructions, pamphlets, or other written materials from your doctor or pharmacy?: 1 - Never  Interpreter Needed?: No     Past Medical History:  Diagnosis Date  . Bowel trouble   . Breast screening, unspecified    atypical ductal hyperplasia, declined excision.  . Colon polyp 2011  . COPD (chronic obstructive pulmonary disease) ([place])   . Diverticulosis   . Duodenitis   . GERD (gastroesophageal reflux disease)   . Glaucoma   . Lump or mass in breast   . Personal history of  tobacco use, presenting hazards to health   . Special screening for malignant neoplasms, colon    Past Surgical History:  Procedure Laterality Date  . BREAST BIOPSY Right 12/14/2011   3:00, Finesse biopsy,December 14, 2011. This was completed as her FNA showed atypia, papilloma, "features" of  ADH  identified. Declined formal excision. I elected not to offer chemoprevention without formal excision. .(January 13, 2012.)   . COLONOSCOPY  08/20/2009   Tubular adenoma x2. Transverse and descending colon Dr. Dionne Milo  . COLONOSCOPY WITH PROPOFOL N/A 04/02/2015   Tubular adenoma. F/U 2022.  Surgeon: Robert Bellow, MD;  Location: [facility] END[facility]COPY;  Service: Endoscopy;  Laterality: N/A;   Family History  Problem Relation Age of Onset  . Heart disease Mother   . COPD Mother   . Hypertension Mother   . Cancer Father        metastatic prostate CA  . Stroke Sister        hemorrhagic CVA  . Heart disease Sister        restrictive cardiomyopathy  . Aneurysm Brother    Social History   Socioeconomic History  . Marital status: Widowed    Spouse name: Not on file  . Number of children: Not on file  . Years of education: Not on file  . Highest education level: Not on file  Occupational History  . Not on file  Tobacco  Use  . Smoking status: Former Smoker    Packs/day: 1.00    Years: 20.00    Pack years: 20.00    Types: Cigarettes    Quit date: 05/15/2001    Years since quitting: 17.7  . Smokeless tobacco: Never Used  Substance and Sexual Activity  . Alcohol use: Yes    Alcohol/week: 3.0 standard drinks    Types: 3 Cans of beer per week  . Drug use: No  . Sexual activity: Not Currently  Other Topics Concern  . Not on file  Social History Narrative  . Not on file   Social Determinants of Health   Financial Resource Strain:   . Difficulty of Paying Living Expenses: Not on file  Food Insecurity:   . Worried About Charity fundraiser in the Last Year: Not on file  . Ran Out of Food  in the Last Year: Not on file  Transportation Needs:   . Lack of Transportation (Medical): Not on file  . Lack of Transportation (Non-Medical): Not on file  Physical Activity:   . Days of Exercise per Week: Not on file  . Minutes of Exercise per Session: Not on file  Stress:   . Feeling of Stress : Not on file  Social Connections:   . Frequency of Communication with Friends and Family: Not on file  . Frequency of Social Gatherings with Friends and Family: Not on file  . Attends Religious Services: Not on file  . Active Member of Clubs or Organizations: Not on file  . Attends Archivist Meetings: Not on file  . Marital Status: Not on file    Outpatient Encounter Medications as of 02/23/2019  Medication Sig  . bismuth subsalicylate (PEPTO BISMOL) 262 MG chewable tablet Chew 524 mg by mouth as needed.  . dicyclomine (BENTYL) 20 MG tablet TAKE 1 TABLET BY MOUTH THREE TIMES DAILY BEFORE MEALS  . hydrochlorothiazide (HYDRODIURIL) 12.5 MG tablet TAKE 1 TABLET(12.5 MG) BY MOUTH DAILY  . pantoprazole (PROTONIX) 40 MG tablet TAKE 1 TABLET(40 MG) BY MOUTH DAILY  . pravastatin (PRAVACHOL) 20 MG tablet TAKE 1 TABLET(20 MG) BY MOUTH DAILY   No facility-administered encounter medications on file as of 02/23/2019.    Activities of Daily Living In your present state of health, do you have any difficulty performing the following activities: 02/23/2019  Hearing? N  Vision? N  Difficulty concentrating or making decisions? N  Walking or climbing stairs? N  Dressing or bathing? N  Doing errands, shopping? N  Preparing Food and eating ? N  Using the Toilet? N  In the past six months, have you accidently leaked urine? N  Do you have problems with loss of bowel control? N  Managing your Medications? N  Managing your Finances? N  Housekeeping or managing your Housekeeping? N  Some recent data might be hidden    Patient Care Team: Crecencio Mc, MD as PCP - General (Internal  Medicine) Bary Castilla, Forest Gleason, MD (General Surgery) Crecencio Mc, MD (Internal Medicine)    Assessment:   This is a routine wellness examination for [place].  Nurse connected with patient 02/23/19 at  9:00 AM EST by a telephone enabled telemedicine application and verified that I am speaking with the correct person using two identifiers. Patient stated full name and DOB. Patient gave permission to continue with virtual visit. Patient's location was at home and Nurse's location was at [place] office.   Health Maintenance Due: -PNA 23- discussed; to be  completed with doctor in visit or local pharmacy.   -Mammogram- plans to schedule  Update all pending maintenance due as appropriate.   See completed HM at the end of note.   Eye: Visual acuity not assessed. Virtual visit. Wears corrective lenses. Followed by their ophthalmologist every 12 months.   Dental: Visits dental school as needed.   Partial dentures- yes  Hearing: Demonstrates normal hearing during visit.  Safety:  Patient feels safe at home- yes Patient does have smoke detectors at home- yes Patient does wear sunscreen or protective clothing when in direct sunlight - yes Patient does wear seat belt when in a moving vehicle - yes Patient drives- yes Adequate lighting in walkways free from debris- yes Grab bars and handrails used as appropriate- yes Ambulates with no assistive device Cell phone or lifeline/life alert/medic alert on person when ambulating outside of the home- yes  Social: Alcohol intake - yes      Smoking history- former   Smokers in home? none Illicit drug use? none  Depression: PHQ 2 &9 complete. See screening below. Denies irritability, anhedonia, sadness/tearfullness.    Falls: See screening below.    Medication: Taking as directed and without issues.   Covid-19: Precautions and sickness symptoms discussed. Wears mask, social distancing, hand hygiene as appropriate.   Activities of  Daily Living Patient denies needing assistance with: household chores, feeding themselves, getting from bed to chair, getting to the toilet, bathing/showering, dressing, managing money, or preparing meals.   Memory: Patient is alert. Patient denies difficulty focusing or concentrating. Patient likes to reads the newspaper for brain stimulation.   BMI- discussed the importance of a healthy diet, water intake and the benefits of aerobic exercise.  Educational material provided.  Physical activity- yard work, no routine.   Diet:  Regular Water: fair intake  Other Providers Patient Care Team: Crecencio Mc, MD as PCP - General (Internal Medicine) Bary Castilla, Forest Gleason, MD (General Surgery) Crecencio Mc, MD (Internal Medicine)   Exercise Activities and Dietary recommendations Current Exercise Habits: Home exercise routine  Goals    . DIET - REDUCE SUGAR INTAKE    . Increase physical activity     Dance when possible Walk for exercise        Fall Risk Fall Risk  02/23/2019 02/21/2018 02/17/2017 02/18/2016 02/18/2015  Falls in the past year? 0 0 No Yes No  Number falls in past yr: - - - 1 -  Injury with Fall? - - - No -  Follow up Falls prevention discussed - - Falls prevention discussed -  Comment - - - Uneven ground -   Timed Get Up and Go performed: no, virtual visit  Depression Screen PHQ 2/9 Scores 02/23/2019 02/21/2018 02/17/2017 02/18/2016  PHQ - 2 Score 0 0 1 0  PHQ- 9 Score - - 2 -     Cognitive Function MMSE - Mini Mental State Exam 02/17/2017 02/18/2016 02/18/2015  Orientation to time 5 5 5   Orientation to Place 5 5 5   Registration 3 3 3   Attention/ Calculation 5 5 5   Recall 3 3 3   Language- name 2 objects 2 2 2   Language- repeat 1 1 1   Language- follow 3 step command 3 3 3   Language- read & follow direction 1 1 1   Write a sentence 1 1 1   Copy design 1 1 1   Total score 30 30 30      6CIT Screen 02/23/2019 02/21/2018  What Year? 0 points 0 points  What  month? 0 points 0 points  What time? 0 points 0 points  Count back from 20 0 points 0 points  Months in reverse 0 points 0 points  Repeat phrase 0 points 0 points  Total Score 0 0    Immunization History  Administered Date(s) Administered  . Hep A / Hep B 07/31/2014, 09/03/2014, 02/04/2015  . Influenza Split 02/04/2014, 01/31/2015  . Influenza, High Dose Seasonal PF 01/08/2016  . Influenza,inj,Quad PF,6+ Mos 01/31/2015, 12/13/2016, 02/21/2018, 12/12/2018  . Influenza-Unspecified 02/12/2013, 12/30/2016  . Pneumococcal Conjugate-13 01/31/2015  . Tdap 01/06/2010   Screening Tests Health Maintenance  Topic Date Due  . PNA vac Low Risk Adult (2 of 2 - PPSV23) 01/31/2016  . TETANUS/TDAP  01/07/2020  . MAMMOGRAM  02/09/2020  . COLONOSCOPY  04/01/2025  . INFLUENZA VACCINE  Completed  . DEXA SCAN  Completed  . Hepatitis C Screening  Completed       Plan:   Keep all routine maintenance appointments.   Follow up 02/26/19  Cpe 04/19/19 @ 10:00  Medicare Attestation I have personally reviewed: The patient's medical and social history Their use of alcohol, tobacco or illicit drugs Their current medications and supplements The patient's functional ability including ADLs,fall risks, home safety risks, cognitive, and hearing and visual impairment Diet and physical activities Evidence for depression   In addition, I have reviewed and discussed with patient certain preventive protocols, quality metrics, and best practice recommendations. A written personalized care plan for preventive services as well as general preventive health recommendations were provided to patient via mail.     Varney Biles, LPN  [id number]

## 2019-02-23 NOTE — Patient Instructions (Addendum)
  Sharon Webster , Thank you for taking time to come for your Medicare Wellness Visit. I appreciate your ongoing commitment to your health goals. Please review the following plan we discussed and let me know if I can assist you in the future.   These are the goals we discussed: Goals    . DIET - REDUCE SUGAR INTAKE    . Increase physical activity     Dance when possible Walk for exercise        This is a list of the screening recommended for you and due dates:  Health Maintenance  Topic Date Due  . Pneumonia vaccines (2 of 2 - PPSV23) 01/31/2016  . Tetanus Vaccine  01/07/2020  . Mammogram  02/09/2020  . Colon Cancer Screening  04/01/2025  . Flu Shot  Completed  . DEXA scan (bone density measurement)  Completed  .  Hepatitis C: One time screening is recommended by Center for Disease Control  (CDC) for  adults born from [birth year] through 1964.   Completed

## 2019-02-26 ENCOUNTER — Encounter: Payer: Self-pay | Admitting: Internal Medicine

## 2019-02-26 ENCOUNTER — Other Ambulatory Visit: Payer: Self-pay

## 2019-02-26 ENCOUNTER — Ambulatory Visit (INDEPENDENT_AMBULATORY_CARE_PROVIDER_SITE_OTHER): Payer: Medicare Other | Admitting: Internal Medicine

## 2019-02-26 ENCOUNTER — Ambulatory Visit: Payer: Self-pay

## 2019-02-26 VITALS — Ht 61.0 in | Wt 132.0 lb

## 2019-02-26 DIAGNOSIS — Z1239 Encounter for other screening for malignant neoplasm of breast: Secondary | ICD-10-CM

## 2019-02-26 DIAGNOSIS — I10 Essential (primary) hypertension: Secondary | ICD-10-CM | POA: Diagnosis not present

## 2019-02-26 DIAGNOSIS — T50905A Adverse effect of unspecified drugs, medicaments and biological substances, initial encounter: Secondary | ICD-10-CM

## 2019-02-26 DIAGNOSIS — E739 Lactose intolerance, unspecified: Secondary | ICD-10-CM

## 2019-02-26 DIAGNOSIS — R7303 Prediabetes: Secondary | ICD-10-CM | POA: Diagnosis not present

## 2019-02-26 DIAGNOSIS — K760 Fatty (change of) liver, not elsewhere classified: Secondary | ICD-10-CM

## 2019-02-26 DIAGNOSIS — E7849 Other hyperlipidemia: Secondary | ICD-10-CM | POA: Diagnosis not present

## 2019-02-26 DIAGNOSIS — D519 Vitamin B12 deficiency anemia, unspecified: Secondary | ICD-10-CM

## 2019-02-26 NOTE — Assessment & Plan Note (Signed)
Has not had BP check since last OV>  RN VISIT NEEDED.

## 2019-02-26 NOTE — Progress Notes (Signed)
Virtual Visit converted to Telephone  Note  This visit type was conducted due to national recommendations for restrictions regarding the COVID-19 pandemic (e.g. social distancing).  This format is felt to be most appropriate for this patient at this time.  All issues noted in this document were discussed and addressed.  No physical exam was performed (except for noted visual exam findings with Video Visits).   I attempted to connect with@ on 02/26/19 at  9:30 AM EST by a video enabled telemedicine application and verified that I am speaking with the correct person using two identifiers.  Interactive audio and video telecommunications were initially established beteen this provider and patient, however ultimately failed, due to patient having technical difficulties. We continued and completed visit with audio only  and verified that I am speaking with the correct person using two identifiers  Location patient: home Location provider: work or home office Persons participating in the virtual visit: patient, provider  I discussed the limitations, risks, security and privacy concerns of performing an evaluation and management service by telephone and the availability of in person appointments. I also discussed with the patient that there may be a patient responsible charge related to this service. The patient expressed understanding and agreed to proceed.   Reason for visit: follow up  HPI:  IBS;  Using bentyl twice daily ,  Avoiding lettuce . Tolerating side effects of dry mouth . Drinks only 8 ounces of water but using  Coffee,  Orange juice     Prediabetes : has a sugar rich diet .  Drink's yoo hoo's  And eats frosted flakes with yoohoo for breakfast.  Has lactose intolerance.  Eats a late lunch  Does a lot of yard work for exercise  And averages 3 days per week    ROS: See pertinent positives and negatives per HPI.  Past Medical History:  Diagnosis Date  . Bowel trouble   . Breast  screening, unspecified    atypical ductal hyperplasia, declined excision.  . Colon polyp 2011  . COPD (chronic obstructive pulmonary disease) ([place])   . Diverticulosis   . Duodenitis   . GERD (gastroesophageal reflux disease)   . Glaucoma   . Lump or mass in breast   . Personal history of tobacco use, presenting hazards to health   . Special screening for malignant neoplasms, colon     Past Surgical History:  Procedure Laterality Date  . BREAST BIOPSY Right 12/14/2011   3:00, Finesse biopsy,December 14, 2011. This was completed as her FNA showed atypia, papilloma, "features" of  ADH  identified. Declined formal excision. I elected not to offer chemoprevention without formal excision. .(January 13, 2012.)   . COLONOSCOPY  08/20/2009   Tubular adenoma x2. Transverse and descending colon Dr. Dionne Milo  . COLONOSCOPY WITH PROPOFOL N/A 04/02/2015   Tubular adenoma. F/U 2022.  Surgeon: Robert Bellow, MD;  Location: [facility];  Service: Endoscopy;  Laterality: N/A;    Family History  Problem Relation Age of Onset  . Heart disease Mother   . COPD Mother   . Hypertension Mother   . Cancer Father        metastatic prostate CA  . Stroke Sister        hemorrhagic CVA  . Heart disease Sister        restrictive cardiomyopathy  . Aneurysm Brother     SOCIAL HX:  reports that she quit smoking about 17 years ago. Her smoking use included cigarettes. She has a  20.00 pack-year smoking history. She has never used smokeless tobacco. She reports current alcohol use of about 3.0 standard drinks of alcohol per week. She reports that she does not use drugs.   Current Outpatient Medications:  .  cholecalciferol (VITAMIN D3) 25 MCG (1000 UT) tablet, Take 1,000 Units by mouth daily., Disp: , Rfl:  .  dicyclomine (BENTYL) 20 MG tablet, TAKE 1 TABLET BY MOUTH THREE TIMES DAILY BEFORE MEALS, Disp: 270 tablet, Rfl: 1 .  hydrochlorothiazide (HYDRODIURIL) 12.5 MG tablet, TAKE 1 TABLET(12.5 MG) BY MOUTH  DAILY, Disp: 90 tablet, Rfl: 1 .  pantoprazole (PROTONIX) 40 MG tablet, TAKE 1 TABLET(40 MG) BY MOUTH DAILY, Disp: 90 tablet, Rfl: 1 .  pravastatin (PRAVACHOL) 20 MG tablet, TAKE 1 TABLET(20 MG) BY MOUTH DAILY, Disp: 90 tablet, Rfl: 0 .  zinc gluconate 50 MG tablet, Take 50 mg by mouth daily., Disp: , Rfl:   EXAM:  VITALS per patient if applicable:  GENERAL: alert, oriented, appears well and in no acute distress  HEENT: atraumatic, conjunttiva clear, no obvious abnormalities on inspection of external nose and ears  NECK: normal movements of the head and neck  LUNGS: on inspection no signs of respiratory distress, breathing rate appears normal, no obvious gross SOB, gasping or wheezing  CV: no obvious cyanosis  MS: moves all visible extremities without noticeable abnormality  PSYCH/NEURO: pleasant and cooperative, no obvious depression or anxiety, speech and thought processing grossly intact  ASSESSMENT AND PLAN:  Discussed the following assessment and plan:  Encounter for breast cancer screening other than mammogram - Plan: Screening Mammogram [facility]  Essential hypertension - Plan: Comprehensive metabolic panel  Familial hyperlipidemia, high LDL - Plan: Lipid panel, Vitamin B12  Prediabetes - Plan: Hemoglobin A1c  Lactose intolerance in adult  Hepatic steatosis  Drug-induced vitamin B12 deficiency anemia  Lactose intolerance in adult Dairy causes her excessive flatulence without diarrhea.   Essential hypertension Has not had BP check since last OV>  RN VISIT NEEDED.    Hepatic steatosis Continue pravastatin  .  Will consider adding metformin once a1c has been reviewed  Prediabetes Advised to increase physical activity and reduce sugar in diet . Labs ordered    I discussed the assessment and treatment plan with the patient. The patient was provided an opportunity to ask questions and all were answered. The patient agreed with the plan and demonstrated an  understanding of the instructions.   The patient was advised to call back or seek an in-person evaluation if the symptoms worsen or if the condition fails to improve as anticipated.   I provided  25 minutes of non-face-to-face time during this encounter reviewing patient's current problems and past procedures/imaging studies, providing counseling on the above mentioned problems , and coordination  of care .  Crecencio Mc, MD

## 2019-02-26 NOTE — Patient Instructions (Addendum)
You have too much sugar in your diet!   You might want to try a premixed protein drink called Premier Protein shake for breakfast or late night snack . It is great tasting,   very low sugar and available of < $2 serving at [facility] and  In bulk for $1.50/serving at [employer] and [employer]  .    Nutritional analysis :  160 cal  30 g protein  1 g sugar 50% calcium needs   [employer] and [employer]  Comes in chocolate  ,  Vanilla and caramel    Mammogram ordered   Fasting labs needed

## 2019-02-26 NOTE — Assessment & Plan Note (Signed)
Dairy causes her excessive flatulence without diarrhea.

## 2019-02-26 NOTE — Assessment & Plan Note (Addendum)
20 minutes was spent discussing role of diet and exercise in preventing diabetes. Advised to increase physical activity and reduce sugar in diet . Labs ordered

## 2019-02-26 NOTE — Assessment & Plan Note (Signed)
Continue pravastatin  .  Will consider adding metformin once a1c has been reviewed 

## 2019-02-28 ENCOUNTER — Other Ambulatory Visit: Payer: Self-pay

## 2019-02-28 ENCOUNTER — Encounter: Payer: Self-pay | Admitting: *Deleted

## 2019-02-28 ENCOUNTER — Other Ambulatory Visit (INDEPENDENT_AMBULATORY_CARE_PROVIDER_SITE_OTHER): Payer: Medicare Other

## 2019-02-28 ENCOUNTER — Ambulatory Visit: Payer: Medicare Other | Admitting: *Deleted

## 2019-02-28 VITALS — BP 128/72 | HR 82 | Temp 97.6°F | Resp 16 | Ht 61.0 in | Wt 132.2 lb

## 2019-02-28 DIAGNOSIS — R7303 Prediabetes: Secondary | ICD-10-CM | POA: Diagnosis not present

## 2019-02-28 DIAGNOSIS — I10 Essential (primary) hypertension: Secondary | ICD-10-CM

## 2019-02-28 DIAGNOSIS — E7849 Other hyperlipidemia: Secondary | ICD-10-CM

## 2019-02-28 DIAGNOSIS — E538 Deficiency of other specified B group vitamins: Secondary | ICD-10-CM | POA: Insufficient documentation

## 2019-02-28 LAB — COMPREHENSIVE METABOLIC PANEL
ALT: 16 U/L (ref 0–35)
AST: 16 U/L (ref 0–37)
Albumin: 4.6 g/dL (ref 3.5–5.2)
Alkaline Phosphatase: 77 U/L (ref 39–117)
BUN: 17 mg/dL (ref 6–23)
CO2: 32 mEq/L (ref 19–32)
Calcium: 9.7 mg/dL (ref 8.4–10.5)
Chloride: 100 mEq/L (ref 96–112)
Creatinine, Ser: 0.83 mg/dL (ref 0.40–1.20)
GFR: 67.47 mL/min (ref >60.00)
Glucose, Bld: 109 mg/dL — ABNORMAL HIGH (ref 70–99)
Potassium: 3.6 mEq/L (ref 3.5–5.1)
Sodium: 139 mEq/L (ref 135–145)
Total Bilirubin: 0.6 mg/dL (ref 0.2–1.2)
Total Protein: 7.2 g/dL (ref 6.0–8.3)

## 2019-02-28 LAB — LIPID PANEL
Cholesterol: 194 mg/dL (ref 0–200)
HDL: 50.7 mg/dL (ref >39.00)
LDL Cholesterol: 122 mg/dL — ABNORMAL HIGH (ref 0–99)
NonHDL: 142.98
Total CHOL/HDL Ratio: 4
Triglycerides: 103 mg/dL (ref 0.0–149.0)
VLDL: 20.6 mg/dL (ref 0.0–40.0)

## 2019-02-28 LAB — HEMOGLOBIN A1C: Hgb A1c MFr Bld: 6 % (ref 4.6–6.5)

## 2019-02-28 LAB — VITAMIN B12: Vitamin B-12: 260 pg/mL (ref 211–911)

## 2019-02-28 NOTE — Progress Notes (Signed)
Patient here for nurse visit BP check per order from 02/26/19.   Patient reports compliance with prescribed BP medications: yes  Last dose of BP medication: 02/28/19 @ 0400  BP Readings from Last 3 Encounters:  02/28/19 128/72  04/17/18 140/70  02/21/18 126/74   Pulse Readings from Last 3 Encounters:  02/28/19 82  04/17/18 80  02/21/18 83      Patient verbalized understanding of instructions.   Kerin Salen, RN

## 2019-02-28 NOTE — Addendum Note (Signed)
Addended by: Crecencio Mc on: 02/28/2019 11:38 PM   Modules accepted: Orders

## 2019-03-14 ENCOUNTER — Ambulatory Visit: Payer: Medicare Other

## 2019-03-14 ENCOUNTER — Other Ambulatory Visit: Payer: Self-pay

## 2019-03-14 DIAGNOSIS — E538 Deficiency of other specified B group vitamins: Secondary | ICD-10-CM

## 2019-03-14 MED ORDER — CYANOCOBALAMIN 1000 MCG/ML IJ SOLN
1000.0000 ug | Freq: Once | INTRAMUSCULAR | Status: AC
  Administered 2019-03-14: 1000 ug via INTRAMUSCULAR

## 2019-03-14 NOTE — Progress Notes (Signed)
Sharon Webster presents today for injection per MD orders. B12 injection administered IM in left Upper Arm. Administration without incident. Patient tolerated well.  Donnald Tabar,cma

## 2019-03-22 ENCOUNTER — Ambulatory Visit (INDEPENDENT_AMBULATORY_CARE_PROVIDER_SITE_OTHER): Payer: Self-pay

## 2019-03-22 ENCOUNTER — Other Ambulatory Visit: Payer: Self-pay

## 2019-03-22 DIAGNOSIS — E538 Deficiency of other specified B group vitamins: Secondary | ICD-10-CM

## 2019-03-22 MED ORDER — CYANOCOBALAMIN 1000 MCG/ML IJ SOLN: 1000.0000 ug | Freq: Once | INTRAMUSCULAR | Status: DC

## 2019-03-22 MED ORDER — CYANOCOBALAMIN 1000 MCG/ML IJ SOLN
1000.0000 ug | Freq: Once | INTRAMUSCULAR | Status: AC
  Administered 2019-03-22: 16:00:00 1000 ug via INTRAMUSCULAR

## 2019-03-22 NOTE — Progress Notes (Signed)
Patient came in today for B-12 injection in right deltoid. Patient tolerated well.

## 2019-03-27 ENCOUNTER — Other Ambulatory Visit: Payer: Self-pay

## 2019-03-29 ENCOUNTER — Ambulatory Visit (INDEPENDENT_AMBULATORY_CARE_PROVIDER_SITE_OTHER): Payer: Medicare PPO | Admitting: *Deleted

## 2019-03-29 ENCOUNTER — Other Ambulatory Visit: Payer: Self-pay

## 2019-03-29 DIAGNOSIS — E538 Deficiency of other specified B group vitamins: Secondary | ICD-10-CM

## 2019-03-29 MED ORDER — CYANOCOBALAMIN 1000 MCG/ML IJ SOLN
1000.0000 ug | Freq: Once | INTRAMUSCULAR | Status: AC
  Administered 2019-03-29: 11:00:00 1000 ug via INTRAMUSCULAR

## 2019-03-29 NOTE — Progress Notes (Signed)
Pt was given B12 injection in left arm after drawing for Intrinsic Factor. Tolerated it well.

## 2019-04-02 LAB — INTRINSIC FACTOR ANTIBODIES: Intrinsic Factor: NEGATIVE

## 2019-04-09 ENCOUNTER — Telehealth: Payer: Self-pay | Admitting: Lab

## 2019-04-09 NOTE — Telephone Encounter (Signed)
Called Pt No answer, No VM to leave a message.

## 2019-04-09 NOTE — Progress Notes (Signed)
Called Pt No answer, No VM to leave a message.

## 2019-04-15 NOTE — Progress Notes (Signed)
  I have reviewed the above information and agree with above.   Valerie Cones, MD 

## 2019-04-19 ENCOUNTER — Encounter: Payer: Self-pay | Admitting: Internal Medicine

## 2019-05-18 ENCOUNTER — Other Ambulatory Visit: Payer: Self-pay

## 2019-05-18 MED ORDER — PRAVASTATIN SODIUM 20 MG PO TABS: ORAL_TABLET | ORAL | 1 refills | Status: DC

## 2019-05-18 MED ORDER — HYDROCHLOROTHIAZIDE 12.5 MG PO TABS: ORAL_TABLET | ORAL | 1 refills | Status: DC

## 2019-05-18 MED ORDER — PANTOPRAZOLE SODIUM 40 MG PO TBEC: DELAYED_RELEASE_TABLET | ORAL | 1 refills | Status: DC

## 2019-05-18 MED ORDER — DICYCLOMINE HCL 20 MG PO TABS: ORAL_TABLET | ORAL | 1 refills | Status: DC

## 2019-08-27 ENCOUNTER — Ambulatory Visit: Payer: Medicare Other | Admitting: Internal Medicine

## 2019-08-28 ENCOUNTER — Ambulatory Visit: Payer: Medicare PPO | Admitting: Internal Medicine

## 2019-09-07 ENCOUNTER — Encounter: Payer: Self-pay | Admitting: Internal Medicine

## 2019-09-07 ENCOUNTER — Other Ambulatory Visit: Payer: Self-pay

## 2019-09-07 ENCOUNTER — Ambulatory Visit: Payer: Medicare PPO | Admitting: Internal Medicine

## 2019-09-07 VITALS — BP 118/78 | HR 80 | Temp 98.6°F | Resp 16 | Ht 61.0 in | Wt 131.8 lb

## 2019-09-07 DIAGNOSIS — Z86010 Personal history of colonic polyps: Secondary | ICD-10-CM

## 2019-09-07 DIAGNOSIS — K760 Fatty (change of) liver, not elsewhere classified: Secondary | ICD-10-CM

## 2019-09-07 DIAGNOSIS — Z0000 Encounter for general adult medical examination without abnormal findings: Secondary | ICD-10-CM | POA: Diagnosis not present

## 2019-09-07 DIAGNOSIS — E7849 Other hyperlipidemia: Secondary | ICD-10-CM | POA: Diagnosis not present

## 2019-09-07 DIAGNOSIS — E559 Vitamin D deficiency, unspecified: Secondary | ICD-10-CM | POA: Diagnosis not present

## 2019-09-07 DIAGNOSIS — I10 Essential (primary) hypertension: Secondary | ICD-10-CM | POA: Diagnosis not present

## 2019-09-07 DIAGNOSIS — J449 Chronic obstructive pulmonary disease, unspecified: Secondary | ICD-10-CM | POA: Diagnosis not present

## 2019-09-07 MED ORDER — TETANUS-DIPHTH-ACELL PERTUSSIS 5-2-15.5 LF-MCG/0.5 IM SUSP: 0.5000 mL | Freq: Once | INTRAMUSCULAR | 0 refills | Status: AC

## 2019-09-07 NOTE — Progress Notes (Signed)
Patient ID: Sharon Webster, female    DOB: 1945/05/05  Age: 74 y.o. MRN: [id number]  The patient is here for annual  wellness examination and management of other chronic and acute problems.   The risk factors are reflected in the social history.  The roster of all physicians providing medical care to patient - is listed in the Snapshot section of the chart.  Activities of daily living:  The patient is 100% independent in all ADLs: dressing, toileting, feeding as well as independent mobility  Home safety : The patient has smoke detectors in the home. They wear seatbelts.  There are no firearms at home. There is no violence in the home.   There is no risks for hepatitis, STDs or HIV. There is no   history of blood transfusion. They have no travel history to infectious disease endemic areas of the world.  The patient has seen their dentist in the last six month. They have seen their eye doctor in the last year. They admit to slight hearing difficulty with regard to whispered voices and some television programs.  They have deferred audiologic testing in the last year.  They do not  have excessive sun exposure. Discussed the need for sun protection: hats, long sleeves and use of sunscreen if there is significant sun exposure.   Diet: the importance of a healthy diet is discussed. They do have a healthy diet.  The benefits of regular aerobic exercise were discussed. She walks 4 times per week ,  20 minutes.   Depression screen: there are no signs or vegative symptoms of depression- irritability, change in appetite, anhedonia, sadness/tearfullness.  Cognitive assessment: the patient manages all their financial and personal affairs and is actively engaged. They could relate day,date,year and events; recalled 2/3 objects at 3 minutes; performed clock-face test normally.  The following portions of the patient's history were reviewed and updated as appropriate: allergies, current medications, past  family history, past medical history,  past surgical history, past social history  and problem list.  Visual acuity was not assessed per patient preference since she has regular follow up with her ophthalmologist. Hearing and body mass index were assessed and reviewed.   During the course of the visit the patient was educated and counseled about appropriate screening and preventive services including : fall prevention , diabetes screening, nutrition counseling, colorectal cancer screening, and recommended immunizations.    CC: The primary encounter diagnosis was Vitamin D deficiency. Diagnoses of Familial hyperlipidemia, high LDL, Personal history of colonic polyps, Hepatic steatosis, Essential hypertension, Chronic obstructive pulmonary disease, unspecified COPD type ([place]), and Encounter for preventive health examination were also pertinent to this visit.     History Sharon Webster has a past medical history of Bowel trouble, Breast screening, unspecified, Colon polyp (2011), COPD (chronic obstructive pulmonary disease) ([place]), Diverticulosis, Duodenitis, GERD (gastroesophageal reflux disease), Glaucoma, Lump or mass in breast, Personal history of tobacco use, presenting hazards to health, and Special screening for malignant neoplasms, colon.   She has a past surgical history that includes Colonoscopy (08/20/2009); Breast biopsy (Right, 12/14/2011); and Colonoscopy with propofol (N/A, 04/02/2015).   Her family history includes Aneurysm in her brother; COPD in her mother; Cancer in her father; Heart disease in her mother and sister; Hypertension in her mother; Stroke in her sister.She reports that she quit smoking about 18 years ago. Her smoking use included cigarettes. She has a 20.00 pack-year smoking history. She has never used smokeless tobacco. She reports current alcohol use of about 3.0  standard drinks of alcohol per week. She reports that she does not use drugs.  Outpatient Medications Prior to  Visit  Medication Sig Dispense Refill  . cholecalciferol (VITAMIN D3) 25 MCG (1000 UT) tablet Take 1,000 Units by mouth daily.    Marland Kitchen dicyclomine (BENTYL) 20 MG tablet TAKE 1 TABLET BY MOUTH THREE TIMES DAILY BEFORE MEALS 270 tablet 1  . hydrochlorothiazide (HYDRODIURIL) 12.5 MG tablet TAKE 1 TABLET(12.5 MG) BY MOUTH DAILY 90 tablet 1  . pantoprazole (PROTONIX) 40 MG tablet TAKE 1 TABLET(40 MG) BY MOUTH DAILY 90 tablet 1  . pravastatin (PRAVACHOL) 20 MG tablet TAKE 1 TABLET(20 MG) BY MOUTH DAILY 90 tablet 1  . zinc gluconate 50 MG tablet Take 50 mg by mouth daily. (Patient not taking: Reported on 09/07/2019)     No facility-administered medications prior to visit.    Review of Systems   Patient denies headache, fevers, malaise, unintentional weight loss, skin rash, eye pain, sinus congestion and sinus pain, sore throat, dysphagia,  hemoptysis , cough, dyspnea, wheezing, chest pain, palpitations, orthopnea, edema, abdominal pain, nausea, melena, diarrhea, constipation, flank pain, dysuria, hematuria, urinary  Frequency, nocturia, numbness, tingling, seizures,  Focal weakness, Loss of consciousness,  Tremor, insomnia, depression, anxiety, and suicidal ideation.      Objective:  BP 118/78 (BP Location: Left Arm, Patient Position: Sitting, Cuff Size: Normal)   Pulse 80   Temp 98.6 F (37 C) (Temporal)   Resp 16   Ht 5\' 1"  (1.549 m)   Wt 131 lb 12.8 oz (59.8 kg)   SpO2 97%   BMI 24.90 kg/m   Physical Exam  General appearance: alert, cooperative and appears stated age Head: Normocephalic, without obvious abnormality, atraumatic Eyes: conjunctivae/corneas clear. PERRL, EOM's intact. Fundi benign. Ears: normal TM's and external ear canals both ears Nose: Nares normal. Septum midline. Mucosa normal. No drainage or sinus tenderness. Throat: lips, mucosa, and tongue normal; teeth and gums normal Neck: no adenopathy, no carotid bruit, no JVD, supple, symmetrical, trachea midline and thyroid  not enlarged, symmetric, no tenderness/mass/nodules Lungs: clear to auscultation bilaterally Breasts: normal appearance, no masses or tenderness Heart: regular rate and rhythm, S1, S2 normal, no murmur, click, rub or gallop Abdomen: soft, non-tender; bowel sounds normal; no masses,  no organomegaly Extremities: extremities normal, atraumatic, no cyanosis or edema Pulses: 2+ and symmetric Skin: Skin color, texture, turgor normal. No rashes or lesions Neurologic: Alert and oriented X 3, normal strength and tone. Normal symmetric reflexes. Normal coordination and gait.    Assessment & Plan:   Problem List Items Addressed This Visit      Unprioritized   Personal history of colonic polyps    Her  fasting glucose has never been  diagnostic of diabetes; but her  A1c continues to suggest that she is at t risk for developing type 2 Diabetes.  Continuing the excellent lifestyle changes that she has  made should delay the progression to diabetes ,       Hepatic steatosis    Continue pravastatin  .  Will consider adding metformin once a1c has been reviewed      Familial hyperlipidemia, high LDL   Relevant Orders   Comprehensive metabolic panel (Completed)   Lipid panel (Completed)   Essential hypertension    Well controlled on current regimen. Renal function stable, no changes today.  Lab Results  Component Value Date   CREATININE 0.86 09/07/2019   Lab Results  Component Value Date   NA 139 09/07/2019   K  3.8 09/07/2019   CL 101 09/07/2019   CO2 30 09/07/2019         Encounter for preventive health examination    age appropriate education and counseling updated, referrals for preventative services and immunizations addressed, dietary and smoking counseling addressed, most recent labs reviewed.  I have personally reviewed and have noted:  1) the patient's medical and social history 2) The pt's use of alcohol, tobacco, and illicit drugs 3) The patient's current medications and  supplements 4) Functional ability including ADL's, fall risk, home safety risk, hearing and visual impairment 5) Diet and physical activities 6) Evidence for depression or mood disorder 7) The patient's height, weight, and BMI have been recorded in the chart  I have made referrals, and provided counseling and education based on review of the above      COPD (chronic obstructive pulmonary disease) ([place])    She has been relatively asymptomatic,  At baseline for several months.  She is no longer smoking,        Other Visit Diagnoses    Vitamin D deficiency    -  Primary   Relevant Orders   VITAMIN D 25 Hydroxy (Vit-D Deficiency, Fractures) (Completed)      I have discontinued Benjamine Mola Clay's zinc gluconate. I am also having her start on Tdap. Additionally, I am having her maintain her cholecalciferol, hydrochlorothiazide, pantoprazole, dicyclomine, and pravastatin.  Meds ordered this encounter  Medications  . Tdap (ADACEL) 07-14-13.5 LF-MCG/0.5 injection    Sig: Inject 0.5 mLs into the muscle once for 1 dose.    Dispense:  0.5 mL    Refill:  0    Medications Discontinued During This Encounter  Medication Reason  . zinc gluconate 50 MG tablet Patient Preference    Follow-up: No follow-ups on file.   Crecencio Mc, MD

## 2019-09-07 NOTE — Patient Instructions (Addendum)
Your still need your tetanus-diptheria-pertussis vaccine (TDaP) but you can get it for less $$$ at a local pharmacy with the script I have provided you.

## 2019-09-08 LAB — COMPREHENSIVE METABOLIC PANEL
AG Ratio: 1.7 (calc) (ref 1.0–2.5)
ALT: 14 U/L (ref 6–29)
AST: 15 U/L (ref 10–35)
Albumin: 4.4 g/dL (ref 3.6–5.1)
Alkaline phosphatase (APISO): 83 U/L (ref 37–153)
BUN: 14 mg/dL (ref 7–25)
CO2: 30 mmol/L (ref 20–32)
Calcium: 9.8 mg/dL (ref 8.6–10.4)
Chloride: 101 mmol/L (ref 98–110)
Creat: 0.86 mg/dL (ref 0.60–0.93)
Globulin: 2.6 g/dL (calc) (ref 1.9–3.7)
Glucose, Bld: 102 mg/dL — ABNORMAL HIGH (ref 65–99)
Potassium: 3.8 mmol/L (ref 3.5–5.3)
Sodium: 139 mmol/L (ref 135–146)
Total Bilirubin: 0.5 mg/dL (ref 0.2–1.2)
Total Protein: 7 g/dL (ref 6.1–8.1)

## 2019-09-08 LAB — LIPID PANEL
Cholesterol: 191 mg/dL (ref <200)
HDL: 51 mg/dL (ref >=50)
LDL Cholesterol (Calc): 116 mg/dL (calc) — ABNORMAL HIGH
Non-HDL Cholesterol (Calc): 140 mg/dL (calc) — ABNORMAL HIGH (ref <130)
Total CHOL/HDL Ratio: 3.7 (calc) (ref <5.0)
Triglycerides: 128 mg/dL (ref <150)

## 2019-09-08 LAB — VITAMIN D 25 HYDROXY (VIT D DEFICIENCY, FRACTURES): Vit D, 25-Hydroxy: 44 ng/mL (ref 30–100)

## 2019-09-09 NOTE — Assessment & Plan Note (Signed)

## 2019-09-09 NOTE — Assessment & Plan Note (Signed)
Her  fasting glucose has never been  diagnostic of diabetes; but her  A1c continues to suggest that she is at t risk for developing type 2 Diabetes.  Continuing the excellent lifestyle changes that she has  made should delay the progression to diabetes ,

## 2019-09-09 NOTE — Assessment & Plan Note (Signed)
She has been relatively asymptomatic,  At baseline for several months.  She is no longer smoking,

## 2019-09-09 NOTE — Assessment & Plan Note (Signed)
Continue pravastatin  .  Will consider adding metformin once a1c has been reviewed

## 2019-09-09 NOTE — Assessment & Plan Note (Signed)
Well controlled on current regimen. Renal function stable, no changes today.  Lab Results  Component Value Date   CREATININE 0.86 09/07/2019   Lab Results  Component Value Date   NA 139 09/07/2019   K 3.8 09/07/2019   CL 101 09/07/2019   CO2 30 09/07/2019

## 2019-09-10 NOTE — Progress Notes (Signed)
Your vitamin D, cholesterol , liver and kidney function are normal.  You do not need any medication changes. Please continue your current medications and plan to repeat the labs in 6 months.    Regards,   Dr. Derrel Nip

## 2019-09-14 DIAGNOSIS — Z1231 Encounter for screening mammogram for malignant neoplasm of breast: Secondary | ICD-10-CM | POA: Diagnosis not present

## 2019-09-18 ENCOUNTER — Telehealth: Payer: Self-pay | Admitting: Internal Medicine

## 2019-09-18 DIAGNOSIS — R928 Other abnormal and inconclusive findings on diagnostic imaging of breast: Secondary | ICD-10-CM | POA: Insufficient documentation

## 2019-09-18 NOTE — Assessment & Plan Note (Signed)
[facility] has reported that Her mammogram was abnormal on the right  September 14 2019 .   She will be contacted to get additional films and an ultrasound the facility.

## 2019-09-18 NOTE — Telephone Encounter (Signed)
[facility] has reported that Her mammogram was abnormal on the right .   She will be contacted by [facility] to get additional films and an ultrasound the facility.

## 2019-09-19 NOTE — Telephone Encounter (Signed)
Spoke with pt to let her know of her mammogram results. Pt stated that her mammogram always comes back abnormal on the right side. She stated that it is because she had to have a placed removed for biopsy once before.

## 2019-10-17 DIAGNOSIS — R928 Other abnormal and inconclusive findings on diagnostic imaging of breast: Secondary | ICD-10-CM | POA: Diagnosis not present

## 2019-10-17 DIAGNOSIS — R922 Inconclusive mammogram: Secondary | ICD-10-CM | POA: Diagnosis not present

## 2019-10-17 LAB — HM MAMMOGRAPHY

## 2019-10-22 ENCOUNTER — Telehealth: Payer: Self-pay | Admitting: Internal Medicine

## 2019-11-12 ENCOUNTER — Other Ambulatory Visit: Payer: Self-pay | Admitting: Internal Medicine

## 2019-11-12 ENCOUNTER — Other Ambulatory Visit: Payer: Self-pay

## 2019-11-12 MED ORDER — PANTOPRAZOLE SODIUM 40 MG PO TBEC: DELAYED_RELEASE_TABLET | ORAL | 1 refills | Status: DC

## 2020-02-26 ENCOUNTER — Ambulatory Visit: Payer: Medicare Other

## 2020-03-03 ENCOUNTER — Ambulatory Visit (INDEPENDENT_AMBULATORY_CARE_PROVIDER_SITE_OTHER): Payer: Medicare PPO

## 2020-03-03 ENCOUNTER — Encounter (INDEPENDENT_AMBULATORY_CARE_PROVIDER_SITE_OTHER): Payer: Self-pay

## 2020-03-03 VITALS — Ht 61.0 in | Wt 131.0 lb

## 2020-03-03 DIAGNOSIS — Z0000 Encounter for general adult medical examination without abnormal findings: Secondary | ICD-10-CM | POA: Diagnosis not present

## 2020-03-03 NOTE — Patient Instructions (Addendum)
Sharon Webster , Thank you for taking time to come for your Medicare Wellness Visit. I appreciate your ongoing commitment to your health goals. Please review the following plan we discussed and let me know if I can assist you in the future.   These are the goals we discussed: Goals    . DIET - REDUCE SUGAR INTAKE    . Increase physical activity     Dance when possible Walk for exercise        This is a list of the screening recommended for you and due dates:  Health Maintenance  Topic Date Due  . Pneumonia vaccines (2 of 2 - PPSV23) 01/31/2016  . Flu Shot  10/14/2019  . COVID-19 Vaccine (3 - Booster for Pfizer series) 12/01/2019  . Tetanus Vaccine  01/07/2020  . Mammogram  10/16/2020  . Colon Cancer Screening  04/01/2025  . DEXA scan (bone density measurement)  Completed  .  Hepatitis C: One time screening is recommended by Center for Disease Control  (CDC) for  adults born from [birth year] through 1964.   Completed    Immunizations Immunization History  Administered Date(s) Administered  . Hep A / Hep B 07/31/2014, 09/03/2014, 02/04/2015  . Influenza Split 02/04/2014, 01/31/2015  . Influenza, High Dose Seasonal PF 01/08/2016  . Influenza,inj,Quad PF,6+ Mos 01/31/2015, 12/13/2016, 02/21/2018, 12/12/2018  . Influenza-Unspecified 02/12/2013, 12/30/2016  . PFIZER SARS-COV-2 Vaccination 05/10/2019, 05/31/2019  . Pneumococcal Conjugate-13 01/31/2015  . Tdap 01/06/2010   Keep all routine maintenance appointments.   Follow up 04/03/20 @ 10:30  Advanced directives: not yet completed  Conditions/risks identified: none new  Follow up in one year for your annual wellness visit    Preventive Care 65 Years and Older, Female Preventive care refers to lifestyle choices and visits with your health care provider that can promote health and wellness. What does preventive care include?  A yearly physical exam. This is also called an annual well check.  Dental exams once or twice a  year.  Routine eye exams. Ask your health care provider how often you should have your eyes checked.  Personal lifestyle choices, including:  Daily care of your teeth and gums.  Regular physical activity.  Eating a healthy diet.  Avoiding tobacco and drug use.  Limiting alcohol use.  Practicing safe sex.  Taking low-dose aspirin every day.  Taking vitamin and mineral supplements as recommended by your health care provider. What happens during an annual well check? The services and screenings done by your health care provider during your annual well check will depend on your age, overall health, lifestyle risk factors, and family history of disease. Counseling  Your health care provider may ask you questions about your:  Alcohol use.  Tobacco use.  Drug use.  Emotional well-being.  Home and relationship well-being.  Sexual activity.  Eating habits.  History of falls.  Memory and ability to understand (cognition).  Work and work Statistician.  Reproductive health. Screening  You may have the following tests or measurements:  Height, weight, and BMI.  Blood pressure.  Lipid and cholesterol levels. These may be checked every 5 years, or more frequently if you are over 35 years old.  Skin check.  Lung cancer screening. You may have this screening every year starting at age 17 if you have a 30-pack-year history of smoking and currently smoke or have quit within the past 15 years.  Fecal occult blood test (FOBT) of the stool. You may have this test every year starting  at age 54.  Flexible sigmoidoscopy or colonoscopy. You may have a sigmoidoscopy every 5 years or a colonoscopy every 10 years starting at age 70.  Hepatitis C blood test.  Hepatitis B blood test.  Sexually transmitted disease (STD) testing.  Diabetes screening. This is done by checking your blood sugar (glucose) after you have not eaten for a while (fasting). You may have this done every 1-3  years.  Bone density scan. This is done to screen for osteoporosis. You may have this done starting at age 76.  Mammogram. This may be done every 1-2 years. Talk to your health care provider about how often you should have regular mammograms. Talk with your health care provider about your test results, treatment options, and if necessary, the need for more tests. Vaccines  Your health care provider may recommend certain vaccines, such as:  Influenza vaccine. This is recommended every year.  Tetanus, diphtheria, and acellular pertussis (Tdap, Td) vaccine. You may need a Td booster every 10 years.  Zoster vaccine. You may need this after age 22.  Pneumococcal 13-valent conjugate (PCV13) vaccine. One dose is recommended after age 39.  Pneumococcal polysaccharide (PPSV23) vaccine. One dose is recommended after age 53. Talk to your health care provider about which screenings and vaccines you need and how often you need them. This information is not intended to replace advice given to you by your health care provider. Make sure you discuss any questions you have with your health care provider. Document Released: 03/28/2015 Document Revised: 11/19/2015 Document Reviewed: 12/31/2014 Elsevier Interactive Patient Education  2017 [employer] Prevention in the Home Falls can cause injuries. They can happen to people of all ages. There are many things you can do to make your home safe and to help prevent falls. What can I do on the outside of my home?  Regularly fix the edges of walkways and driveways and fix any cracks.  Remove anything that might make you trip as you walk through a door, such as a raised step or threshold.  Trim any bushes or trees on the path to your home.  Use bright outdoor lighting.  Clear any walking paths of anything that might make someone trip, such as rocks or tools.  Regularly check to see if handrails are loose or broken. Make sure that both sides of any  steps have handrails.  Any raised decks and porches should have guardrails on the edges.  Have any leaves, snow, or ice cleared regularly.  Use sand or salt on walking paths during winter.  Clean up any spills in your garage right away. This includes oil or grease spills. What can I do in the bathroom?  Use night lights.  Install grab bars by the toilet and in the tub and shower. Do not use towel bars as grab bars.  Use non-skid mats or decals in the tub or shower.  If you need to sit down in the shower, use a plastic, non-slip stool.  Keep the floor dry. Clean up any water that spills on the floor as soon as it happens.  Remove soap buildup in the tub or shower regularly.  Attach bath mats securely with double-sided non-slip rug tape.  Do not have throw rugs and other things on the floor that can make you trip. What can I do in the bedroom?  Use night lights.  Make sure that you have a light by your bed that is easy to reach.  Do not use  any sheets or blankets that are too big for your bed. They should not hang down onto the floor.  Have a firm chair that has side arms. You can use this for support while you get dressed.  Do not have throw rugs and other things on the floor that can make you trip. What can I do in the kitchen?  Clean up any spills right away.  Avoid walking on wet floors.  Keep items that you use a lot in easy-to-reach places.  If you need to reach something above you, use a strong step stool that has a grab bar.  Keep electrical cords out of the way.  Do not use floor polish or wax that makes floors slippery. If you must use wax, use non-skid floor wax.  Do not have throw rugs and other things on the floor that can make you trip. What can I do with my stairs?  Do not leave any items on the stairs.  Make sure that there are handrails on both sides of the stairs and use them. Fix handrails that are broken or loose. Make sure that handrails are  as long as the stairways.  Check any carpeting to make sure that it is firmly attached to the stairs. Fix any carpet that is loose or worn.  Avoid having throw rugs at the top or bottom of the stairs. If you do have throw rugs, attach them to the floor with carpet tape.  Make sure that you have a light switch at the top of the stairs and the bottom of the stairs. If you do not have them, ask someone to add them for you. What else can I do to help prevent falls?  Wear shoes that:  Do not have high heels.  Have rubber bottoms.  Are comfortable and fit you well.  Are closed at the toe. Do not wear sandals.  If you use a stepladder:  Make sure that it is fully opened. Do not climb a closed stepladder.  Make sure that both sides of the stepladder are locked into place.  Ask someone to hold it for you, if possible.  Clearly mark and make sure that you can see:  Any grab bars or handrails.  First and last steps.  Where the edge of each step is.  Use tools that help you move around (mobility aids) if they are needed. These include:  Canes.  Walkers.  Scooters.  Crutches.  Turn on the lights when you go into a dark area. Replace any light bulbs as soon as they burn out.  Set up your furniture so you have a clear path. Avoid moving your furniture around.  If any of your floors are uneven, fix them.  If there are any pets around you, be aware of where they are.  Review your medicines with your doctor. Some medicines can make you feel dizzy. This can increase your chance of falling. Ask your doctor what other things that you can do to help prevent falls. This information is not intended to replace advice given to you by your health care provider. Make sure you discuss any questions you have with your health care provider. Document Released: 12/26/2008 Document Revised: 08/07/2015 Document Reviewed: 04/05/2014 Elsevier Interactive Patient Education  2017 [employer].

## 2020-03-03 NOTE — Progress Notes (Addendum)
Subjective:   Sharon Webster is a 74 y.o. female who presents for Medicare Annual (Subsequent) preventive examination.  Review of Systems    No ROS.  Medicare Wellness Virtual Visit.   Cardiac Risk Factors include: advanced age (>19men, >23 women)     Objective:    Today's Vitals   03/03/20 1139  Weight: 131 lb (59.4 kg)  Height: 5\' 1"  (1.549 m)   Body mass index is 24.75 kg/m.  Advanced Directives 03/03/2020 02/21/2018 02/17/2017 02/18/2016 02/18/2015  Does Patient Have a Medical Advance Directive? No No No No Yes  Type of Advance Directive - - - - Living will;Healthcare Power of Attorney  Does patient want to make changes to medical advance directive? - No - Patient declined - - No - Patient declined  Copy of [place] in Chart? - - - - No - copy requested  Would patient like information on creating a medical advance directive? No - Patient declined - No - Patient declined No - Patient declined -    Current Medications (verified) Outpatient Encounter Medications as of 03/03/2020  Medication Sig   cholecalciferol (VITAMIN D3) 25 MCG (1000 UT) tablet Take 1,000 Units by mouth daily.   dicyclomine (BENTYL) 20 MG tablet TAKE 1 TABLET BY MOUTH THREE TIMES DAILY BEFORE MEALS   hydrochlorothiazide (HYDRODIURIL) 12.5 MG tablet TAKE 1 TABLET(12.5 MG) BY MOUTH DAILY   pantoprazole (PROTONIX) 40 MG tablet TAKE 1 TABLET(40 MG) BY MOUTH DAILY   pravastatin (PRAVACHOL) 20 MG tablet TAKE 1 TABLET(20 MG) BY MOUTH DAILY   No facility-administered encounter medications on file as of 03/03/2020.    Allergies (verified) Patient has no known allergies.   History: Past Medical History:  Diagnosis Date   Bowel trouble    Breast screening, unspecified    atypical ductal hyperplasia, declined excision.   Colon polyp 2011   COPD (chronic obstructive pulmonary disease) (HCC)    Diverticulosis    Duodenitis    GERD (gastroesophageal reflux disease)    Glaucoma     Lump or mass in breast    Personal history of tobacco use, presenting hazards to health    Special screening for malignant neoplasms, colon    Past Surgical History:  Procedure Laterality Date   BREAST BIOPSY Right 12/14/2011   3:00, [facility]se biopsy,December 14, 2011. This was completed as her FNA showed atypia, papilloma, "features" of  ADH  identified. Declined formal excision. I elected not to offer chemoprevention without formal excision. .(January 13, 2012.)    COLONOSCOPY  08/20/2009   Tubular adenoma x2. Transverse and descending colon Dr. Dionne Webster   COLONOSCOPY WITH PROPOFOL N/A 04/02/2015   Tubular adenoma. F/U 2022.  Surgeon: Sharon Bellow, MD;  Location: [facility];  Service: Endoscopy;  Laterality: N/A;   Family History  Problem Relation Age of Onset   Heart disease Mother    COPD Mother    Hypertension Mother    Cancer Father        metastatic prostate CA   Stroke Sister        hemorrhagic CVA   Heart disease Sister        restrictive cardiomyopathy   Aneurysm Brother    Social History   Socioeconomic History   Marital status: Widowed    Spouse name: Not on file   Number of children: Not on file   Years of education: Not on file   Highest education level: Not on file  Occupational History   Not on  file  Tobacco Use   Smoking status: Former Smoker    Packs/day: 1.00    Years: 20.00    Pack years: 20.00    Types: Cigarettes    Quit date: 05/15/2001    Years since quitting: 18.8   Smokeless tobacco: Never Used  Vaping Use   Vaping Use: Never used  Substance and Sexual Activity   Alcohol use: Yes    Alcohol/week: 3.0 standard drinks    Types: 3 Cans of beer per week   Drug use: No   Sexual activity: Not Currently  Other Topics Concern   Not on file  Social History Narrative   Not on file   Social Determinants of Health   Financial Resource Strain: Low Risk    Difficulty of Paying Living Expenses: Not hard at all  Food Insecurity: No Food  Insecurity   Worried About Charity fundraiser in the Last Year: Never true   [place] in the Last Year: Never true  Transportation Needs: No Transportation Needs   Lack of Transportation (Medical): No   Lack of Transportation (Non-Medical): No  Physical Activity: Not on file  Stress: No Stress Concern Present   Feeling of Stress : Not at all  Social Connections: Unknown   Frequency of Communication with Friends and Family: More than three times a week   Frequency of Social Gatherings with Friends and Family: More than three times a week   Attends Religious Services: Not on Electrical engineer or Organizations: Not on file   Attends Archivist Meetings: Not on file   Marital Status: Not on file    Tobacco Counseling Counseling given: Not Answered   Clinical Intake:  Pre-visit preparation completed: Yes        Diabetes: No  How often do you need to have someone help you when you read instructions, pamphlets, or other written materials from your doctor or pharmacy?: 1 - Never  Interpreter Needed?: No      Activities of Daily Living In your present state of health, do you have any difficulty performing the following activities: 03/03/2020  Hearing? N  Vision? N  Difficulty concentrating or making decisions? N  Walking or climbing stairs? N  Dressing or bathing? N  Doing errands, shopping? N  Preparing Food and eating ? N  Using the Toilet? N  In the past six months, have you accidently leaked urine? N  Do you have problems with loss of bowel control? N  Managing your Medications? N  Managing your Finances? N  Housekeeping or managing your Housekeeping? N  Some recent data might be hidden    Patient Care Team: Sharon Mc, MD as PCP - General (Internal Medicine) Sharon Webster, Sharon Gleason, MD (General Surgery) Sharon Mc, MD (Internal Medicine)  Indicate any recent Medical Services you may have received from other than [facility]  providers in the past year (date may be approximate).     Assessment:   This is a routine wellness examination for Sharon Webster.  I connected with Sharon Webster today by telephone and verified that I am speaking with the correct person using two identifiers. Location patient: home Location provider: work Persons participating in the virtual visit: patient, Marine scientist.    I discussed the limitations, risks, security and privacy concerns of performing an evaluation and management service by telephone and the availability of in person appointments. The patient expressed understanding and verbally consented to this telephonic visit.  Interactive audio and video telecommunications were attempted between this provider and patient, however failed, due to patient having technical difficulties OR patient did not have access to video capability.  We continued and completed visit with audio only.  Some vital signs may be absent or patient reported.   Hearing/Vision screen  Hearing Screening   125Hz  250Hz  500Hz  1000Hz  2000Hz  3000Hz  4000Hz  6000Hz  8000Hz   Right ear:           Left ear:           Comments: Patient is able to hear conversational tones without difficulty.  No issues reported.  Vision Screening Comments: Wears corrective lenses  Visual acuity not assessed, virtual visit. They have seen their ophthalmologist in the last 12 months.    Dietary issues and exercise activities discussed: Current Exercise Habits: Home exercise routine, Intensity: Mild Regular diet Good water intake Goals      DIET - REDUCE SUGAR INTAKE     Increase physical activity     Dance when possible Walk for exercise        Depression Screen PHQ 2/9 Scores 03/03/2020 02/23/2019 02/21/2018 02/17/2017 02/18/2016 02/18/2015  PHQ - 2 Score 0 0 0 1 0 0  PHQ- 9 Score - - - 2 - -    Fall Risk Fall Risk  03/03/2020 09/07/2019 02/26/2019 02/23/2019 02/21/2018  Falls in the past year? 0 0 0 0 0  Number falls in past yr: 0 -  - - -  Injury with Fall? 0 - - - -  Follow up Falls evaluation completed Falls evaluation completed Falls evaluation completed Falls prevention discussed -  Comment - - - - -    FALL RISK PREVENTION PERTAINING TO THE HOME: Handrails in use when climbing stairs? Yes Home free of loose throw rugs in walkways, pet beds, electrical cords, etc? Yes  Adequate lighting in your home to reduce risk of falls? Yes   ASSISTIVE DEVICES UTILIZED TO PREVENT FALLS: Life alert? No  Use of a cane, walker or w/c? No  Grab bars in the bathroom? No  Shower chair or bench in shower? No  Elevated toilet seat or a handicapped toilet? No   TIMED UP AND GO: Was the test performed? No . Virtual visit.   Cognitive Function: MMSE - Mini Mental State Exam 02/17/2017 02/18/2016 02/18/2015  Orientation to time 5 5 5   Orientation to Place 5 5 5   Registration 3 3 3   Attention/ Calculation 5 5 5   Recall 3 3 3   Language- name 2 objects 2 2 2   Language- repeat 1 1 1   Language- follow 3 step command 3 3 3   Language- read & follow direction 1 1 1   Write a sentence 1 1 1   Copy design 1 1 1   Total score 30 30 30      6CIT Screen 03/03/2020 02/23/2019 02/21/2018  What Year? 0 points 0 points 0 points  What month? 0 points 0 points 0 points  What time? 0 points 0 points 0 points  Count back from 20 0 points 0 points 0 points  Months in reverse 0 points 0 points 0 points  Repeat phrase 0 points 0 points 0 points  Total Score 0 0 0    Immunizations Immunization History  Administered Date(s) Administered   Hep A / Hep B 07/31/2014, 09/03/2014, 02/04/2015   Influenza Split 02/04/2014, 01/31/2015   Influenza, High Dose Seasonal PF 01/08/2016   Influenza,inj,Quad PF,6+ Mos 01/31/2015, 12/13/2016, 02/21/2018, 12/12/2018   Influenza-Unspecified 02/12/2013,  12/30/2016   PFIZER SARS-COV-2 Vaccination 05/10/2019, 05/31/2019   Pneumococcal Conjugate-13 01/31/2015   Tdap 01/06/2010   Health Maintenance Health  Maintenance  Topic Date Due   PNA vac Low Risk Adult (2 of 2 - PPSV23) 01/31/2016   INFLUENZA VACCINE  10/14/2019   COVID-19 Vaccine (3 - Booster for Pfizer series) 12/01/2019   TETANUS/TDAP  01/07/2020   MAMMOGRAM  10/16/2020   COLONOSCOPY  04/01/2025   DEXA SCAN  Completed   Hepatitis C Screening  Completed   Colorectal cancer screening: Type of screening: Colonoscopy. Completed 04/02/15. Repeat every 10 years  Mammogram status: Completed 10/17/19. Repeat every year.  Bone Density- 04/27/16. Osteopenia. cholecalciferol (VITAMIN D3) 25 MCG (1000 UT) tablet  Lung Cancer Screening: (Low Dose CT Chest recommended if Age 44-80 years, 30 pack-year currently smoking OR have quit w/in 15years.) does not qualify.   Hepatitis C Screening: Completed 01/31/15.  Vision Screening: Recommended annual ophthalmology exams for early detection of glaucoma and other disorders of the eye. Is the patient up to date with their annual eye exam?  Yes   Dental Screening: Recommended annual dental exams for proper oral hygiene. Partials.   Community Resource Referral / Chronic Care Management: CRR required this visit?  No   CCM required this visit?  No      Plan:   Keep all routine maintenance appointments.   Follow up 04/03/20 @ 10:30  I have personally reviewed and noted the following in the patient's chart:   Medical and social history Use of alcohol, tobacco or illicit drugs  Current medications and supplements Functional ability and status Nutritional status Physical activity Advanced directives List of other physicians Hospitalizations, surgeries, and ER visits in previous 12 months Vitals Screenings to include cognitive, depression, and falls Referrals and appointments  In addition, I have reviewed and discussed with patient certain preventive protocols, quality metrics, and best practice recommendations. A written personalized care plan for preventive services as well as general  preventive health recommendations were provided to patient via mail.     OBrien-Blaney, Georgette Helmer L, LPN   [id number]    I have reviewed the above information and agree with above.   Deborra Medina, MD

## 2020-04-03 ENCOUNTER — Telehealth (INDEPENDENT_AMBULATORY_CARE_PROVIDER_SITE_OTHER): Payer: Medicare PPO | Admitting: Internal Medicine

## 2020-04-03 ENCOUNTER — Encounter: Payer: Self-pay | Admitting: Internal Medicine

## 2020-04-03 ENCOUNTER — Telehealth: Payer: Self-pay | Admitting: Internal Medicine

## 2020-04-03 VITALS — Temp 98.1°F | Ht 61.0 in | Wt 131.0 lb

## 2020-04-03 DIAGNOSIS — I10 Essential (primary) hypertension: Secondary | ICD-10-CM

## 2020-04-03 DIAGNOSIS — J449 Chronic obstructive pulmonary disease, unspecified: Secondary | ICD-10-CM | POA: Diagnosis not present

## 2020-04-03 DIAGNOSIS — K760 Fatty (change of) liver, not elsewhere classified: Secondary | ICD-10-CM | POA: Diagnosis not present

## 2020-04-03 DIAGNOSIS — E538 Deficiency of other specified B group vitamins: Secondary | ICD-10-CM | POA: Diagnosis not present

## 2020-04-03 DIAGNOSIS — E7849 Other hyperlipidemia: Secondary | ICD-10-CM

## 2020-04-03 DIAGNOSIS — Z20822 Contact with and (suspected) exposure to covid-19: Secondary | ICD-10-CM | POA: Diagnosis not present

## 2020-04-03 DIAGNOSIS — D126 Benign neoplasm of colon, unspecified: Secondary | ICD-10-CM

## 2020-04-03 NOTE — Telephone Encounter (Signed)
Patient has no voice mail. Tried to schedule a 2 week fasting lab.

## 2020-04-03 NOTE — Assessment & Plan Note (Signed)
Removed in 2017.  Referral needed to Dr Doreatha Lew. Or another in network  Provider
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
## 2020-04-05 DIAGNOSIS — Z20822 Contact with and (suspected) exposure to covid-19: Secondary | ICD-10-CM | POA: Insufficient documentation

## 2020-04-05 NOTE — Assessment & Plan Note (Signed)
;    She is taking pravastatin.  Her LDL had risen to 116 in June.  Goal is LDL < 100.  Will change to higher potency statin if recheck is high  Lab Results  Component Value Date   CHOL 191 09/07/2019   HDL 51 09/07/2019   LDLCALC 116 (H) 09/07/2019   LDLDIRECT 122.0 01/07/2016   TRIG 128 09/07/2019   CHOLHDL 3.7 09/07/2019

## 2020-04-05 NOTE — Assessment & Plan Note (Addendum)
She has been relatively asymptomatic,  At baseline for a year ,. Even with current URI.  She is no longer smoking,

## 2020-04-05 NOTE — Assessment & Plan Note (Signed)
SHE has mild symptoms and is not interested in being tested.  She is fully vaccinated.  Supportive care outllined

## 2020-04-05 NOTE — Assessment & Plan Note (Addendum)
Well controlled on current regimen of HCTZ 25 mg daily.  She is due for assessment of lytes and renal function .   Lab Results  Component Value Date   CREATININE 0.86 09/07/2019   Lab Results  Component Value Date   NA 139 09/07/2019   K 3.8 09/07/2019   CL 101 09/07/2019   CO2 30 09/07/2019

## 2020-04-15 ENCOUNTER — Other Ambulatory Visit (INDEPENDENT_AMBULATORY_CARE_PROVIDER_SITE_OTHER): Payer: Medicare PPO

## 2020-04-15 ENCOUNTER — Other Ambulatory Visit: Payer: Self-pay

## 2020-04-15 DIAGNOSIS — K760 Fatty (change of) liver, not elsewhere classified: Secondary | ICD-10-CM

## 2020-04-15 DIAGNOSIS — E538 Deficiency of other specified B group vitamins: Secondary | ICD-10-CM | POA: Diagnosis not present

## 2020-04-15 DIAGNOSIS — E7849 Other hyperlipidemia: Secondary | ICD-10-CM

## 2020-04-15 LAB — LIPID PANEL
Cholesterol: 183 mg/dL (ref 0–200)
HDL: 49.9 mg/dL (ref >39.00)
LDL Cholesterol: 104 mg/dL — ABNORMAL HIGH (ref 0–99)
NonHDL: 133
Total CHOL/HDL Ratio: 4
Triglycerides: 147 mg/dL (ref 0.0–149.0)
VLDL: 29.4 mg/dL (ref 0.0–40.0)

## 2020-04-15 LAB — COMPREHENSIVE METABOLIC PANEL
ALT: 9 U/L (ref 0–35)
AST: 13 U/L (ref 0–37)
Albumin: 4.4 g/dL (ref 3.5–5.2)
Alkaline Phosphatase: 85 U/L (ref 39–117)
BUN: 17 mg/dL (ref 6–23)
CO2: 31 mEq/L (ref 19–32)
Calcium: 9.7 mg/dL (ref 8.4–10.5)
Chloride: 101 mEq/L (ref 96–112)
Creatinine, Ser: 0.83 mg/dL (ref 0.40–1.20)
GFR: 69.81 mL/min (ref >60.00)
Glucose, Bld: 113 mg/dL — ABNORMAL HIGH (ref 70–99)
Potassium: 3.8 mEq/L (ref 3.5–5.1)
Sodium: 138 mEq/L (ref 135–145)
Total Bilirubin: 0.5 mg/dL (ref 0.2–1.2)
Total Protein: 7 g/dL (ref 6.0–8.3)

## 2020-04-15 LAB — VITAMIN B12: Vitamin B-12: >1526 pg/mL — ABNORMAL HIGH (ref 211–911)

## 2020-04-16 ENCOUNTER — Other Ambulatory Visit: Payer: Self-pay | Admitting: Internal Medicine

## 2020-04-16 DIAGNOSIS — I10 Essential (primary) hypertension: Secondary | ICD-10-CM

## 2020-04-16 DIAGNOSIS — R7301 Impaired fasting glucose: Secondary | ICD-10-CM

## 2020-04-16 DIAGNOSIS — E7849 Other hyperlipidemia: Secondary | ICD-10-CM

## 2020-04-16 NOTE — Progress Notes (Signed)
Your B12  cholesterol,  liver and kidney function are normal.fasting glucose was elevated but not diagnostic of diabetes.  Continue current medications and  Plan to repeat fasting labs in  6 months along with a1c to  screen for diabetes    Regards,  Dr. Derrel Nip

## 2020-04-24 ENCOUNTER — Other Ambulatory Visit: Payer: Self-pay | Admitting: General Surgery

## 2020-04-24 DIAGNOSIS — Z86010 Personal history of colonic polyps: Secondary | ICD-10-CM | POA: Diagnosis not present

## 2020-04-24 DIAGNOSIS — M25462 Effusion, left knee: Secondary | ICD-10-CM | POA: Diagnosis not present

## 2020-04-24 NOTE — Progress Notes (Signed)
Subjective:     Patient ID: Sharon Webster is a 75 y.o. female.  HPI  The following portions of the patient's history were reviewed and updated as appropriate.  This a new patient is here today for: office visit. The patient has been referred by Dr. Derrel Nip for evaluation of a colonoscopy. She states her bowels move every other day, no bleeding.      Chief Complaint  Patient presents with  . Follow-up     BP (!) 144/64   Pulse 96   Temp 36.8 C (98.2 F)   Ht 162.6 cm (5\' 4" )   Wt 59.9 kg (132 lb)   SpO2 97%   BMI 22.66 kg/m       Past Medical History:  Diagnosis Date  . Colon polyp 2011  . COPD (chronic obstructive pulmonary disease) ([facility])   . Diverticulosis   . Duodenitis   . GERD (gastroesophageal reflux disease)   . Glaucoma   . Personal history of tobacco use           Past Surgical History:  Procedure Laterality Date  . BREAST EXCISIONAL BIOPSY Right 12/14/2011  . COLONOSCOPY  04/02/2015   5 mm tubular adenoma of the cecum.  . COLONOSCOPY  08/20/2009              OB History    Gravida  0   Para  0   Term  0   Preterm  0   AB  0   Living  0     SAB  0   IAB  0   Ectopic  0   Molar  0   Multiple  0   Live Births  0       Obstetric Comments  Age at first period 70          Social History          Socioeconomic History  . Marital status: Married    Spouse name: Not on file  . Number of children: Not on file  . Years of education: Not on file  . Highest education level: Not on file  Occupational History  . Not on file  Tobacco Use  . Smoking status: Former Smoker    Packs/day: 1.00    Years: 20.00    Pack years: 20.00    Quit date: 05/15/2001    Years since quitting: 18.9  . Smokeless tobacco: Never Used  Substance and Sexual Activity  . Alcohol use: Not on file  . Drug use: Not on file  . Sexual activity: Not on file  Other Topics Concern  . Not on file   Social History Narrative  . Not on file   Social Determinants of Health   Financial Resource Strain: Not on file  Food Insecurity: Not on file  Transportation Needs: Not on file       No Known Allergies  Current Medications        Current Outpatient Medications  Medication Sig Dispense Refill  . dicyclomine (BENTYL) 20 mg tablet as directed    . hydroCHLOROthiazide (HYDRODIURIL) 12.5 MG tablet once daily    . pantoprazole (PROTONIX) 40 MG DR tablet Take 40 mg by mouth once daily    . pravastatin (PRAVACHOL) 20 MG tablet Take 20 mg by mouth once daily    . bisacodyL (DULCOLAX) 5 mg EC tablet Take two tablets morning and two tablets afternoon day prior to Miralax prep. 4 tablet 0  . cholecalciferol (VITAMIN  D3) 1000 unit tablet Take by mouth once daily    . polyethylene glycol (MIRALAX) powder One bottle for colonoscopy prep. Use as directed. 255 g 0   No current facility-administered medications for this visit.           Family History  Problem Relation Age of Onset  . High blood pressure (Hypertension) Mother   . Heart disease Mother   . COPD Mother   . Prostate cancer Father   . Stroke Sister   . Heart disease Sister   . Aneurysm Brother      Review of Systems  Constitutional: Negative for chills and fever.  Respiratory: Negative for cough.   Gastrointestinal: Negative for blood in stool.       Objective:   Physical Exam Exam conducted with a chaperone present.  Constitutional:      Appearance: Normal appearance.  Cardiovascular:     Rate and Rhythm: Normal rate and regular rhythm.     Pulses: Normal pulses.     Heart sounds: Normal heart sounds.  Pulmonary:     Effort: Pulmonary effort is normal.     Breath sounds: Normal breath sounds.  Abdominal:     Palpations: Abdomen is soft.     Hernia: A hernia is present. Hernia is present in the umbilical area.  Musculoskeletal:     Cervical back: Neck supple.       Legs:      Comments: Mild-moderate effusion involving the left knee, without focal tenderness.  Skin:    General: Skin is warm and dry.  Neurological:     Mental Status: She is alert and oriented to person, place, and time.  Psychiatric:        Mood and Affect: Mood normal.        Behavior: Behavior normal.    Labs and Radiology:   January 18, twenty seventeen colonoscopy report and images reviewed.  Pathology reviewed.  5 mm tubular adenoma of the cecum.       Assessment:     Small tubular adenoma of the cecum at the time of two thousand seventeen exam.    Plan:     At the time of her procedure 5-year follow-ups were routine.  Recent data suggest this can be stretched to 7 to 10 years.  Based on her age we reviewed whether this should be completed now or in 2 years.  In general it seems reasonable to go ahead and clear the colon now and if normal may release the patient from additional studies.  The patient's knee swelling is likely related to unrecognized mild trauma and should resolve with conservative measures. Recommend taking 2 Aleve twice a day for 10 days for left knee discomfort.     Patient to follow up as scheduled and is aware to call for any new issues or concerns.   Patient to be scheduled for a colonoscopy on 05-07-20 at [facility].   Entered by Ledell Noss, CMA, acting as a scribe for Dr. Hervey Ard, MD.  Entered by Karie Fetch, RN, acting as a scribe for Dr. Hervey Ard, MD.   The documentation recorded by the scribe accurately reflects the service I personally performed and the decisions made by me.   Robert Bellow, MD FACS

## 2020-05-05 ENCOUNTER — Other Ambulatory Visit: Payer: Self-pay

## 2020-05-05 ENCOUNTER — Other Ambulatory Visit
Admission: RE | Admit: 2020-05-05 | Discharge: 2020-05-05 | Disposition: A | Discharge status: Home / Self Care | Payer: Medicare PPO | Source: Ambulatory Visit | Attending: General Surgery | Admitting: General Surgery

## 2020-05-05 DIAGNOSIS — Z20822 Contact with and (suspected) exposure to covid-19: Secondary | ICD-10-CM | POA: Diagnosis not present

## 2020-05-05 DIAGNOSIS — Z01812 Encounter for preprocedural laboratory examination: Secondary | ICD-10-CM | POA: Insufficient documentation

## 2020-05-05 LAB — SARS CORONAVIRUS 2 (TAT 6-24 HRS): SARS Coronavirus 2: NEGATIVE

## 2020-05-06 ENCOUNTER — Encounter: Payer: Self-pay | Admitting: General Surgery

## 2020-05-07 ENCOUNTER — Ambulatory Visit: Payer: Medicare PPO | Admitting: Registered Nurse

## 2020-05-07 ENCOUNTER — Other Ambulatory Visit: Payer: Self-pay

## 2020-05-07 ENCOUNTER — Ambulatory Visit
Admission: RE | Admit: 2020-05-07 | Discharge: 2020-05-07 | Disposition: A | Discharge status: Home / Self Care | Payer: Medicare PPO | Attending: General Surgery | Admitting: General Surgery

## 2020-05-07 ENCOUNTER — Encounter
Admission: RE | Disposition: A | Discharge status: Home / Self Care | Payer: Self-pay | Source: Home / Self Care | Attending: General Surgery

## 2020-05-07 ENCOUNTER — Encounter: Payer: Self-pay | Admitting: General Surgery

## 2020-05-07 DIAGNOSIS — K5790 Diverticulosis of intestine, part unspecified, without perforation or abscess without bleeding: Secondary | ICD-10-CM | POA: Diagnosis not present

## 2020-05-07 DIAGNOSIS — K5730 Diverticulosis of large intestine without perforation or abscess without bleeding: Secondary | ICD-10-CM | POA: Diagnosis not present

## 2020-05-07 DIAGNOSIS — Z87891 Personal history of nicotine dependence: Secondary | ICD-10-CM | POA: Diagnosis not present

## 2020-05-07 DIAGNOSIS — Z1211 Encounter for screening for malignant neoplasm of colon: Secondary | ICD-10-CM | POA: Insufficient documentation

## 2020-05-07 DIAGNOSIS — K219 Gastro-esophageal reflux disease without esophagitis: Secondary | ICD-10-CM | POA: Insufficient documentation

## 2020-05-07 DIAGNOSIS — Z86010 Personal history of colonic polyps: Secondary | ICD-10-CM | POA: Insufficient documentation

## 2020-05-07 DIAGNOSIS — K635 Polyp of colon: Secondary | ICD-10-CM | POA: Diagnosis not present

## 2020-05-07 DIAGNOSIS — D122 Benign neoplasm of ascending colon: Secondary | ICD-10-CM | POA: Insufficient documentation

## 2020-05-07 DIAGNOSIS — J449 Chronic obstructive pulmonary disease, unspecified: Secondary | ICD-10-CM | POA: Insufficient documentation

## 2020-05-07 HISTORY — PX: COLONOSCOPY WITH PROPOFOL: SHX5780

## 2020-05-07 SURGERY — COLONOSCOPY WITH PROPOFOL
Anesthesia: General

## 2020-05-07 MED ORDER — PROPOFOL 500 MG/50ML IV EMUL
INTRAVENOUS | Status: DC | PRN
  Administered 2020-05-07: 150 ug/kg/min via INTRAVENOUS

## 2020-05-07 MED ORDER — PROPOFOL 10 MG/ML IV BOLUS
INTRAVENOUS | Status: DC | PRN
  Administered 2020-05-07: 60 mg via INTRAVENOUS
  Administered 2020-05-07 (×2): 20 mg via INTRAVENOUS

## 2020-05-07 MED ORDER — SODIUM CHLORIDE 0.9 % IV SOLN: INTRAVENOUS | Status: DC

## 2020-05-07 NOTE — H&P (Signed)
Sharon Webster [id number] [birth year]     HPI:  Healthy 75 y/o with prior history small tubular adenoma of the ascending colon. For repeast exam.   No medications prior to admission.   No Known Allergies Past Medical History:  Diagnosis Date  . Bowel trouble   . Breast screening, unspecified    atypical ductal hyperplasia, declined excision.  . Colon polyp 2011  . COPD (chronic obstructive pulmonary disease) ([place])   . Diverticulosis   . Duodenitis   . GERD (gastroesophageal reflux disease)   . Glaucoma   . Lump or mass in breast   . Personal history of tobacco use, presenting hazards to health   . Special screening for malignant neoplasms, colon    Past Surgical History:  Procedure Laterality Date  . BREAST BIOPSY Right 12/14/2011   3:00, Finesse biopsy,December 14, 2011. This was completed as her FNA showed atypia, papilloma, "features" of  ADH  identified. Declined formal excision. I elected not to offer chemoprevention without formal excision. .(January 13, 2012.)   . COLONOSCOPY  08/20/2009   Tubular adenoma x2. Transverse and descending colon Dr. Dionne Milo  . COLONOSCOPY WITH PROPOFOL N/A 04/02/2015   Tubular adenoma. F/U 2022.  Surgeon: Robert Bellow, MD;  Location: [facility] ENDOSCOPY;  Service: Endoscopy;  Laterality: N/A;   Social History   Socioeconomic History  . Marital status: Widowed    Spouse name: Not on file  . Number of children: Not on file  . Years of education: Not on file  . Highest education level: Not on file  Occupational History  . Not on file  Tobacco Use  . Smoking status: Former Smoker    Packs/day: 1.00    Years: 20.00    Pack years: 20.00    Types: Cigarettes    Quit date: 05/15/2001    Years since quitting: 18.9  . Smokeless tobacco: Never Used  Vaping Use  . Vaping Use: Never used  Substance and Sexual Activity  . Alcohol use: Yes    Alcohol/week: 3.0 standard drinks    Types: 3 Cans of beer per week  . Drug use: No  . Sexual  activity: Not Currently  Other Topics Concern  . Not on file  Social History Narrative  . Not on file   Social Determinants of Health   Financial Resource Strain: Low Risk   . Difficulty of Paying Living Expenses: Not hard at all  Food Insecurity: No Food Insecurity  . Worried About Charity fundraiser in the Last Year: Never true  . Ran Out of Food in the Last Year: Never true  Transportation Needs: No Transportation Needs  . Lack of Transportation (Medical): No  . Lack of Transportation (Non-Medical): No  Physical Activity: Not on file  Stress: No Stress Concern Present  . Feeling of Stress : Not at all  Social Connections: Unknown  . Frequency of Communication with Friends and Family: More than three times a week  . Frequency of Social Gatherings with Friends and Family: More than three times a week  . Attends Religious Services: Not on file  . Active Member of Clubs or Organizations: Not on file  . Attends Archivist Meetings: Not on file  . Marital Status: Not on file  Intimate Partner Violence: Not At Risk  . Fear of Current or Ex-Partner: No  . Emotionally Abused: No  . Physically Abused: No  . Sexually Abused: No   Social History   Social History Narrative  .  Not on file     ROS: Negative.     PE: HEENT: Negative. Lungs: Clear. Cardio: RR.   Assessment/Plan:  Proceed with planned colonoscopy.    Forest Gleason [facility] 05/07/2020

## 2020-05-07 NOTE — Anesthesia Preprocedure Evaluation (Signed)
Anesthesia Evaluation  Patient identified by MRN, date of birth, ID band Patient awake    Reviewed: Allergy & Precautions, H&P , NPO status , Patient's Chart, lab work & pertinent test results, reviewed documented beta blocker date and time   History of Anesthesia Complications Negative for: history of anesthetic complications  Airway Mallampati: III  TM Distance: >3 FB Neck ROM: full    Dental no notable dental hx. (+) Partial Upper, Partial Lower   Pulmonary neg shortness of breath, neg sleep apnea, COPD, neg recent URI, former smoker,    Pulmonary exam normal breath sounds clear to auscultation       Cardiovascular Exercise Tolerance: Good hypertension, Normal cardiovascular exam Rhythm:regular Rate:Normal     Neuro/Psych negative neurological ROS  negative psych ROS   GI/Hepatic Neg liver ROS, GERD  ,  Endo/Other  negative endocrine ROS  Renal/GU negative Renal ROS  negative genitourinary   Musculoskeletal   Abdominal   Peds  Hematology negative hematology ROS (+) anemia ,   Anesthesia Other Findings . Bowel trouble  . Breast screening, unspecified   atypical ductal hyperplasia, declined excision. . Colon polyp 2011 . COPD (chronic obstructive pulmonary disease) ([place])  . Diverticulosis  . Duodenitis  . GERD (gastroesophageal reflux disease)  . Glaucoma  . Lump or mass in breast  . Personal history of tobacco use, presenting hazards to health  . Special screening for malignant neoplasms, colon     Reproductive/Obstetrics negative OB ROS                             Anesthesia Physical  Anesthesia Plan  ASA: II  Anesthesia Plan: General   Post-op Pain Management:    Induction:   PONV Risk Score and Plan: 2 and Propofol infusion and TIVA  Airway Management Planned: Natural Airway and Nasal Cannula  Additional Equipment:   Intra-op Plan:   Post-operative  Plan:   Informed Consent: I have reviewed the patients History and Physical, chart, labs and discussed the procedure including the risks, benefits and alternatives for the proposed anesthesia with the patient or authorized representative who has indicated his/her understanding and acceptance.       Plan Discussed with: Anesthesiologist, CRNA and Surgeon  Anesthesia Plan Comments:         Anesthesia Quick Evaluation

## 2020-05-07 NOTE — Transfer of Care (Signed)
Immediate Anesthesia Transfer of Care Note  Patient: Sharon Webster  Procedure(s) Performed: COLONOSCOPY WITH PROPOFOL (N/A )  Patient Location: Endoscopy Unit  Anesthesia Type:General  Level of Consciousness: drowsy  Airway & Oxygen Therapy: Patient Spontanous Breathing  Post-op Assessment: Report given to RN and Post -op Vital signs reviewed and stable  Post vital signs: Reviewed and stable  Last Vitals:  Vitals Value Taken Time  BP    Temp    Pulse 84 05/07/20 1025  Resp 14 05/07/20 1025  SpO2 99 % 05/07/20 1025  Vitals shown include unvalidated device data.  Last Pain:  Vitals:   05/07/20 0932  TempSrc: Temporal  PainSc: 0-No pain         Complications: No complications documented.

## 2020-05-07 NOTE — Op Note (Signed)
[facility] Gastroenterology Patient Name: Sharon Webster Procedure Date: 05/07/2020 9:48 AM MRN: [id number] Account #: [id number] Date of Birth: [birth year] Admit Type: Outpatient Age: 75 Room: [facility] ENDO ROOM 1 Gender: Female Note Status: Finalized Procedure:             Colonoscopy Indications:           High risk colon cancer surveillance: Personal history                         of colonic polyps Providers:             Robert Bellow, MD Referring MD:          Deborra Medina, MD (Referring MD) Medicines:             Monitored Anesthesia Care Complications:         No immediate complications. Procedure:             Pre-Anesthesia Assessment:                        - Prior to the procedure, a History and Physical was                         performed, and patient medications, allergies and                         sensitivities were reviewed. The patient's tolerance                         of previous anesthesia was reviewed.                        - The risks and benefits of the procedure and the                         sedation options and risks were discussed with the                         patient. All questions were answered and informed                         consent was obtained.                        After obtaining informed consent, the colonoscope was                         passed under direct vision. Throughout the procedure,                         the patient's blood pressure, pulse, and oxygen                         saturations were monitored continuously. The                         Colonoscope was introduced through the anus and                         advanced to the the cecum, identified  by appendiceal                         orifice and ileocecal valve. The colonoscopy was                         performed without difficulty. The patient tolerated                         the procedure well. The quality of the bowel                          preparation was excellent. Findings:      A 12 mm polyp was found in the ascending colon mid ascending colon. The       polyp was semi-pedunculated. The polyp was removed with a hot snare.       Resection and retrieval were complete.      Multiple medium-mouthed diverticula were found in the sigmoid colon.      The retroflexed view of the distal rectum and anal verge was normal and       showed no anal or rectal abnormalities. Impression:            - One 12 mm polyp in the ascending colon in the mid                         ascending colon, removed with a hot snare. Resected                         and retrieved.                        - Diverticulosis in the sigmoid colon.                        - The distal rectum and anal verge are normal on                         retroflexion view. Recommendation:        - Telephone endoscopist for pathology results in 1                         week. Procedure Code(s):     --- Professional ---                        [phone], Colonoscopy, flexible; with removal of                         tumor(s), polyp(s), or other lesion(s) by snare                         technique Diagnosis Code(s):     --- Professional ---                        Z86.010, Personal history of colonic polyps                        K63.5, Polyp of colon  K57.30, Diverticulosis of large intestine without                         perforation or abscess without bleeding CPT copyright 2019 [employer]. All rights reserved. The codes documented in this report are preliminary and upon coder review may  be revised to meet current compliance requirements. Robert Bellow, MD 05/07/2020 10:24:49 AM This report has been signed electronically. Number of Addenda: 0 Note Initiated On: 05/07/2020 9:48 AM Scope Withdrawal Time: 0 hours 9 minutes 48 seconds  Total Procedure Duration: 0 hours 18 minutes 18 seconds       [facility]

## 2020-05-07 NOTE — Anesthesia Postprocedure Evaluation (Signed)
Anesthesia Post Note  Patient: Sharon Webster  Procedure(s) Performed: COLONOSCOPY WITH PROPOFOL (N/A )  Patient location during evaluation: Phase II Anesthesia Type: General Level of consciousness: awake and alert, awake and oriented Pain management: pain level controlled Vital Signs Assessment: post-procedure vital signs reviewed and stable Respiratory status: spontaneous breathing, nonlabored ventilation and respiratory function stable Cardiovascular status: blood pressure returned to baseline and stable Postop Assessment: no apparent nausea or vomiting Anesthetic complications: no   No complications documented.   Last Vitals:  Vitals:   05/07/20 1046 05/07/20 1053  BP: (!) 142/72 (!) 144/75  Pulse: 80 80  Resp: 18 14  Temp:    SpO2: 100% 100%    Last Pain:  Vitals:   05/07/20 1053  TempSrc:   PainSc: 0-No pain                 Phill Mutter

## 2020-05-08 ENCOUNTER — Encounter: Payer: Self-pay | Admitting: General Surgery

## 2020-05-08 LAB — SURGICAL PATHOLOGY

## 2020-05-10 ENCOUNTER — Other Ambulatory Visit: Payer: Self-pay | Admitting: Internal Medicine

## 2020-05-12 ENCOUNTER — Other Ambulatory Visit: Payer: Self-pay

## 2020-05-12 MED ORDER — DICYCLOMINE HCL 20 MG PO TABS: 20.0000 mg | ORAL_TABLET | Freq: Three times a day (TID) | ORAL | 1 refills | Status: DC

## 2020-09-30 ENCOUNTER — Telehealth: Payer: Self-pay | Admitting: Internal Medicine

## 2020-09-30 DIAGNOSIS — Z1231 Encounter for screening mammogram for malignant neoplasm of breast: Secondary | ICD-10-CM

## 2020-09-30 NOTE — Telephone Encounter (Signed)
Patient is requesting a referral put in for her yearly mammogram. She goes to [facility] in [place].

## 2020-09-30 NOTE — Telephone Encounter (Signed)
Printed ordered for a signature, placed in quick sign folder.     Faxed order.

## 2020-10-01 ENCOUNTER — Telehealth: Payer: Self-pay | Admitting: Internal Medicine

## 2020-11-03 ENCOUNTER — Encounter: Payer: Self-pay | Admitting: Internal Medicine

## 2020-11-03 ENCOUNTER — Other Ambulatory Visit: Payer: Self-pay | Admitting: Internal Medicine

## 2020-12-18 DIAGNOSIS — Z1231 Encounter for screening mammogram for malignant neoplasm of breast: Secondary | ICD-10-CM | POA: Diagnosis not present

## 2020-12-18 LAB — HM MAMMOGRAPHY

## 2021-03-04 ENCOUNTER — Ambulatory Visit (INDEPENDENT_AMBULATORY_CARE_PROVIDER_SITE_OTHER): Payer: Medicare PPO

## 2021-03-04 VITALS — Ht 60.0 in | Wt 132.0 lb

## 2021-03-04 DIAGNOSIS — Z0000 Encounter for general adult medical examination without abnormal findings: Secondary | ICD-10-CM

## 2021-03-04 NOTE — Progress Notes (Addendum)
Subjective:   Sharon Webster is a 75 y.o. female who presents for Medicare Annual (Subsequent) preventive examination.  Review of Systems    No ROS.  Medicare Wellness Virtual Visit.  Visual/audio telehealth visit, UTA vital signs.   See social history for additional risk factors.   Cardiac Risk Factors include: advanced age (>54men, >80 women)     Objective:    Today's Vitals   03/04/21 1118  Weight: 132 lb (59.9 kg)  Height: 5' (1.524 m)   Body mass index is 25.78 kg/m.  Advanced Directives 03/04/2021 05/07/2020 03/03/2020 02/21/2018 02/17/2017 02/18/2016 02/18/2015  Does Patient Have a Medical Advance Directive? No No No No No No Yes  Type of Advance Directive - - - - - - Living will;Healthcare Power of Attorney  Does patient want to make changes to medical advance directive? - - - No - Patient declined - - No - Patient declined  Copy of [place] in Chart? - - - - - - No - copy requested  Would patient like information on creating a medical advance directive? No - Patient declined No - Patient declined No - Patient declined - No - Patient declined No - Patient declined -    Current Medications (verified) Outpatient Encounter Medications as of 03/04/2021  Medication Sig   cholecalciferol (VITAMIN D3) 25 MCG (1000 UT) tablet Take 1,000 Units by mouth daily.   dicyclomine (BENTYL) 20 MG tablet TAKE 1 TABLET(20 MG) BY MOUTH THREE TIMES DAILY BEFORE MEALS   hydrochlorothiazide (HYDRODIURIL) 12.5 MG tablet TAKE 1 TABLET(12.5 MG) BY MOUTH DAILY   pantoprazole (PROTONIX) 40 MG tablet TAKE 1 TABLET(40 MG) BY MOUTH DAILY   pravastatin (PRAVACHOL) 20 MG tablet TAKE 1 TABLET(20 MG) BY MOUTH DAILY   No facility-administered encounter medications on file as of 03/04/2021.    Allergies (verified) Patient has no known allergies.   History: Past Medical History:  Diagnosis Date   Bowel trouble    Breast screening, unspecified    atypical ductal hyperplasia,  declined excision.   Colon polyp 2011   COPD (chronic obstructive pulmonary disease) (HCC)    Diverticulosis    Duodenitis    GERD (gastroesophageal reflux disease)    Glaucoma    Lump or mass in breast    Personal history of tobacco use, presenting hazards to health    Special screening for malignant neoplasms, colon    Past Surgical History:  Procedure Laterality Date   BREAST BIOPSY Right 12/14/2011   3:00, Finesse biopsy,December 14, 2011. This was completed as her FNA showed atypia, papilloma, "features" of  ADH  identified. Declined formal excision. I elected not to offer chemoprevention without formal excision. .(January 13, 2012.)    COLONOSCOPY  08/20/2009   Tubular adenoma x2. Transverse and descending colon Dr. Dionne Milo   COLONOSCOPY WITH PROPOFOL N/A 04/02/2015   Tubular adenoma. F/U 2022.  Surgeon: Robert Bellow, MD;  Location: [facility] ENDOSCOPY;  Service: Endoscopy;  Laterality: N/A;   COLONOSCOPY WITH PROPOFOL N/A 05/07/2020   Procedure: COLONOSCOPY WITH PROPOFOL;  Surgeon: Robert Bellow, MD;  Location: [facility] ENDOSCOPY;  Service: Endoscopy;  Laterality: N/A;   Family History  Problem Relation Age of Onset   Heart disease Mother    COPD Mother    Hypertension Mother    Cancer Father        metastatic prostate CA   Stroke Sister        hemorrhagic CVA   Heart disease Sister  restrictive cardiomyopathy   Aneurysm Brother    Social History   Socioeconomic History   Marital status: Widowed    Spouse name: Not on file   Number of children: Not on file   Years of education: Not on file   Highest education level: Not on file  Occupational History   Not on file  Tobacco Use   Smoking status: Former    Packs/day: 1.00    Years: 20.00    Pack years: 20.00    Types: Cigarettes    Quit date: 05/15/2001    Years since quitting: 19.8   Smokeless tobacco: Never  Vaping Use   Vaping Use: Never used  Substance and Sexual Activity   Alcohol use: Yes     Alcohol/week: 3.0 standard drinks    Types: 3 Cans of beer per week   Drug use: No   Sexual activity: Not Currently  Other Topics Concern   Not on file  Social History Narrative   Not on file   Social Determinants of Health   Financial Resource Strain: Low Risk    Difficulty of Paying Living Expenses: Not hard at all  Food Insecurity: No Food Insecurity   Worried About Charity fundraiser in the Last Year: Never true   [place] in the Last Year: Never true  Transportation Needs: No Transportation Needs   Lack of Transportation (Medical): No   Lack of Transportation (Non-Medical): No  Physical Activity: Not on file  Stress: No Stress Concern Present   Feeling of Stress : Not at all  Social Connections: Unknown   Frequency of Communication with Friends and Family: More than three times a week   Frequency of Social Gatherings with Friends and Family: More than three times a week   Attends Religious Services: Not on Electrical engineer or Organizations: Not on file   Attends Archivist Meetings: Not on file   Marital Status: Not on file    Tobacco Counseling Counseling given: Not Answered   Clinical Intake:  Pre-visit preparation completed: Yes        Diabetes: No  How often do you need to have someone help you when you read instructions, pamphlets, or other written materials from your doctor or pharmacy?: 1 - Never   Interpreter Needed?: No      Activities of Daily Living In your present state of health, do you have any difficulty performing the following activities: 03/04/2021  Hearing? N  Vision? N  Difficulty concentrating or making decisions? N  Walking or climbing stairs? N  Comment Chronic R hip pain  Dressing or bathing? N  Doing errands, shopping? N  Preparing Food and eating ? N  Using the Toilet? N  In the past six months, have you accidently leaked urine? N  Do you have problems with loss of bowel control? N   Managing your Medications? N  Managing your Finances? N  Housekeeping or managing your Housekeeping? N  Some recent data might be hidden    Patient Care Team: Crecencio Mc, MD as PCP - General (Internal Medicine) Bary Castilla, Forest Gleason, MD (General Surgery) Crecencio Mc, MD (Internal Medicine)  Indicate any recent Medical Services you may have received from other than [facility] providers in the past year (date may be approximate).     Assessment:   This is a routine wellness examination for Patterson.  Virtual Visit via Telephone Note  I connected with  Sharon Mola  Webster on 03/04/21 at 11:15 AM EST by telephone and verified that I am speaking with the correct person using two identifiers.  Persons participating in the virtual visit: patient/Nurse Health Advisor   I discussed the limitations, risks, security and privacy concerns of performing an evaluation and management service by telephone and the availability of in person appointments. The patient expressed understanding and agreed to proceed.  Interactive audio and video telecommunications were attempted between this nurse and patient, however failed, due to patient having technical difficulties OR patient did not have access to video capability.  We continued and completed visit with audio only.  Some vital signs may be absent or patient reported.   Hearing/Vision screen Hearing Screening - Comments:: Patient is able to hear conversational tones without difficulty. No issues reported. Vision Screening - Comments:: Wears corrective lenses  They have seen their ophthalmologist in the last 12 months.  Dietary issues and exercise activities discussed: Current Exercise Habits: The patient does not participate in regular exercise at present Regular diet Good water intake   Goals Addressed             This Visit's Progress    DIET - REDUCE SUGAR INTAKE       Increase physical activity       Walk for exercise as  tolerated        Depression Screen PHQ 2/9 Scores 03/04/2021 03/03/2020 02/23/2019 02/21/2018 02/17/2017 02/18/2016 02/18/2015  PHQ - 2 Score 0 0 0 0 1 0 0  PHQ- 9 Score - - - - 2 - -    Fall Risk Fall Risk  03/04/2021 04/03/2020 03/03/2020 09/07/2019 02/26/2019  Falls in the past year? 0 0 0 0 0  Number falls in past yr: - - 0 - -  Injury with Fall? - - 0 - -  Follow up Falls evaluation completed Falls evaluation completed Falls evaluation completed Falls evaluation completed Falls evaluation completed  Comment - - - - -    FALL RISK PREVENTION PERTAINING TO THE HOME: Home free of loose throw rugs in walkways, pet beds, electrical cords, etc? Yes  Adequate lighting in your home to reduce risk of falls? Yes   ASSISTIVE DEVICES UTILIZED TO PREVENT FALLS: Life alert? No  Use of a cane, walker or w/c? No  Grab bars in the bathroom? Yes  Shower chair or bench in shower? Yes  Elevated toilet seat or a handicapped toilet? Yes   TIMED UP AND GO: Was the test performed? No .   Cognitive Function: Patient is alert and oriented x3.  MMSE - Mini Mental State Exam 02/17/2017 02/18/2016 02/18/2015  Orientation to time 5 5 5   Orientation to Place 5 5 5   Registration 3 3 3   Attention/ Calculation 5 5 5   Recall 3 3 3   Language- name 2 objects 2 2 2   Language- repeat 1 1 1   Language- follow 3 step command 3 3 3   Language- read & follow direction 1 1 1   Write a sentence 1 1 1   Copy design 1 1 1   Total score 30 30 30      6CIT Screen 03/03/2020 02/23/2019 02/21/2018  What Year? 0 points 0 points 0 points  What month? 0 points 0 points 0 points  What time? 0 points 0 points 0 points  Count back from 20 0 points 0 points 0 points  Months in reverse 0 points 0 points 0 points  Repeat phrase 0 points 0 points 0 points  Total Score  0 0 0    Immunizations Immunization History  Administered Date(s) Administered   Hep A / Hep B 07/31/2014, 09/03/2014, 02/04/2015   Influenza Split  02/04/2014, 01/31/2015   Influenza, High Dose Seasonal PF 01/08/2016   Influenza,inj,Quad PF,6+ Mos 01/31/2015, 12/13/2016, 02/21/2018, 12/12/2018   Influenza-Unspecified 02/12/2013, 12/30/2016   PFIZER(Purple Top)SARS-COV-2 Vaccination 05/10/2019, 05/31/2019, 03/20/2020   Pneumococcal Conjugate-13 01/31/2015   Tdap 01/06/2010   TDAP status: Due, Education has been provided regarding the importance of this vaccine. Advised may receive this vaccine at local pharmacy or Health Dept. Aware to provide a copy of the vaccination record if obtained from local pharmacy or Health Dept. Verbalized acceptance and understanding. Deferred.   Pneumococcal vaccine status: Due, Education has been provided regarding the importance of this vaccine. Advised may receive this vaccine at local pharmacy or Health Dept. Aware to provide a copy of the vaccination record if obtained from local pharmacy or Health Dept. Verbalized acceptance and understanding.  Shingrix Completed?: No.    Education has been provided regarding the importance of this vaccine. Patient has been advised to call insurance company to determine out of pocket expense if they have not yet received this vaccine. Advised may also receive vaccine at local pharmacy or Health Dept. Verbalized acceptance and understanding.  Screening Tests Health Maintenance  Topic Date Due   COVID-19 Vaccine (4 - Booster for Pfizer series) 03/20/2021 (Originally 05/15/2020)   Zoster Vaccines- Shingrix (1 of 2) 06/02/2021 (Originally 08/12/1964)   INFLUENZA VACCINE  06/12/2021 (Originally 10/13/2020)   Pneumonia Vaccine 45+ Years old (2 - PPSV23 if available, else PCV20) 03/04/2022 (Originally 01/31/2016)   TETANUS/TDAP  03/04/2022 (Originally 01/07/2020)   MAMMOGRAM  12/18/2021   COLONOSCOPY (Pts 45-85yrs Insurance coverage will need to be confirmed)  05/08/2023   DEXA SCAN  Completed   Hepatitis C Screening  Completed   HPV VACCINES  Aged Out   Health  Maintenance There are no preventive care reminders to display for this patient.  Colorectal cancer screening: Type of screening: Colonoscopy. Completed 05/2020. Repeat every 3 years  Lung Cancer Screening: (Low Dose CT Chest recommended if Age 26-80 years, 30 pack-year currently smoking OR have quit w/in 15years.) does not qualify.   Vision Screening: Recommended annual ophthalmology exams for early detection of glaucoma and other disorders of the eye.  Dental Screening: Recommended annual dental exams for proper oral hygiene  Community Resource Referral / Chronic Care Management: CRR required this visit?  No   CCM required this visit?  No      Plan:   Keep all routine maintenance appointments.   I have personally reviewed and noted the following in the patients chart:   Medical and social history Use of alcohol, tobacco or illicit drugs  Current medications and supplements including opioid prescriptions. Not taking opioid.  Functional ability and status Nutritional status Physical activity Advanced directives List of other physicians Hospitalizations, surgeries, and ER visits in previous 12 months Vitals Screenings to include cognitive, depression, and falls Referrals and appointments  In addition, I have reviewed and discussed with patient certain preventive protocols, quality metrics, and best practice recommendations. A written personalized care plan for preventive services as well as general preventive health recommendations were provided to patient.     OBrien-Blaney, Spencer Peterkin L, LPN   [id number]      I have reviewed the above information and agree with above.   Deborra Medina, MD

## 2021-03-04 NOTE — Patient Instructions (Addendum)
[name] , Thank you for taking time to come for your Medicare Wellness Visit. I appreciate your ongoing commitment to your health goals. Please review the following plan we discussed and let me know if I can assist you in the future.   These are the goals we discussed:  Goals      DIET - REDUCE SUGAR INTAKE     Increase physical activity     Walk for exercise as tolerated         This is a list of the screening recommended for you and due dates:  Health Maintenance  Topic Date Due   COVID-19 Vaccine (4 - Booster for Pfizer series) 03/20/2021*   Zoster (Shingles) Vaccine (1 of 2) 06/02/2021*   Flu Shot  06/12/2021*   Pneumonia Vaccine (2 - PPSV23 if available, else PCV20) 03/04/2022*   Tetanus Vaccine  03/04/2022*   Mammogram  12/18/2021   Colon Cancer Screening  05/08/2023   DEXA scan (bone density measurement)  Completed   Hepatitis C Screening: USPSTF Recommendation to screen - Ages 44-79 yo.  Completed   HPV Vaccine  Aged Out  *Topic was postponed. The date shown is not the original due date.    Advanced directives: not yet completed  Conditions/risks identified: none new  Follow up in one year for your annual wellness visit    Preventive Care 65 Years and Older, Female Preventive care refers to lifestyle choices and visits with your health care provider that can promote health and wellness. What does preventive care include? A yearly physical exam. This is also called an annual well check. Dental exams once or twice a year. Routine eye exams. Ask your health care provider how often you should have your eyes checked. Personal lifestyle choices, including: Daily care of your teeth and gums. Regular physical activity. Eating a healthy diet. Avoiding tobacco and drug use. Limiting alcohol use. Practicing safe sex. Taking low-dose aspirin every day. Taking vitamin and mineral supplements as recommended by your health care provider. What happens during an  annual well check? The services and screenings done by your health care provider during your annual well check will depend on your age, overall health, lifestyle risk factors, and family history of disease. Counseling  Your health care provider may ask you questions about your: Alcohol use. Tobacco use. Drug use. Emotional well-being. Home and relationship well-being. Sexual activity. Eating habits. History of falls. Memory and ability to understand (cognition). Work and work Statistician. Reproductive health. Screening  You may have the following tests or measurements: Height, weight, and BMI. Blood pressure. Lipid and cholesterol levels. These may be checked every 5 years, or more frequently if you are over 69 years old. Skin check. Lung cancer screening. You may have this screening every year starting at age 63 if you have a 30-pack-year history of smoking and currently smoke or have quit within the past 15 years. Fecal occult blood test (FOBT) of the stool. You may have this test every year starting at age 28. Flexible sigmoidoscopy or colonoscopy. You may have a sigmoidoscopy every 5 years or a colonoscopy every 10 years starting at age 35. Hepatitis C blood test. Hepatitis B blood test. Sexually transmitted disease (STD) testing. Diabetes screening. This is done by checking your blood sugar (glucose) after you have not eaten for a while (fasting). You may have this done every 1-3 years. Bone density scan. This is done to screen for osteoporosis. You may have this done starting at age 88.  Mammogram. This may be done every 1-2 years. Talk to your health care provider about how often you should have regular mammograms. Talk with your health care provider about your test results, treatment options, and if necessary, the need for more tests. Vaccines  Your health care provider may recommend certain vaccines, such as: Influenza vaccine. This is recommended every year. Tetanus,  diphtheria, and acellular pertussis (Tdap, Td) vaccine. You may need a Td booster every 10 years. Zoster vaccine. You may need this after age 69. Pneumococcal 13-valent conjugate (PCV13) vaccine. One dose is recommended after age 61. Pneumococcal polysaccharide (PPSV23) vaccine. One dose is recommended after age 16. Talk to your health care provider about which screenings and vaccines you need and how often you need them. This information is not intended to replace advice given to you by your health care provider. Make sure you discuss any questions you have with your health care provider. Document Released: 03/28/2015 Document Revised: 11/19/2015 Document Reviewed: 12/31/2014 Elsevier Interactive Patient Education  2017 [place] Prevention in the Home Falls can cause injuries. They can happen to people of all ages. There are many things you can do to make your home safe and to help prevent falls. What can I do on the outside of my home? Regularly fix the edges of walkways and driveways and fix any cracks. Remove anything that might make you trip as you walk through a door, such as a raised step or threshold. Trim any bushes or trees on the path to your home. Use bright outdoor lighting. Clear any walking paths of anything that might make someone trip, such as rocks or tools. Regularly check to see if handrails are loose or broken. Make sure that both sides of any steps have handrails. Any raised decks and porches should have guardrails on the edges. Have any leaves, snow, or ice cleared regularly. Use sand or salt on walking paths during winter. Clean up any spills in your garage right away. This includes oil or grease spills. What can I do in the bathroom? Use night lights. Install grab bars by the toilet and in the tub and shower. Do not use towel bars as grab bars. Use non-skid mats or decals in the tub or shower. If you need to sit down in the shower, use a plastic,  non-slip stool. Keep the floor dry. Clean up any water that spills on the floor as soon as it happens. Remove soap buildup in the tub or shower regularly. Attach bath mats securely with double-sided non-slip rug tape. Do not have throw rugs and other things on the floor that can make you trip. What can I do in the bedroom? Use night lights. Make sure that you have a light by your bed that is easy to reach. Do not use any sheets or blankets that are too big for your bed. They should not hang down onto the floor. Have a firm chair that has side arms. You can use this for support while you get dressed. Do not have throw rugs and other things on the floor that can make you trip. What can I do in the kitchen? Clean up any spills right away. Avoid walking on wet floors. Keep items that you use a lot in easy-to-reach places. If you need to reach something above you, use a strong step stool that has a grab bar. Keep electrical cords out of the way. Do not use floor polish or wax that makes floors slippery. If  you must use wax, use non-skid floor wax. Do not have throw rugs and other things on the floor that can make you trip. What can I do with my stairs? Do not leave any items on the stairs. Make sure that there are handrails on both sides of the stairs and use them. Fix handrails that are broken or loose. Make sure that handrails are as long as the stairways. Check any carpeting to make sure that it is firmly attached to the stairs. Fix any carpet that is loose or worn. Avoid having throw rugs at the top or bottom of the stairs. If you do have throw rugs, attach them to the floor with carpet tape. Make sure that you have a light switch at the top of the stairs and the bottom of the stairs. If you do not have them, ask someone to add them for you. What else can I do to help prevent falls? Wear shoes that: Do not have high heels. Have rubber bottoms. Are comfortable and fit you well. Are closed  at the toe. Do not wear sandals. If you use a stepladder: Make sure that it is fully opened. Do not climb a closed stepladder. Make sure that both sides of the stepladder are locked into place. Ask someone to hold it for you, if possible. Clearly mark and make sure that you can see: Any grab bars or handrails. First and last steps. Where the edge of each step is. Use tools that help you move around (mobility aids) if they are needed. These include: Canes. Walkers. Scooters. Crutches. Turn on the lights when you go into a dark area. Replace any light bulbs as soon as they burn out. Set up your furniture so you have a clear path. Avoid moving your furniture around. If any of your floors are uneven, fix them. If there are any pets around you, be aware of where they are. Review your medicines with your doctor. Some medicines can make you feel dizzy. This can increase your chance of falling. Ask your doctor what other things that you can do to help prevent falls. This information is not intended to replace advice given to you by your health care provider. Make sure you discuss any questions you have with your health care provider. Document Released: 12/26/2008 Document Revised: 08/07/2015 Document Reviewed: 04/05/2014 Elsevier Interactive Patient Education  2017 [employer].

## 2021-03-31 DIAGNOSIS — H5203 Hypermetropia, bilateral: Secondary | ICD-10-CM | POA: Diagnosis not present

## 2021-03-31 DIAGNOSIS — H2513 Age-related nuclear cataract, bilateral: Secondary | ICD-10-CM | POA: Diagnosis not present

## 2021-03-31 DIAGNOSIS — H524 Presbyopia: Secondary | ICD-10-CM | POA: Diagnosis not present

## 2021-03-31 DIAGNOSIS — H401134 Primary open-angle glaucoma, bilateral, indeterminate stage: Secondary | ICD-10-CM | POA: Diagnosis not present

## 2021-03-31 DIAGNOSIS — H52223 Regular astigmatism, bilateral: Secondary | ICD-10-CM | POA: Diagnosis not present

## 2021-05-05 ENCOUNTER — Other Ambulatory Visit: Payer: Self-pay | Admitting: Internal Medicine

## 2021-08-12 ENCOUNTER — Other Ambulatory Visit: Payer: Self-pay

## 2021-08-12 MED ORDER — DICYCLOMINE HCL 20 MG PO TABS: ORAL_TABLET | ORAL | 1 refills | Status: DC

## 2021-08-18 ENCOUNTER — Ambulatory Visit: Payer: Medicare PPO | Admitting: Internal Medicine

## 2021-08-18 ENCOUNTER — Encounter: Payer: Self-pay | Admitting: Internal Medicine

## 2021-08-18 VITALS — BP 136/72 | HR 84 | Temp 98.4°F | Ht 60.0 in | Wt 128.4 lb

## 2021-08-18 DIAGNOSIS — Z860100 Personal history of colon polyps, unspecified: Secondary | ICD-10-CM

## 2021-08-18 DIAGNOSIS — R7303 Prediabetes: Secondary | ICD-10-CM

## 2021-08-18 DIAGNOSIS — E7849 Other hyperlipidemia: Secondary | ICD-10-CM

## 2021-08-18 DIAGNOSIS — Z1231 Encounter for screening mammogram for malignant neoplasm of breast: Secondary | ICD-10-CM

## 2021-08-18 DIAGNOSIS — Z0000 Encounter for general adult medical examination without abnormal findings: Secondary | ICD-10-CM

## 2021-08-18 DIAGNOSIS — R5383 Other fatigue: Secondary | ICD-10-CM | POA: Diagnosis not present

## 2021-08-18 DIAGNOSIS — D485 Neoplasm of uncertain behavior of skin: Secondary | ICD-10-CM | POA: Insufficient documentation

## 2021-08-18 DIAGNOSIS — I10 Essential (primary) hypertension: Secondary | ICD-10-CM | POA: Diagnosis not present

## 2021-08-18 DIAGNOSIS — J449 Chronic obstructive pulmonary disease, unspecified: Secondary | ICD-10-CM | POA: Diagnosis not present

## 2021-08-18 DIAGNOSIS — N6099 Unspecified benign mammary dysplasia of unspecified breast: Secondary | ICD-10-CM

## 2021-08-18 MED ORDER — ZOSTER VAC RECOMB ADJUVANTED 50 MCG/0.5ML IM SUSR: 0.5000 mL | Freq: Once | INTRAMUSCULAR | 1 refills | Status: AC

## 2021-08-18 MED ORDER — TETANUS-DIPHTH-ACELL PERTUSSIS 5-2.5-18.5 LF-MCG/0.5 IM SUSY: 0.5000 mL | PREFILLED_SYRINGE | Freq: Once | INTRAMUSCULAR | 0 refills | Status: AC

## 2021-08-18 NOTE — Assessment & Plan Note (Signed)

## 2021-08-18 NOTE — Progress Notes (Addendum)
Patient ID: Sharon Webster, female    DOB: [birth year]  Age: 76 y.o. MRN: [id number]  The patient is here for annual follow up and examination and management of other chronic and acute problems.   The risk factors are reflected in the social history.  The roster of all physicians providing medical care to patient - is listed in the Snapshot section of the chart.  Activities of daily living:  The patient is 100% independent in all ADLs: dressing, toileting, feeding as well as independent mobility  Home safety : The patient has smoke detectors in the home. They wear seatbelts.  There are no firearms at home. There is no violence in the home.   There is no risks for hepatitis, STDs or HIV. There is no   history of blood transfusion. They have no travel history to infectious disease endemic areas of the world.  The patient has seen their dentist in the last six month. They have seen their eye doctor in the last year. They admit to slight hearing difficulty with regard to whispered voices and some television programs.  They have deferred audiologic testing in the last year.  They do not  have excessive sun exposure. Discussed the need for sun protection: hats, long sleeves and use of sunscreen if there is significant sun exposure.   Diet: the importance of a healthy diet is discussed. They do have a healthy diet.  The benefits of regular aerobic exercise were discussed. She walks 4 times per week ,  20 minutes.   Depression screen: there are no signs or vegative symptoms of depression- irritability, change in appetite, anhedonia, sadness/tearfullness.  Cognitive assessment: the patient manages all their financial and personal affairs and is actively engaged. They could relate day,date,year and events; recalled 2/3 objects at 3 minutes; performed clock-face test normally.  The following portions of the patient's history were reviewed and updated as appropriate: allergies, current medications, past  family history, past medical history,  past surgical history, past social history  and problem list.  Visual acuity was not assessed per patient preference since she has regular follow up with her ophthalmologist. Hearing and body mass index were assessed and reviewed.   During the course of the visit the patient was educated and counseled about appropriate screening and preventive services including : fall prevention , diabetes screening, nutrition counseling, colorectal cancer screening, and recommended immunizations.    CC: The primary encounter diagnosis was Encounter for preventive health examination. 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  1) no complaints except fatigue:  Just returned from a mountain trip with women friends  then to [place] to celebrate her birthday,  then spent Saturday  mowing her grass,  Sunday sewing for the "old folk's home"  2) no recent skin check.       History Gladys has a past medical history of Bowel trouble, Breast screening, unspecified, Colon polyp (2011), COPD (chronic obstructive pulmonary disease) ([place]), Diverticulosis, Duodenitis, GERD (gastroesophageal reflux disease), Glaucoma, Lump or mass in breast, Personal history of tobacco use, presenting hazards to health, and Special screening for malignant neoplasms, colon.   She has a past surgical history that includes Colonoscopy (08/20/2009); Breast biopsy (Right, 12/14/2011); Colonoscopy with propofol (N/A, 04/02/2015); and Colonoscopy with propofol (  N/A, 05/07/2020).   Her family history includes Aneurysm in her brother; COPD in her mother; Cancer in her father; Heart disease in  her mother and sister; Hypertension in her mother; Stroke in her sister.She reports that she quit smoking about 20 years ago. Her smoking use included cigarettes. She has a 20.00 pack-year smoking history. She has never used smokeless tobacco. She reports current alcohol use of about 3.0 standard drinks of alcohol per week. She reports that she does not use drugs.  Outpatient Medications Prior to Visit  Medication Sig Dispense Refill   cholecalciferol (VITAMIN D3) 25 MCG (1000 UT) tablet Take 1,000 Units by mouth daily.     dicyclomine (BENTYL) 20 MG tablet TAKE 1 TABLET(20 MG) BY MOUTH THREE TIMES DAILY BEFORE MEALS 270 tablet 1   hydrochlorothiazide (HYDRODIURIL) 12.5 MG tablet TAKE 1 TABLET(12.5 MG) BY MOUTH DAILY 90 tablet 1   pantoprazole (PROTONIX) 40 MG tablet TAKE 1 TABLET(40 MG) BY MOUTH DAILY 90 tablet 1   vitamin B-12 (CYANOCOBALAMIN) 1000 MCG tablet Take 1,000 mcg by mouth daily.     pravastatin (PRAVACHOL) 20 MG tablet TAKE 1 TABLET(20 MG) BY MOUTH DAILY 90 tablet 1   No facility-administered medications prior to visit.    Review of Systems  Patient denies headache, fevers, malaise, unintentional weight loss, skin rash, eye pain, sinus congestion and sinus pain, sore throat, dysphagia,  hemoptysis , cough, dyspnea, wheezing, chest pain, palpitations, orthopnea, edema, abdominal pain, nausea, melena, diarrhea, constipation, flank pain, dysuria, hematuria, urinary  Frequency, nocturia, numbness, tingling, seizures,  Focal weakness, Loss of consciousness,  Tremor, insomnia, depression, anxiety, and suicidal ideation.     Objective:  BP 136/72 (BP Location: Left Arm, Patient Position: Sitting, Cuff Size: Normal)   Pulse 84   Temp 98.4 F (36.9 C) (Oral)   Ht 5' (1.524 m)   Wt 128 lb 6.4 oz (58.2 kg)   SpO2 97%   BMI 25.08 kg/m   Physical Exam . General appearance: alert, cooperative and appears stated age Head: Normocephalic, without obvious abnormality, atraumatic Eyes:  conjunctivae/corneas clear. PERRL, EOM's intact. Fundi benign. Ears: normal TM's and external ear canals both ears Nose: Nares normal. Septum midline. Mucosa normal. No drainage or sinus tenderness. Throat: lips, mucosa, and tongue normal; teeth and gums normal Neck: no adenopathy, no carotid bruit, no JVD, supple, symmetrical, trachea midline and thyroid not enlarged, symmetric, no tenderness/mass/nodules Lungs: clear to auscultation bilaterally Breasts: normal appearance, no masses or tenderness Heart: regular rate and rhythm, S1, S2 normal, no murmur, click, rub or gallop Abdomen: soft, non-tender; bowel sounds normal; no masses,  no organomegaly Extremities: extremities normal, atraumatic, no cyanosis or edema Pulses: 2+ and symmetric Skin: Skin color, texture, turgor normal. No rashes or lesions Neurologic: Alert and oriented X 3, normal strength and tone. Normal symmetric reflexes. Normal coordination and gait.     Assessment & Plan:   Problem List Items Addressed This Visit     Atypical ductal hyperplasia of breast    Continue annual mammograms at [facility]  Due in October       COPD (chronic obstructive pulmonary disease) ([place])    Asymptomatic. Quit smoking 20 year ago      Encounter for preventive health examination - Primary    age appropriate education and counseling updated, referrals for preventative services and immunizations addressed, dietary and smoking counseling addressed, most recent labs reviewed.  I have personally reviewed and have noted:   1) the patient's medical and social history 2)  The pt's use of alcohol, tobacco, and illicit drugs 3) The patient's current medications and supplements 4) Functional ability including ADL's, fall risk, home safety risk, hearing and visual impairment 5) Diet and physical activities 6) Evidence for depression or mood disorder 7) The patient's height, weight, and BMI have been recorded in the chart sinus rhythm.     I  have made referrals, and provided counseling and education based on review of the above      Essential hypertension    Well controlled on current regimen of hctz based on home readings .       Relevant Medications   atorvastatin (LIPITOR) 20 MG tablet   Other Relevant Orders   Comp Met (CMET) (Completed)   Urine Microalbumin w/creat. ratio (Completed)   Familial hyperlipidemia, high LDL    Changing low potency statin to medium potency statin  For LDL > 100  Lab Results  Component Value Date   CHOL 200 08/18/2021   HDL 54.80 08/18/2021   LDLCALC 115 (H) 08/18/2021   LDLDIRECT 121.0 08/18/2021   TRIG 153.0 (H) 08/18/2021   CHOLHDL 4 08/18/2021         Relevant Medications   atorvastatin (LIPITOR) 20 MG tablet   Other Relevant Orders   Lipid Profile (Completed)   Direct LDL (Completed)   Neoplasm of uncertain behavior of skin of nose    Tiny BCC suspected.  Referral to Dr Kellie Moor in progress      Relevant Orders   Ambulatory referral to Dermatology   Personal history of colonic polyps    Continue screening every 3 years per [place].       Prediabetes   Relevant Orders   Comp Met (CMET) (Completed)   HgB A1c (Completed)   Urine Microalbumin w/creat. ratio (Completed)   Other Visit Diagnoses     Other fatigue       Relevant Orders   TSH (Completed)   CBC with Differential/Platelet (Completed)   Breast cancer screening by mammogram       Relevant Orders   MM DIGITAL SCREENING BILATERAL       I have discontinued Benjamine Mola Bencomo's pravastatin. I am also having her start on Zoster Vaccine Adjuvanted, Tdap, and atorvastatin. Additionally, I am having her maintain her cholecalciferol, hydrochlorothiazide, pantoprazole, dicyclomine, and vitamin B-12.  Meds ordered this encounter  Medications   Zoster Vaccine Adjuvanted [facility]) injection    Sig: Inject 0.5 mLs into the muscle once for 1 dose.    Dispense:  0.5 mL    Refill:  1   Tdap (BOOSTRIX)  5-2.5-18.5 LF-MCG/0.5 injection    Sig: Inject 0.5 mLs into the muscle once for 1 dose.    Dispense:  0.5 mL    Refill:  0   atorvastatin (LIPITOR) 20 MG tablet    Sig: Take 1 tablet (20 mg total) by mouth daily.    Dispense:  90 tablet    Refill:  1    Medications Discontinued During This Encounter  Medication Reason   pravastatin (PRAVACHOL) 20 MG tablet     Follow-up: No follow-ups on file.   Crecencio Mc, MD

## 2021-08-18 NOTE — Assessment & Plan Note (Signed)
Tiny BCC suspected.  Referral to Dr Kellie Moor in progress

## 2021-08-18 NOTE — Patient Instructions (Signed)
  The Tdap (tetanus-diphtheria-whooping cough vaccine ) and Shingrix vaccines are now  100%  COVERED BY MEDICARE if you get them at your pharmacy     You can expect 24-48  hours of flu like symptoms after receiving each dose of the  shingles vaccine, so plan   Your annual mammogram has been ordered and is due in October at [place] .

## 2021-08-18 NOTE — Assessment & Plan Note (Signed)
Continue screening every 3 years per [place].

## 2021-08-18 NOTE — Assessment & Plan Note (Signed)
Continue annual mammograms at [facility] imaging  Due in October

## 2021-08-18 NOTE — Assessment & Plan Note (Addendum)
Well controlled on current regimen of hctz based on home readings .

## 2021-08-18 NOTE — Assessment & Plan Note (Addendum)
Asymptomatic. Quit smoking 20 year ago

## 2021-08-19 LAB — CBC WITH DIFFERENTIAL/PLATELET
Basophils Absolute: 0.1 10*3/uL (ref 0.0–0.1)
Basophils Relative: 1.1 % (ref 0.0–3.0)
Eosinophils Absolute: 0.1 10*3/uL (ref 0.0–0.7)
Eosinophils Relative: 0.9 % (ref 0.0–5.0)
HCT: 36.2 % (ref 36.0–46.0)
Hemoglobin: 12.3 g/dL (ref 12.0–15.0)
Lymphocytes Relative: 27.1 % (ref 12.0–46.0)
Lymphs Abs: 1.7 10*3/uL (ref 0.7–4.0)
MCHC: 34 g/dL (ref 30.0–36.0)
MCV: 84.6 fl (ref 78.0–100.0)
Monocytes Absolute: 0.4 10*3/uL (ref 0.1–1.0)
Monocytes Relative: 6.2 % (ref 3.0–12.0)
Neutro Abs: 4 10*3/uL (ref 1.4–7.7)
Neutrophils Relative %: 64.7 % (ref 43.0–77.0)
Platelets: 247 10*3/uL (ref 150.0–400.0)
RBC: 4.27 Mil/uL (ref 3.87–5.11)
RDW: 12.8 % (ref 11.5–15.5)
WBC: 6.3 10*3/uL (ref 4.0–10.5)

## 2021-08-19 LAB — MICROALBUMIN / CREATININE URINE RATIO
Creatinine,U: 61.4 mg/dL
Microalb Creat Ratio: 1.1 mg/g (ref 0.0–30.0)
Microalb, Ur: <0.7 mg/dL (ref 0.0–1.9)

## 2021-08-19 LAB — COMPREHENSIVE METABOLIC PANEL
ALT: 11 U/L (ref 0–35)
AST: 14 U/L (ref 0–37)
Albumin: 4.5 g/dL (ref 3.5–5.2)
Alkaline Phosphatase: 78 U/L (ref 39–117)
BUN: 14 mg/dL (ref 6–23)
CO2: 29 mEq/L (ref 19–32)
Calcium: 9.9 mg/dL (ref 8.4–10.5)
Chloride: 100 mEq/L (ref 96–112)
Creatinine, Ser: 0.88 mg/dL (ref 0.40–1.20)
GFR: 64.47 mL/min (ref >60.00)
Glucose, Bld: 95 mg/dL (ref 70–99)
Potassium: 3.8 mEq/L (ref 3.5–5.1)
Sodium: 140 mEq/L (ref 135–145)
Total Bilirubin: 0.4 mg/dL (ref 0.2–1.2)
Total Protein: 7 g/dL (ref 6.0–8.3)

## 2021-08-19 LAB — LIPID PANEL
Cholesterol: 200 mg/dL (ref 0–200)
HDL: 54.8 mg/dL (ref >39.00)
LDL Cholesterol: 115 mg/dL — ABNORMAL HIGH (ref 0–99)
NonHDL: 145.43
Total CHOL/HDL Ratio: 4
Triglycerides: 153 mg/dL — ABNORMAL HIGH (ref 0.0–149.0)
VLDL: 30.6 mg/dL (ref 0.0–40.0)

## 2021-08-19 LAB — HEMOGLOBIN A1C: Hgb A1c MFr Bld: 6.1 % (ref 4.6–6.5)

## 2021-08-19 LAB — TSH: TSH: 1.33 u[IU]/mL (ref 0.35–5.50)

## 2021-08-19 LAB — LDL CHOLESTEROL, DIRECT: Direct LDL: 121 mg/dL

## 2021-08-20 MED ORDER — ATORVASTATIN CALCIUM 20 MG PO TABS: 20.0000 mg | ORAL_TABLET | Freq: Every day | ORAL | 1 refills | Status: DC

## 2021-08-20 NOTE — Addendum Note (Signed)
Addended by: Crecencio Mc on: 08/20/2021 01:47 PM   Modules accepted: Orders

## 2021-08-20 NOTE — Assessment & Plan Note (Addendum)
Changing low potency statin to medium potency statin  For LDL > 100  Lab Results  Component Value Date   CHOL 200 08/18/2021   HDL 54.80 08/18/2021   LDLCALC 115 (H) 08/18/2021   LDLDIRECT 121.0 08/18/2021   TRIG 153.0 (H) 08/18/2021   CHOLHDL 4 08/18/2021

## 2021-10-27 ENCOUNTER — Emergency Department: Payer: Medicare PPO

## 2021-10-27 ENCOUNTER — Encounter: Payer: Self-pay | Admitting: Emergency Medicine

## 2021-10-27 ENCOUNTER — Other Ambulatory Visit: Payer: Self-pay

## 2021-10-27 ENCOUNTER — Emergency Department
Admission: EM | Admit: 2021-10-27 | Discharge: 2021-10-27 | Disposition: A | Discharge status: Home / Self Care | Payer: Medicare PPO | Attending: Emergency Medicine | Admitting: Emergency Medicine

## 2021-10-27 ENCOUNTER — Telehealth: Payer: Self-pay | Admitting: Internal Medicine

## 2021-10-27 DIAGNOSIS — K5730 Diverticulosis of large intestine without perforation or abscess without bleeding: Secondary | ICD-10-CM | POA: Diagnosis not present

## 2021-10-27 DIAGNOSIS — N2889 Other specified disorders of kidney and ureter: Secondary | ICD-10-CM | POA: Diagnosis not present

## 2021-10-27 DIAGNOSIS — I10 Essential (primary) hypertension: Secondary | ICD-10-CM | POA: Insufficient documentation

## 2021-10-27 DIAGNOSIS — R1030 Lower abdominal pain, unspecified: Secondary | ICD-10-CM | POA: Insufficient documentation

## 2021-10-27 DIAGNOSIS — J449 Chronic obstructive pulmonary disease, unspecified: Secondary | ICD-10-CM | POA: Insufficient documentation

## 2021-10-27 DIAGNOSIS — R109 Unspecified abdominal pain: Secondary | ICD-10-CM | POA: Diagnosis present

## 2021-10-27 DIAGNOSIS — R1032 Left lower quadrant pain: Secondary | ICD-10-CM | POA: Diagnosis not present

## 2021-10-27 LAB — URINALYSIS, ROUTINE W REFLEX MICROSCOPIC
Bilirubin Urine: NEGATIVE
Glucose, UA: NEGATIVE mg/dL
Ketones, ur: NEGATIVE mg/dL
Leukocytes,Ua: NEGATIVE
Nitrite: NEGATIVE
Protein, ur: NEGATIVE mg/dL
Specific Gravity, Urine: 1.008 (ref 1.005–1.030)
pH: 6 (ref 5.0–8.0)

## 2021-10-27 LAB — COMPREHENSIVE METABOLIC PANEL
ALT: 12 U/L (ref 0–44)
AST: 18 U/L (ref 15–41)
Albumin: 4.7 g/dL (ref 3.5–5.0)
Alkaline Phosphatase: 72 U/L (ref 38–126)
Anion gap: 8 (ref 5–15)
BUN: 10 mg/dL (ref 8–23)
CO2: 28 mmol/L (ref 22–32)
Calcium: 9.7 mg/dL (ref 8.9–10.3)
Chloride: 101 mmol/L (ref 98–111)
Creatinine, Ser: 0.81 mg/dL (ref 0.44–1.00)
GFR, Estimated: >60 mL/min (ref >60)
Glucose, Bld: 122 mg/dL — ABNORMAL HIGH (ref 70–99)
Potassium: 3.6 mmol/L (ref 3.5–5.1)
Sodium: 137 mmol/L (ref 135–145)
Total Bilirubin: 0.8 mg/dL (ref 0.3–1.2)
Total Protein: 7.9 g/dL (ref 6.5–8.1)

## 2021-10-27 LAB — CBC WITH DIFFERENTIAL/PLATELET
Abs Immature Granulocytes: 0.02 10*3/uL (ref 0.00–0.07)
Basophils Absolute: 0 10*3/uL (ref 0.0–0.1)
Basophils Relative: 0 %
Eosinophils Absolute: 0 10*3/uL (ref 0.0–0.5)
Eosinophils Relative: 0 %
HCT: 37.4 % (ref 36.0–46.0)
Hemoglobin: 12.5 g/dL (ref 12.0–15.0)
Immature Granulocytes: 0 %
Lymphocytes Relative: 15 %
Lymphs Abs: 1 10*3/uL (ref 0.7–4.0)
MCH: 28.5 pg (ref 26.0–34.0)
MCHC: 33.4 g/dL (ref 30.0–36.0)
MCV: 85.4 fL (ref 80.0–100.0)
Monocytes Absolute: 0.4 10*3/uL (ref 0.1–1.0)
Monocytes Relative: 6 %
Neutro Abs: 5.3 10*3/uL (ref 1.7–7.7)
Neutrophils Relative %: 79 %
Platelets: 246 10*3/uL (ref 150–400)
RBC: 4.38 MIL/uL (ref 3.87–5.11)
RDW: 12.2 % (ref 11.5–15.5)
WBC: 6.8 10*3/uL (ref 4.0–10.5)
nRBC: 0 % (ref 0.0–0.2)

## 2021-10-27 LAB — LIPASE, BLOOD: Lipase: 44 U/L (ref 11–51)

## 2021-10-27 MED ORDER — ACETAMINOPHEN 325 MG PO TABS
650.0000 mg | ORAL_TABLET | Freq: Once | ORAL | Status: AC
  Administered 2021-10-27: 650 mg via ORAL
  Filled 2021-10-27: qty 2

## 2021-10-27 MED ORDER — IOHEXOL 300 MG/ML  SOLN
75.0000 mL | Freq: Once | INTRAMUSCULAR | Status: AC | PRN
  Administered 2021-10-27: 75 mL via INTRAVENOUS

## 2021-10-27 MED ORDER — AMOXICILLIN-POT CLAVULANATE 875-125 MG PO TABS: 1.0000 | ORAL_TABLET | Freq: Two times a day (BID) | ORAL | 0 refills | Status: AC

## 2021-10-27 NOTE — Telephone Encounter (Signed)
Pt stated that she is on her way to the ED now.

## 2021-10-27 NOTE — ED Triage Notes (Signed)
Pt via POV from home. Pt c/o generalized lower abd pain and nausea for the past 3 days. Denies any vomiting. States has some watery diarrhea. Pt has hx of diverticulitis. Denies any urinary symptoms. Pt is A&Ox4 and NAD

## 2021-10-27 NOTE — ED Notes (Signed)
Patient discharged at this time. Ambulated to lobby with independent and steady gait. Breathing unlabored speaking in full sentences. Verbalized understanding of all discharge, follow up, and medication teaching. Discharged homed with all belongings.   

## 2021-10-27 NOTE — ED Provider Triage Note (Signed)
  Emergency Medicine Provider Triage Evaluation Note  Sharon Webster , a 76 y.o.female,  was evaluated in triage.  Pt complains of right lower abdominal pain.  Patient stated has been going on for the past 2 to 3 days.  She has a history of diverticulitis.  Reports 10/10 abdominal pain this morning, though is mostly subsided.  Endorses some nausea.   Review of Systems  Positive: Abdominal pain, nausea Negative: Denies fever, chest pain, vomiting  Physical Exam  There were no vitals filed for this visit. Gen:   Awake, no distress   Resp:  Normal effort  MSK:   Moves extremities without difficulty  Other:    Medical Decision Making  Given the patient's initial medical screening exam, the following diagnostic evaluation has been ordered. The patient will be placed in the appropriate treatment space, once one is available, to complete the evaluation and treatment. I have discussed the plan of care with the patient and I have advised the patient that an ED physician or mid-level practitioner will reevaluate their condition after the test results have been received, as the results may give them additional insight into the type of treatment they may need.    Diagnostics: Labs, abdominal CT, UA  Treatments: none immediately   Teodoro Spray, [place] 10/27/21 1532

## 2021-10-27 NOTE — ED Notes (Signed)
Pt complains of abd pain for the last few days denies vomiting

## 2021-10-27 NOTE — Telephone Encounter (Signed)
Pt called in stating she is having stomach issues for three days. Pt states it hurts above her belly on the right side then goes across to the left. Pt states it feels like needles are sticking in her and it also feels like she had to throw up this morning. Pt cant go to the bathroom and when she goes its diarrhea. Sent to access nurse

## 2021-10-27 NOTE — ED Provider Notes (Signed)
[facility] Provider Note    Event Date/Time   First MD Initiated Contact with Patient 10/27/21 2015     (approximate)   History   Abdominal Pain   HPI  Sharon Webster is a 76 y.o. female with a past medical history of hypertension, hyperlipidemia, COPD presents today for evaluation of lower abdominal pain and nausea.  Patient reports that this has been ongoing for the past 3 days.  She has had some loose stools.  She also reports that she has "a lot" of flatus.  No vomiting.  She reports that she has a history of diverticulosis/diverticulitis.  She has not had any urinary symptoms. She reports that she has had numerous episodes of diverticulitis in the past and this feels exactly the same.  She denies any recent antibiotics.  She denies fevers.  She most recently had a colonoscopy 1 year ago and she reports that this was normal with the exception of diverticulosis.  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 08/18/2021   Tubular adenoma of colon 04/03/2020   B12 deficiency 02/28/2019   Lactose intolerance in adult 02/26/2019   Osteopenia 10/15/2017   Essential hypertension 04/17/2017   Constipation 04/17/2017   Drug-induced vitamin B12 deficiency anemia 04/17/2017   Prediabetes 01/10/2016   Pap smear abnormality of cervix/human papillomavirus (HPV) positive 02/02/2015   Encounter for preventive health examination 01/31/2015   History of colonic polyps 01/25/2014   Personal history of colonic polyps 06/19/2013   Atypical ductal hyperplasia of breast 01/01/2013   Hepatic steatosis 10/24/2011   Familial hyperlipidemia, high LDL 10/17/2011   COPD (chronic obstructive pulmonary disease) [facility])           Physical Exam   Triage Vital Signs: ED Triage Vitals [10/27/21 1607]  Enc Vitals Group     BP (!) 162/74     Pulse Rate 78     Resp 20     Temp 98.4 F (36.9 C)     Temp Source Oral      SpO2 98 %     Weight 124 lb (56.2 kg)     Height 5' 1.5" (1.562 m)     Head Circumference      Peak Flow      Pain Score 8     Pain Loc      Pain Edu?      Excl. in [place]?     Most recent vital signs: Vitals:   10/27/21 2049 10/27/21 2159  BP: (!) 148/91 (!) 147/84  Pulse: 87 82  Resp: 17 18  Temp: 98.2 F (36.8 C)   SpO2: 96% 97%    Physical Exam Vitals and nursing note reviewed.  Constitutional:      General: Awake and alert. No acute distress.    Appearance: Normal appearance. The patient is normal weight.  HENT:     Head: Normocephalic and atraumatic.     Mouth: Mucous membranes are moist.  Eyes:     General: PERRL. Normal EOMs        Right eye: No discharge.        Left eye: No discharge.     Conjunctiva/sclera: Conjunctivae normal.  Cardiovascular:     Rate and Rhythm: Normal rate and regular rhythm.     Pulses: Normal pulses.     Heart sounds: Normal heart sounds Pulmonary:     Effort: Pulmonary effort is normal. No respiratory distress.  Breath sounds: Normal breath sounds.  Abdominal:     Abdomen is soft. There is mild left lower quadrant abdominal tenderness. No rebound or guarding. No distention. Musculoskeletal:        General: No swelling. Normal range of motion.     Cervical back: Normal range of motion and neck supple.  Skin:    General: Skin is warm and dry.     Capillary Refill: Capillary refill takes less than 2 seconds.     Findings: No rash.  Neurological:     Mental Status: The patient is awake and alert.      ED Results / Procedures / Treatments   Labs (all labs ordered are listed, but only abnormal results are displayed) Labs Reviewed  COMPREHENSIVE METABOLIC PANEL - Abnormal; Notable for the following components:      Result Value   Glucose, Bld 122 (*)    All other components within normal limits  URINALYSIS, ROUTINE W REFLEX MICROSCOPIC - Abnormal; Notable for the following components:   Color, Urine YELLOW (*)     APPearance CLEAR (*)    Hgb urine dipstick SMALL (*)    Bacteria, UA RARE (*)    All other components within normal limits  LIPASE, BLOOD  CBC WITH DIFFERENTIAL/PLATELET     EKG     RADIOLOGY I independently reviewed and interpreted imaging and agree with radiologists findings.     PROCEDURES:  Critical Care performed:   Procedures   MEDICATIONS ORDERED IN ED: Medications  acetaminophen (TYLENOL) tablet 650 mg (650 mg Oral Given 10/27/21 2046)  iohexol (OMNIPAQUE) 300 MG/ML solution 75 mL (75 mLs Intravenous Contrast Given 10/27/21 2052)     IMPRESSION / MDM / ASSESSMENT AND PLAN / ED COURSE  I reviewed the triage vital signs and the nursing notes.   Differential diagnosis includes, but is not limited to, diverticulitis, gastroenteritis, appendicitis, gallbladder disease, abscess, perforation.  Patient is awake alert, hemodynamically stable and afebrile.  Labs obtained are overall reassuring.  Urinalysis without overt signs of UTI.  CT scan was obtained for evaluation of diverticulitis or other intra-abdominal process.  She was treated symptomatically with Tylenol.  CT scan reveals diverticulosis without evidence of diverticulitis.  No other intra-abdominal findings.  Patient reports that she has had diverticulitis numerous times and feels confident that that is what her symptoms today feel like and she is requesting treatment.  Given her left lower quadrant tenderness, her loose stools, this is not unreasonable.  She has no blood in her stools.  She was unable to give a stool sample during the 7 hours that she was in the emergency department, therefore low suspicion for C. difficile.  Additionally, she has not been on any recent antibiotics.  We discussed strict return precautions and the importance of close outpatient follow-up.  She agrees to call her PCP tomorrow morning.  She understands return precautions.  She was discharged in stable condition.   Patient's  presentation is most consistent with acute complicated illness / injury requiring diagnostic workup.   FINAL CLINICAL IMPRESSION(S) / ED DIAGNOSES   Final diagnoses:  Lower abdominal pain     Rx / DC Orders   ED Discharge Orders          Ordered    amoxicillin-clavulanate (AUGMENTIN) 875-125 MG tablet  2 times daily        10/27/21 2140             Note:  This document was prepared using Dragon voice  recognition software and may include unintentional dictation errors.   Emeline Gins 10/27/21 2255    Blake Divine, MD 10/30/21 [phone]

## 2021-10-27 NOTE — Discharge Instructions (Signed)
Take the antibiotics as prescribed.  Please follow-up with your outpatient provider this week.  Please return to the emergency department immediately for any new, worsening, or change in symptoms or other concerns.  It was a pleasure caring for you today.

## 2021-10-27 NOTE — Telephone Encounter (Signed)
Patient returned our call.  I transferred patient's call to Adair Laundry, [place]ann[place].

## 2021-11-01 ENCOUNTER — Other Ambulatory Visit: Payer: Self-pay | Admitting: Internal Medicine

## 2021-11-04 ENCOUNTER — Ambulatory Visit: Payer: Medicare PPO | Admitting: Internal Medicine

## 2021-11-04 ENCOUNTER — Ambulatory Visit (INDEPENDENT_AMBULATORY_CARE_PROVIDER_SITE_OTHER): Payer: Medicare PPO

## 2021-11-04 ENCOUNTER — Encounter: Payer: Self-pay | Admitting: Internal Medicine

## 2021-11-04 VITALS — BP 120/62 | HR 82 | Temp 98.4°F | Resp 14 | Ht 61.5 in | Wt 121.0 lb

## 2021-11-04 DIAGNOSIS — E7849 Other hyperlipidemia: Secondary | ICD-10-CM

## 2021-11-04 DIAGNOSIS — R102 Pelvic and perineal pain: Secondary | ICD-10-CM | POA: Diagnosis not present

## 2021-11-04 DIAGNOSIS — K5900 Constipation, unspecified: Secondary | ICD-10-CM

## 2021-11-04 DIAGNOSIS — R109 Unspecified abdominal pain: Secondary | ICD-10-CM | POA: Diagnosis not present

## 2021-11-04 LAB — CBC WITH DIFFERENTIAL/PLATELET
Basophils Absolute: 0 10*3/uL (ref 0.0–0.1)
Basophils Relative: 0.8 % (ref 0.0–3.0)
Eosinophils Absolute: 0 10*3/uL (ref 0.0–0.7)
Eosinophils Relative: 0.7 % (ref 0.0–5.0)
HCT: 36 % (ref 36.0–46.0)
Hemoglobin: 12.2 g/dL (ref 12.0–15.0)
Lymphocytes Relative: 27.8 % (ref 12.0–46.0)
Lymphs Abs: 1.7 10*3/uL (ref 0.7–4.0)
MCHC: 34 g/dL (ref 30.0–36.0)
MCV: 83.9 fl (ref 78.0–100.0)
Monocytes Absolute: 0.4 10*3/uL (ref 0.1–1.0)
Monocytes Relative: 7.3 % (ref 3.0–12.0)
Neutro Abs: 3.9 10*3/uL (ref 1.4–7.7)
Neutrophils Relative %: 63.4 % (ref 43.0–77.0)
Platelets: 249 10*3/uL (ref 150.0–400.0)
RBC: 4.29 Mil/uL (ref 3.87–5.11)
RDW: 12.7 % (ref 11.5–15.5)
WBC: 6.1 10*3/uL (ref 4.0–10.5)

## 2021-11-04 LAB — URINALYSIS
Bilirubin Urine: NEGATIVE
Ketones, ur: NEGATIVE
Leukocytes,Ua: NEGATIVE
Nitrite: NEGATIVE
Specific Gravity, Urine: 1.01 (ref 1.000–1.030)
Total Protein, Urine: NEGATIVE
Urine Glucose: NEGATIVE
Urobilinogen, UA: 0.2 (ref 0.0–1.0)
pH: 6 (ref 5.0–8.0)

## 2021-11-04 LAB — C-REACTIVE PROTEIN: CRP: <1 mg/dL (ref 0.5–20.0)

## 2021-11-04 LAB — SEDIMENTATION RATE: Sed Rate: 6 mm/hr (ref 0–30)

## 2021-11-04 NOTE — Progress Notes (Signed)
Subjective:  Patient ID: Sharon Webster, female    DOB: 09-May-1945  Age: 76 y.o. MRN: [id number]  CC: The primary encounter diagnosis was Suprapubic abdominal pain. Diagnoses of Familial hyperlipidemia, high LDL and Constipation, unspecified constipation type were also pertinent to this visit.   HPI Sharon Webster presents for  Chief Complaint  Patient presents with   Hospitalization Follow-up    [facility] ED 10/27/21 for lower abdominal pain. Labs & CT done at hospital. Pt still c/o discomfort in abdomen but no pain. Denies any N/V, having loss stools in the AM & excessive gas.    76 yr old female with history of diverticulosis,  chronic constipation,  prior episodes of diverticulitis presents for ER follow up. Evaluated in ER on August 15 for  LLQ pain and loose stools concerning for recurrent diverticulitis.   Symptoms started with constipation while vacationing at the beach accompanied by lower abdominal pain. Upon returning home,  her bowels became loose but the pain did not improve.   She was directed to the ER and waited 9 hours to be seen.    Reviewed ER visit including CT scan with contrast and labs.  Diverticulosis without itis noted,  all labs normal including lipase and UA , however she requested tx for "itis" and was givnen  Augmentin x 7 days   Last dose of augmentin was yesterday.  Has been eating soup,  liquids.  Pain has improved and bowels are becoming more solid.  Still having some suprapubic pain described as improved,  a dull ache,   and bowel moving 2 to 3 times daily , stools are soft and slightly liquid.  No fevers.     Outpatient Medications Prior to Visit  Medication Sig Dispense Refill   cholecalciferol (VITAMIN D3) 25 MCG (1000 UT) tablet Take 1,000 Units by mouth daily.     dicyclomine (BENTYL) 20 MG tablet TAKE 1 TABLET(20 MG) BY MOUTH THREE TIMES DAILY BEFORE MEALS 270 tablet 1   hydrochlorothiazide (HYDRODIURIL) 12.5 MG tablet TAKE 1 TABLET(12.5 MG) BY  MOUTH DAILY 90 tablet 1   pantoprazole (PROTONIX) 40 MG tablet TAKE 1 TABLET(40 MG) BY MOUTH DAILY 90 tablet 1   vitamin B-12 (CYANOCOBALAMIN) 1000 MCG tablet Take 1,000 mcg by mouth daily.     atorvastatin (LIPITOR) 20 MG tablet Take 1 tablet (20 mg total) by mouth daily. 90 tablet 1   pravastatin (PRAVACHOL) 20 MG tablet TAKE 1 TABLET(20 MG) BY MOUTH DAILY 90 tablet 1   No facility-administered medications prior to visit.    Review of Systems;  Patient denies headache, fevers, malaise, unintentional weight loss, skin rash, eye pain, sinus congestion and sinus pain, sore throat, dysphagia,  hemoptysis , cough, dyspnea, wheezing, chest pain, palpitations, orthopnea, edema, abdominal pain, nausea, melena, diarrhea, constipation, flank pain, dysuria, hematuria, urinary  Frequency, nocturia, numbness, tingling, seizures,  Focal weakness, Loss of consciousness,  Tremor, insomnia, depression, anxiety, and suicidal ideation.      Objective:  BP 120/62 (BP Location: Left Arm, Patient Position: Sitting, Cuff Size: Normal)   Pulse 82   Temp 98.4 F (36.9 C) (Oral)   Resp 14   Ht 5' 1.5" (1.562 m)   Wt 121 lb (54.9 kg)   SpO2 99%   BMI 22.49 kg/m   BP Readings from Last 3 Encounters:  11/04/21 120/62  10/27/21 (!) 147/84  08/18/21 136/72    Wt Readings from Last 3 Encounters:  11/04/21 121 lb (54.9 kg)  10/27/21 124 lb (56.2 kg)  08/18/21 128 lb 6.4 oz (58.2 kg)    General appearance: alert, cooperative and appears stated age Ears: normal TM's and external ear canals both ears Throat: lips, mucosa, and tongue normal; teeth and gums normal Neck: no adenopathy, no carotid bruit, supple, symmetrical, trachea midline and thyroid not enlarged, symmetric, no tenderness/mass/nodules Back: symmetric, no curvature. ROM normal. No CVA tenderness. Lungs: clear to auscultation bilaterally Heart: regular rate and rhythm, S1, S2 normal, no murmur, click, rub or gallop Abdomen: soft, tender  without guarding in the suprapubic area mand LLQ ,  bowel sounds normal; no masses,  no organomegaly Pulses: 2+ and symmetric Skin: Skin color, texture, turgor normal. No rashes or lesions Lymph nodes: Cervical, supraclavicular, and axillary nodes normal.  Lab Results  Component Value Date   HGBA1C 6.1 08/18/2021   HGBA1C 6.0 02/28/2019   HGBA1C 5.9 04/17/2018    Lab Results  Component Value Date   CREATININE 0.81 10/27/2021   CREATININE 0.88 08/18/2021   CREATININE 0.83 04/15/2020    Lab Results  Component Value Date   WBC 6.8 10/27/2021   HGB 12.5 10/27/2021   HCT 37.4 10/27/2021   PLT 246 10/27/2021   GLUCOSE 122 (H) 10/27/2021   CHOL 200 08/18/2021   TRIG 153.0 (H) 08/18/2021   HDL 54.80 08/18/2021   LDLDIRECT 121.0 08/18/2021   LDLCALC 115 (H) 08/18/2021   ALT 12 10/27/2021   AST 18 10/27/2021   NA 137 10/27/2021   K 3.6 10/27/2021   CL 101 10/27/2021   CREATININE 0.81 10/27/2021   BUN 10 10/27/2021   CO2 28 10/27/2021   TSH 1.33 08/18/2021   HGBA1C 6.1 08/18/2021   MICROALBUR <0.7 08/18/2021    CT Abdomen Pelvis W Contrast  Result Date: 10/27/2021 CLINICAL DATA:  Right-sided abdominal pain for few days, initial encounter EXAM: CT ABDOMEN AND PELVIS WITH CONTRAST TECHNIQUE: Multidetector CT imaging of the abdomen and pelvis was performed using the standard protocol following bolus administration of intravenous contrast. RADIATION DOSE REDUCTION: This exam was performed according to the departmental dose-optimization program which includes automated exposure control, adjustment of the mA and/or kV according to patient size and/or use of iterative reconstruction technique. CONTRAST:  70m OMNIPAQUE IOHEXOL 300 MG/ML  SOLN COMPARISON:  10/22/2011 FINDINGS: Lower chest: No acute abnormality. Hepatobiliary: No focal liver abnormality is seen. No gallstones, gallbladder wall thickening, or biliary dilatation. Pancreas: Unremarkable. No pancreatic ductal dilatation or  surrounding inflammatory changes. Spleen: Normal in size without focal abnormality. Adrenals/Urinary Tract: Adrenal glands are within normal limits. Kidneys are well visualized bilaterally. Multiple hypodense lesions are again identified consistent with simple cysts. No further follow-up is recommended. No renal calculi are seen. Normal excretion is noted on delayed images. Bladder is partially distended. Stomach/Bowel: Diverticular change of the colon is noted. The appendix is within normal limits. No obstructive or inflammatory changes of the small bowel are noted. Stomach is within normal limits. Vascular/Lymphatic: Aortic atherosclerosis. No enlarged abdominal or pelvic lymph nodes. Reproductive: Uterus and bilateral adnexa are unremarkable. Other: Fat containing umbilical hernia is noted. No free pelvic fluid is seen. Musculoskeletal: No acute or significant osseous findings. IMPRESSION: Diverticulosis without diverticulitis. Normal-appearing appendix. No other focal abnormality is noted. Electronically Signed   By: MInez CatalinaM.D.   On: 10/27/2021 21:14    Assessment & Plan:   Problem List Items Addressed This Visit     Constipation    Chronic,  Became problematic while vacationing at the beach,  Leading to LLQ pain reminiscent of  diverticulitis. However CT was normal and CBC was normal.  She was treated empirically by ER with augmentin       Familial hyperlipidemia, high LDL    She was changed to high potency statin but the pharmacy refilled her pravastatin,  Which cost her $22.  Will allow her to substitute pravastatin at 40 mg dose (2 tablets ) daily instead of refilling the atorvastatin instead of refilling ,  For 45 days       Suprapubic abdominal pain - Primary    Treated for diverticulitis on August 15 by ER despite normal CT . Symptoms improved but not resolved.  Dull ache,  Increased gas,  And soft stools.  Checking for UTI given recent diarrhea,  ESR , cRP and CBC a/w/s plain abd  films       Relevant Orders   Urinalysis   Urine Culture   C-reactive protein   Sedimentation rate   CBC with Differential/Platelet   DG Abd 1 View    I spent a total of  30 minutes with this patient in a face to face visit on the date of this encounter reviewing her August 15 ER visit, patient's current  diet and eating habits, ,  most recent imaging study ,   and post visit ordering of testing and therapeutics.    Follow-up: No follow-ups on file.   Crecencio Mc, MD

## 2021-11-04 NOTE — Assessment & Plan Note (Signed)
She was changed to high potency statin but the pharmacy refilled her pravastatin,  Which cost her $22.  Will allow her to substitute pravastatin at 40 mg dose (2 tablets ) daily instead of refilling the atorvastatin instead of refilling ,  For 45 days

## 2021-11-04 NOTE — Assessment & Plan Note (Addendum)
Chronic,  Became problematic while vacationing at the beach,  Leading to LLQ pain reminiscent of diverticulitis. However CT was normal and CBC was normal.  She was treated empirically by ER with augmentin

## 2021-11-04 NOTE — Assessment & Plan Note (Signed)
Treated for diverticulitis on August 15 by ER despite normal CT . Symptoms improved but not resolved.  Dull ache,  Increased gas,  And soft stools.  Checking for UTI given recent diarrhea,  ESR , cRP and CBC a/w/s plain abd films

## 2021-11-04 NOTE — Patient Instructions (Addendum)
Labs today are Checking to see if the inflammation has resolved and rule out a UTI  If the drug store will not accept the pravastatin,  you can still use it!  Just take  40 mg  daily  ( 2 tablets) when you have finished  your current atorvastatin bottle,  instead of refilling atorvastatin    This will last you 45 days  then you will need to request the atorvastatin to be refilled   Please take a probiotic ( Align, Floraque or Culturelle) of the generic version of one of these  For a minimum of 3 weeks to prevent a serious antibiotic associated diarrhea  Called clostridium dificile colitis  (or eat yogurt daily for 3 weeks )

## 2021-11-05 LAB — URINE CULTURE
MICRO NUMBER:: 13820623
Result:: NO GROWTH
SPECIMEN QUALITY:: ADEQUATE

## 2021-11-10 ENCOUNTER — Telehealth: Payer: Self-pay | Admitting: Internal Medicine

## 2021-11-10 ENCOUNTER — Other Ambulatory Visit: Payer: Self-pay | Admitting: Internal Medicine

## 2021-11-10 MED ORDER — ONDANSETRON 4 MG PO TBDP: 4.0000 mg | ORAL_TABLET | Freq: Three times a day (TID) | ORAL | 0 refills | Status: DC | PRN

## 2021-11-10 NOTE — Telephone Encounter (Signed)
Patient has been to the hospital, and saw Dr Derrel Nip last week. She is still having stomach pain for over 2 weeks and can not eat. She would like to know what she should do next.

## 2021-11-11 ENCOUNTER — Other Ambulatory Visit: Payer: Self-pay

## 2021-11-11 ENCOUNTER — Emergency Department
Admission: EM | Admit: 2021-11-11 | Discharge: 2021-11-11 | Disposition: A | Discharge status: Home / Self Care | Payer: Medicare PPO | Attending: Emergency Medicine | Admitting: Emergency Medicine

## 2021-11-11 ENCOUNTER — Encounter: Payer: Self-pay | Admitting: *Deleted

## 2021-11-11 ENCOUNTER — Emergency Department: Payer: Medicare PPO

## 2021-11-11 DIAGNOSIS — R1110 Vomiting, unspecified: Secondary | ICD-10-CM | POA: Diagnosis not present

## 2021-11-11 DIAGNOSIS — J449 Chronic obstructive pulmonary disease, unspecified: Secondary | ICD-10-CM | POA: Diagnosis not present

## 2021-11-11 DIAGNOSIS — R8289 Other abnormal findings on cytological and histological examination of urine: Secondary | ICD-10-CM | POA: Diagnosis not present

## 2021-11-11 DIAGNOSIS — R1031 Right lower quadrant pain: Secondary | ICD-10-CM | POA: Insufficient documentation

## 2021-11-11 DIAGNOSIS — I700 Atherosclerosis of aorta: Secondary | ICD-10-CM | POA: Diagnosis not present

## 2021-11-11 DIAGNOSIS — E876 Hypokalemia: Secondary | ICD-10-CM | POA: Insufficient documentation

## 2021-11-11 DIAGNOSIS — R1030 Lower abdominal pain, unspecified: Secondary | ICD-10-CM

## 2021-11-11 DIAGNOSIS — R6883 Chills (without fever): Secondary | ICD-10-CM | POA: Diagnosis not present

## 2021-11-11 DIAGNOSIS — R109 Unspecified abdominal pain: Secondary | ICD-10-CM | POA: Diagnosis not present

## 2021-11-11 LAB — CBC WITH DIFFERENTIAL/PLATELET
Abs Immature Granulocytes: 0.01 10*3/uL (ref 0.00–0.07)
Basophils Absolute: 0 10*3/uL (ref 0.0–0.1)
Basophils Relative: 1 %
Eosinophils Absolute: 0 10*3/uL (ref 0.0–0.5)
Eosinophils Relative: 0 %
HCT: 36.5 % (ref 36.0–46.0)
Hemoglobin: 12.2 g/dL (ref 12.0–15.0)
Immature Granulocytes: 0 %
Lymphocytes Relative: 21 %
Lymphs Abs: 1.2 10*3/uL (ref 0.7–4.0)
MCH: 28.2 pg (ref 26.0–34.0)
MCHC: 33.4 g/dL (ref 30.0–36.0)
MCV: 84.3 fL (ref 80.0–100.0)
Monocytes Absolute: 0.5 10*3/uL (ref 0.1–1.0)
Monocytes Relative: 8 %
Neutro Abs: 4.1 10*3/uL (ref 1.7–7.7)
Neutrophils Relative %: 70 %
Platelets: 259 10*3/uL (ref 150–400)
RBC: 4.33 MIL/uL (ref 3.87–5.11)
RDW: 12.2 % (ref 11.5–15.5)
WBC: 5.8 10*3/uL (ref 4.0–10.5)
nRBC: 0 % (ref 0.0–0.2)

## 2021-11-11 LAB — URINALYSIS, ROUTINE W REFLEX MICROSCOPIC
Bacteria, UA: NONE SEEN
Bilirubin Urine: NEGATIVE
Glucose, UA: NEGATIVE mg/dL
Ketones, ur: NEGATIVE mg/dL
Leukocytes,Ua: NEGATIVE
Nitrite: NEGATIVE
Protein, ur: NEGATIVE mg/dL
Specific Gravity, Urine: 1.008 (ref 1.005–1.030)
pH: 7 (ref 5.0–8.0)

## 2021-11-11 LAB — COMPREHENSIVE METABOLIC PANEL
ALT: 13 U/L (ref 0–44)
AST: 21 U/L (ref 15–41)
Albumin: 4.6 g/dL (ref 3.5–5.0)
Alkaline Phosphatase: 65 U/L (ref 38–126)
Anion gap: 10 (ref 5–15)
BUN: 9 mg/dL (ref 8–23)
CO2: 28 mmol/L (ref 22–32)
Calcium: 9.8 mg/dL (ref 8.9–10.3)
Chloride: 96 mmol/L — ABNORMAL LOW (ref 98–111)
Creatinine, Ser: 0.78 mg/dL (ref 0.44–1.00)
GFR, Estimated: >60 mL/min (ref >60)
Glucose, Bld: 137 mg/dL — ABNORMAL HIGH (ref 70–99)
Potassium: 3.2 mmol/L — ABNORMAL LOW (ref 3.5–5.1)
Sodium: 134 mmol/L — ABNORMAL LOW (ref 135–145)
Total Bilirubin: 0.9 mg/dL (ref 0.3–1.2)
Total Protein: 7.6 g/dL (ref 6.5–8.1)

## 2021-11-11 LAB — LIPASE, BLOOD: Lipase: 40 U/L (ref 11–51)

## 2021-11-11 MED ORDER — IOHEXOL 300 MG/ML  SOLN
100.0000 mL | Freq: Once | INTRAMUSCULAR | Status: AC | PRN
  Administered 2021-11-11: 100 mL via INTRAVENOUS

## 2021-11-11 MED ORDER — ALUM & MAG HYDROXIDE-SIMETH 200-200-20 MG/5ML PO SUSP
30.0000 mL | Freq: Once | ORAL | Status: AC
  Administered 2021-11-11: 30 mL via ORAL
  Filled 2021-11-11: qty 30

## 2021-11-11 MED ORDER — POTASSIUM CHLORIDE CRYS ER 20 MEQ PO TBCR
40.0000 meq | EXTENDED_RELEASE_TABLET | Freq: Once | ORAL | Status: AC
  Administered 2021-11-11: 40 meq via ORAL
  Filled 2021-11-11: qty 2

## 2021-11-11 MED ORDER — FAMOTIDINE IN NACL 20-0.9 MG/50ML-% IV SOLN
20.0000 mg | Freq: Once | INTRAVENOUS | Status: AC
  Administered 2021-11-11: 20 mg via INTRAVENOUS
  Filled 2021-11-11: qty 50

## 2021-11-11 NOTE — Telephone Encounter (Signed)
Patient states she was in ED (10/27/2021), followed up with Dr. Deborra Medina (11/04/2021), and she is not getting any better.  Patient states she is experiencing burning in her gut area, belching, passing gas, and nausea.  Patient states she was up all night because this kept her from sleeping.  Patient states she just ate some applesauce, then she threw it up.  Patient's call was transferred to Access Nurse.

## 2021-11-11 NOTE — Telephone Encounter (Signed)
Pt was advised that she would need to go to the ED.

## 2021-11-11 NOTE — ED Triage Notes (Signed)
Pt reports burning sensation in abdomen.  Pt vomited this am.  No diarrhea.  Decreased appetite.  Pt alert  speech clear.

## 2021-11-11 NOTE — ED Notes (Signed)
Pt A&O, IV removed, pt given discharge instructions, pt ambulating with steady gait. 

## 2021-11-11 NOTE — ED Provider Triage Note (Signed)
  Emergency Medicine Provider Triage Evaluation Note  Sharon Webster , a 76 y.o.female,  was evaluated in triage.  Pt complains of persistent nausea/vomiting, diarrhea, and abdominal cramping x1 week.  She states that she was seen last week for the same symptoms, but was reportedly unable to determine what the cause was.  No new symptoms.   Review of Systems  Positive: Vomiting, diarrhea, abdominal cramping Negative: Denies fever, chest pain, vomiting  Physical Exam  There were no vitals filed for this visit. Gen:   Awake, no distress   Resp:  Normal effort  MSK:   Moves extremities without difficulty  Other:    Medical Decision Making  Given the patient's initial medical screening exam, the following diagnostic evaluation has been ordered. The patient will be placed in the appropriate treatment space, once one is available, to complete the evaluation and treatment. I have discussed the plan of care with the patient and I have advised the patient that an ED physician or mid-level practitioner will reevaluate their condition after the test results have been received, as the results may give them additional insight into the type of treatment they may need.    Diagnostics: Labs, UA  Treatments: none immediately   Teodoro Spray, [place] 11/11/21 1458

## 2021-11-11 NOTE — ED Provider Notes (Signed)
[facility] Provider Note  Patient Contact: 5:19 PM (approximate)   History   Abdominal Pain   HPI  Sharon Webster is a 76 y.o. female with a history of COPD, GERD and diverticulosis, presents to the emergency department with burning right lower quadrant abdominal pain and vomiting that started today.  Patient states that she has had lower abdominal pain off-and-on for the past 3 weeks.  She had a CT abdomen pelvis in this emergency department on 815 which indicated diverticulosis without diverticulitis.  She states that she has been on a mostly liquid diet as directed by her primary care provider but did have some toast and applesauce earlier in the day.  She states that she has had some chills here but no fever at home.  She states that she took some Pepto-Bismol and noticed some change in the caliber of her stool.  She denies chest pain, chest tightness or shortness of breath.      Physical Exam   Triage Vital Signs: ED Triage Vitals  Enc Vitals Group     BP 11/11/21 1507 (!) 155/69     Pulse Rate 11/11/21 1503 77     Resp 11/11/21 1503 16     Temp 11/11/21 1503 98.5 F (36.9 C)     Temp Source 11/11/21 1503 Oral     SpO2 11/11/21 1503 96 %     Weight 11/11/21 1508 120 lb (54.4 kg)     Height 11/11/21 1508 '5\' 2"'$  (1.575 m)     Head Circumference --      Peak Flow --      Pain Score 11/11/21 1507 9     Pain Loc --      Pain Edu? --      Excl. in [place]? --     Most recent vital signs: Vitals:   11/11/21 1652 11/11/21 1937  BP: (!) 169/72 (!) 142/90  Pulse: 68 79  Resp: 18 16  Temp:  97.9 F (36.6 C)  SpO2: 95% 95%     General: Alert and in no acute distress. Eyes:  PERRL. EOMI. Head: No acute traumatic findings ENT:      Nose: No congestion/rhinnorhea.      Mouth/Throat: Mucous membranes are moist. Neck: No stridor. No cervical spine tenderness to palpation. Cardiovascular:  Good peripheral perfusion Respiratory: Normal respiratory  effort without tachypnea or retractions. Lungs CTAB. Good air entry to the bases with no decreased or absent breath sounds. Gastrointestinal: Bowel sounds 4 quadrants. Soft and tender to palpation in RLQ. No guarding or rigidity. No palpable masses. No distention. No CVA tenderness. Musculoskeletal: Full range of motion to all extremities.  Neurologic:  No gross focal neurologic deficits are appreciated.  Skin:   No rash noted Other:   ED Results / Procedures / Treatments   Labs (all labs ordered are listed, but only abnormal results are displayed) Labs Reviewed  COMPREHENSIVE METABOLIC PANEL - Abnormal; Notable for the following components:      Result Value   Sodium 134 (*)    Potassium 3.2 (*)    Chloride 96 (*)    Glucose, Bld 137 (*)    All other components within normal limits  URINALYSIS, ROUTINE W REFLEX MICROSCOPIC - Abnormal; Notable for the following components:   Color, Urine YELLOW (*)    APPearance CLEAR (*)    Hgb urine dipstick SMALL (*)    All other components within normal limits  LIPASE, BLOOD  CBC WITH DIFFERENTIAL/PLATELET  RADIOLOGY  I personally viewed and evaluated these images as part of my medical decision making, as well as reviewing the written report by the radiologist.  ED Provider Interpretation: CT abdomen pelvis unremarkable.   PROCEDURES:  Critical Care performed: No  Procedures   MEDICATIONS ORDERED IN ED: Medications  potassium chloride SA (KLOR-CON M) CR tablet 40 mEq (40 mEq Oral Given 11/11/21 1650)  iohexol (OMNIPAQUE) 300 MG/ML solution 100 mL (100 mLs Intravenous Contrast Given 11/11/21 1754)  alum & mag hydroxide-simeth (MAALOX/MYLANTA) 200-200-20 MG/5ML suspension 30 mL (30 mLs Oral Given 11/11/21 1905)  famotidine (PEPCID) IVPB 20 mg premix (0 mg Intravenous Stopped 11/11/21 1935)     IMPRESSION / MDM / ASSESSMENT AND PLAN / ED COURSE  I reviewed the triage vital signs and the nursing notes.                               Assessment and plan Abdominal pain:  76 year old female presents to the emergency department with burning right lower quadrant abdominal pain that is worsening and density over the past 24 hours.  Patient was hypertensive at triage but vital signs were otherwise reassuring.  She was alert and nontoxic-appearing.  Her abdomen was soft and tender in the right lower quadrant she had no guarding.  Differential diagnosis includes at this time small bowel obstruction, appendicitis, diverticulitis....   Patient had mild hypokalemia on CMP but was otherwise reassuring.  CBC and lipase within range.  Urinalysis indicates a small amount of blood but no other findings suggestive of UTI.  CT abdomen pelvis unremarkable.  Patient was given GI cocktail and Pepcid prior to discharge.  Recommended follow-up with primary care as needed.  Patient did communicate that she has been very lonely at home.  We talked about ways to socialize and become involved in the community. FINAL CLINICAL IMPRESSION(S) / ED DIAGNOSES   Final diagnoses:  Lower abdominal pain     Rx / DC Orders   ED Discharge Orders     None        Note:  This document was prepared using Dragon voice recognition software and may include unintentional dictation errors.   Vallarie Mare Holdrege, PA-C 11/11/21 2229    Rada Hay, MD 11/12/21 1725

## 2021-11-11 NOTE — Telephone Encounter (Signed)
Pt is currently at the ED

## 2021-11-18 ENCOUNTER — Ambulatory Visit: Payer: Medicare PPO | Admitting: Internal Medicine

## 2021-11-18 ENCOUNTER — Encounter: Payer: Self-pay | Admitting: Internal Medicine

## 2021-11-18 VITALS — BP 148/66 | HR 61 | Temp 98.1°F | Ht 62.0 in | Wt 119.6 lb

## 2021-11-18 DIAGNOSIS — R1011 Right upper quadrant pain: Secondary | ICD-10-CM | POA: Insufficient documentation

## 2021-11-18 DIAGNOSIS — E876 Hypokalemia: Secondary | ICD-10-CM

## 2021-11-18 DIAGNOSIS — N2889 Other specified disorders of kidney and ureter: Secondary | ICD-10-CM | POA: Diagnosis not present

## 2021-11-18 LAB — MAGNESIUM: Magnesium: 1.9 mg/dL (ref 1.5–2.5)

## 2021-11-18 LAB — BASIC METABOLIC PANEL
BUN: 11 mg/dL (ref 6–23)
CO2: 30 mEq/L (ref 19–32)
Calcium: 9.4 mg/dL (ref 8.4–10.5)
Chloride: 99 mEq/L (ref 96–112)
Creatinine, Ser: 0.89 mg/dL (ref 0.40–1.20)
GFR: 63.49 mL/min (ref >60.00)
Glucose, Bld: 92 mg/dL (ref 70–99)
Potassium: 3.4 mEq/L — ABNORMAL LOW (ref 3.5–5.1)
Sodium: 138 mEq/L (ref 135–145)

## 2021-11-18 LAB — C-REACTIVE PROTEIN: CRP: <1 mg/dL (ref 0.5–20.0)

## 2021-11-18 NOTE — Patient Instructions (Addendum)
Stay on the probiotic for 3 weeks from the time you used the antibiotics   Continue the mylanta and the prevacid  I have ordered an ultrasound of your liver and gallbladder to be done at [facility]  Avoid high fat foods to avoid aggravating your pain

## 2021-11-18 NOTE — Assessment & Plan Note (Signed)
With a 9 lb weight loss since June due to altered appetite and post prandial pain  RUQ [place] ordered given the 0.8 cm mass near the GB neck.  Continue mylanta and prevacid since her symptoms have improved on this regeim

## 2021-11-18 NOTE — Assessment & Plan Note (Signed)
Incidental finding on August 30 CT of abdomen.  Reviewed with patient.  Will need MRI at a later date once GI issues have been clarified

## 2021-11-18 NOTE — Progress Notes (Signed)
Subjective:  Patient ID: Sharon Webster, female    DOB: 08/18/1945  Age: 76 y.o. MRN: [id number]  CC: The primary encounter diagnosis was Postprandial RUQ pain. Diagnoses of Hypokalemia, Bilateral renal masses, and Colicky RUQ abdominal pain were also pertinent to this visit.   HPI Sharon Webster presents for 2nd ER follow up Chief Complaint  Patient presents with   Follow-up    Follow up from the ED   76 yr old female seen after ER visit august 15 for diverticulosis (not itis,  per CT )  with persistent .  She was evaluated for second time in ER on August 3,,  CT abd and pelvis unremarkable.  Was given mylanta and prevacid.    She states that she felt much better  for two days , but symptoms have returned somewhat .  Post prandial right sided pain .  Worse after fatty meals   Lots of gas at night.   9 lb weight loss since June   Drinks mylanta every morning and prevacid after dinner,  at 7 pm     Results of CT Scan:  IMPRESSION: 1. No acute abnormality in the abdomen or pelvis. Specifically, no findings of appendicitis or bowel obstruction. 2. A 0.8 cm hyperdensity at the gallbladder neck could represent a gallstone. Of note, there are no CT findings to suggest acute cholecystitis. Recommend further evaluation with ultrasound of the right upper abdomen. 3. Two indeterminate bilateral renal lesions. When the patient is clinically stable and able to follow directions and hold their breath (preferably as an outpatient), further evaluation with dedicated abdominal MRI should be considered.   Outpatient Medications Prior to Visit  Medication Sig Dispense Refill   Calcium & Magnesium Carbonates (MYLANTA PO) Take by mouth.     cholecalciferol (VITAMIN D3) 25 MCG (1000 UT) tablet Take 1,000 Units by mouth daily.     dicyclomine (BENTYL) 20 MG tablet TAKE 1 TABLET(20 MG) BY MOUTH THREE TIMES DAILY BEFORE MEALS 270 tablet 1   hydrochlorothiazide (HYDRODIURIL) 12.5 MG tablet TAKE  1 TABLET(12.5 MG) BY MOUTH DAILY 90 tablet 1   lansoprazole (PREVACID) 30 MG capsule Take 30 mg by mouth daily at 12 noon.     ondansetron (ZOFRAN-ODT) 4 MG disintegrating tablet Take 1 tablet (4 mg total) by mouth every 8 (eight) hours as needed for nausea or vomiting. 20 tablet 0   pantoprazole (PROTONIX) 40 MG tablet TAKE 1 TABLET(40 MG) BY MOUTH DAILY 90 tablet 1   vitamin B-12 (CYANOCOBALAMIN) 1000 MCG tablet Take 1,000 mcg by mouth daily.     No facility-administered medications prior to visit.    Review of Systems;  Patient denies headache, fevers, malaise, unintentional weight loss, skin rash, eye pain, sinus congestion and sinus pain, sore throat, dysphagia,  hemoptysis , cough, dyspnea, wheezing, chest pain, palpitations, orthopnea, edema, abdominal pain, nausea, melena, diarrhea, constipation, flank pain, dysuria, hematuria, urinary  Frequency, nocturia, numbness, tingling, seizures,  Focal weakness, Loss of consciousness,  Tremor, insomnia, depression, anxiety, and suicidal ideation.      Objective:  BP (!) 148/66 (BP Location: Left Arm, Patient Position: Sitting, Cuff Size: Normal)   Pulse 61   Temp 98.1 F (36.7 C) (Oral)   Ht '5\' 2"'$  (1.575 m)   Wt 119 lb 9.6 oz (54.3 kg)   SpO2 99%   BMI 21.88 kg/m   BP Readings from Last 3 Encounters:  11/18/21 (!) 148/66  11/11/21 (!) 142/90  11/04/21 120/62    Wt Readings from  Last 3 Encounters:  11/18/21 119 lb 9.6 oz (54.3 kg)  11/11/21 120 lb (54.4 kg)  11/04/21 121 lb (54.9 kg)    General appearance: alert, cooperative and appears stated age Ears: normal TM's and external ear canals both ears Throat: lips, mucosa, and tongue normal; teeth and gums normal Neck: no adenopathy, no carotid bruit, supple, symmetrical, trachea midline and thyroid not enlarged, symmetric, no tenderness/mass/nodules Back: symmetric, no curvature. ROM normal. No CVA tenderness. Lungs: clear to auscultation bilaterally Heart: regular rate and  rhythm, S1, S2 normal, no murmur, click, rub or gallop Abdomen: soft, tender in the periumbilic area on the right  no masses,  no organomegaly Pulses: 2+ and symmetric Skin: Skin color, texture, turgor normal. No rashes or lesions Lymph nodes: Cervical, supraclavicular, and axillary nodes normal.  Lab Results  Component Value Date   HGBA1C 6.1 08/18/2021   HGBA1C 6.0 02/28/2019   HGBA1C 5.9 04/17/2018    Lab Results  Component Value Date   CREATININE 0.78 11/11/2021   CREATININE 0.81 10/27/2021   CREATININE 0.88 08/18/2021    Lab Results  Component Value Date   WBC 5.8 11/11/2021   HGB 12.2 11/11/2021   HCT 36.5 11/11/2021   PLT 259 11/11/2021   GLUCOSE 137 (H) 11/11/2021   CHOL 200 08/18/2021   TRIG 153.0 (H) 08/18/2021   HDL 54.80 08/18/2021   LDLDIRECT 121.0 08/18/2021   LDLCALC 115 (H) 08/18/2021   ALT 13 11/11/2021   AST 21 11/11/2021   NA 134 (L) 11/11/2021   K 3.2 (L) 11/11/2021   CL 96 (L) 11/11/2021   CREATININE 0.78 11/11/2021   BUN 9 11/11/2021   CO2 28 11/11/2021   TSH 1.33 08/18/2021   HGBA1C 6.1 08/18/2021   MICROALBUR <0.7 08/18/2021    CT ABDOMEN PELVIS W CONTRAST  Result Date: 11/11/2021 CLINICAL DATA:  Right lower quadrant abdominal pain. Nausea, vomiting, diarrhea and abdominal cramping for 1 week. EXAM: CT ABDOMEN AND PELVIS WITH CONTRAST TECHNIQUE: Multidetector CT imaging of the abdomen and pelvis was performed using the standard protocol following bolus administration of intravenous contrast. RADIATION DOSE REDUCTION: This exam was performed according to the departmental dose-optimization program which includes automated exposure control, adjustment of the mA and/or kV according to patient size and/or use of iterative reconstruction technique. CONTRAST:  135m OMNIPAQUE IOHEXOL 300 MG/ML  SOLN COMPARISON:  CT abdomen pelvis 10/27/2021 FINDINGS: Lower chest: No acute abnormality. Hepatobiliary: No focal liver abnormality is seen. No gallbladder  wall thickening, pericholecystic fat stranding, or biliary dilatation. A 0.8 cm hyperdensity is noted at the gallbladder neck (series 2, image 21; series 7, image 6). Pancreas: Diffuse mild parenchymal volume loss and fatty infiltration. No pancreatic ductal dilatation or surrounding inflammatory changes. Spleen: Normal in size without focal abnormality. Adrenals/Urinary Tract: Adrenal glands are unremarkable. A 0.6 cm exophytic cortical lesion at the posterior lower third of the right kidney demonstrates attenuation of 59 Hounsfield units (series 2, image 30). A 1.3 cm partially exophytic cortical lesion in the middle third of the left kidney demonstrates attenuation of 59 Hounsfield units (series 2, image 29). Several other hypodense cortical lesions demonstrate attenuation less than 20 Hounsfield units, consistent with benign cysts. No hydronephrosis or perinephric fat stranding. Stomach/Bowel: Stomach is within normal limits. Appendix appears normal. Diverticulosis of the sigmoid colon. No evidence of bowel wall thickening, distention, or inflammatory changes. Vascular/Lymphatic: Minimal aortic atherosclerosis. No enlarged abdominal or pelvic lymph nodes. Reproductive: A few uterine leiomyomas are noted, measuring up to 1.6 cm (  series 6, image 62). The adnexa are unremarkable. Other: Small fat containing umbilical hernia. No abdominopelvic ascites. Musculoskeletal: No acute or significant osseous findings. IMPRESSION: 1. No acute abnormality in the abdomen or pelvis. Specifically, no findings of appendicitis or bowel obstruction. 2. A 0.8 cm hyperdensity at the gallbladder neck could represent a gallstone. Of note, there are no CT findings to suggest acute cholecystitis. Recommend further evaluation with ultrasound of the right upper abdomen. 3. Two indeterminate bilateral renal lesions. When the patient is clinically stable and able to follow directions and hold their breath (preferably as an outpatient),  further evaluation with dedicated abdominal MRI should be considered. Electronically Signed   By: Ileana Roup M.D.   On: 11/11/2021 18:28    Assessment & Plan:   Problem List Items Addressed This Visit     Bilateral renal masses    Incidental finding on August 30 CT of abdomen.  Reviewed with patient.  Will need MRI at a later date once GI issues have been clarified       Colicky RUQ abdominal pain    With a 9 lb weight loss since June due to altered appetite and post prandial pain  RUQ [place] ordered given the 0.8 cm mass near the GB neck.  Continue mylanta and prevacid since her symptoms have improved on this regeim       Other Visit Diagnoses     Postprandial RUQ pain    -  Primary   Relevant Orders   US Abdomen Limited RUQ (LIVER/GB)   C-reactive protein   Hypokalemia       Relevant Orders   Basic metabolic panel   Magnesium       I spent a total of 30  minutes with this patient in a face to face visit on the date of this encounter reviewing the last office visit with me ,  her most recent ER visit , patient's diet and eating habits,  most recent imaging study ,   and post visit ordering of testing and therapeutics.    Follow-up: Return in about 4 weeks (around 12/16/2021).   Crecencio Mc, MD

## 2021-11-22 ENCOUNTER — Other Ambulatory Visit: Payer: Self-pay | Admitting: Internal Medicine

## 2021-11-22 DIAGNOSIS — I10 Essential (primary) hypertension: Secondary | ICD-10-CM

## 2021-11-22 MED ORDER — AMLODIPINE BESYLATE 5 MG PO TABS: 5.0000 mg | ORAL_TABLET | Freq: Every day | ORAL | 1 refills | Status: DC

## 2021-11-22 NOTE — Assessment & Plan Note (Signed)
Stopping hctz due to persistent hypokalemia.  Amlodipine 5 mg sent as alternative

## 2021-11-30 ENCOUNTER — Ambulatory Visit
Admission: RE | Admit: 2021-11-30 | Discharge: 2021-11-30 | Disposition: A | Discharge status: Home / Self Care | Payer: Medicare PPO | Source: Ambulatory Visit | Attending: Internal Medicine | Admitting: Internal Medicine

## 2021-11-30 DIAGNOSIS — R1011 Right upper quadrant pain: Secondary | ICD-10-CM | POA: Insufficient documentation

## 2021-11-30 DIAGNOSIS — K760 Fatty (change of) liver, not elsewhere classified: Secondary | ICD-10-CM | POA: Diagnosis not present

## 2021-12-02 ENCOUNTER — Telehealth: Payer: Self-pay | Admitting: Internal Medicine

## 2021-12-02 ENCOUNTER — Other Ambulatory Visit: Payer: Self-pay | Admitting: Internal Medicine

## 2021-12-02 DIAGNOSIS — R1011 Right upper quadrant pain: Secondary | ICD-10-CM

## 2021-12-02 NOTE — Telephone Encounter (Signed)
Pt called wanting to know her ultrasound results because she is still not feeling well

## 2021-12-02 NOTE — Telephone Encounter (Signed)
See result note message 

## 2021-12-09 ENCOUNTER — Encounter
Admission: RE | Admit: 2021-12-09 | Discharge: 2021-12-09 | Disposition: A | Discharge status: Home / Self Care | Payer: Medicare PPO | Source: Ambulatory Visit | Attending: Internal Medicine | Admitting: Internal Medicine

## 2021-12-09 DIAGNOSIS — R1011 Right upper quadrant pain: Secondary | ICD-10-CM | POA: Diagnosis not present

## 2021-12-09 MED ORDER — TECHNETIUM TC 99M TETROFOSMIN IV KIT
4.9800 | PACK | Freq: Once | INTRAVENOUS | Status: AC | PRN
  Administered 2021-12-09: 4.98 via INTRAVENOUS

## 2021-12-11 ENCOUNTER — Other Ambulatory Visit: Payer: Medicare PPO

## 2021-12-11 ENCOUNTER — Other Ambulatory Visit: Payer: Self-pay | Admitting: Internal Medicine

## 2021-12-11 DIAGNOSIS — K811 Chronic cholecystitis: Secondary | ICD-10-CM

## 2021-12-22 DIAGNOSIS — R634 Abnormal weight loss: Secondary | ICD-10-CM | POA: Diagnosis not present

## 2021-12-22 DIAGNOSIS — R1084 Generalized abdominal pain: Secondary | ICD-10-CM | POA: Diagnosis not present

## 2021-12-23 ENCOUNTER — Ambulatory Visit: Payer: Medicare PPO | Admitting: Internal Medicine

## 2021-12-24 ENCOUNTER — Other Ambulatory Visit: Payer: Self-pay | Admitting: General Surgery

## 2021-12-24 NOTE — Progress Notes (Signed)
Progress Notes - documented in this encounter Sharon Webster, Sharon Boot, MD - 12/22/2021 9:15 AM EDT Formatting of this note is different from the original. Subjective:   Patient ID: Sharon Webster is a 76 y.o. female.  HPI  The following portions of the patient's history were reviewed and updated as appropriate.  This an established patient is here today for: office visit. Patient has been referred by Dr. Derrel Nip for evaluation of chronic cholecystitis. The patient has had CT scans, abdominal ultrasound, and a HIDA scan. Patient reports she has been constipated for the last week. She has tried enemas x 3, Miralax, and Mylanta. Patient reports she is unable to hold the enemas in for any length of time. She also reports only taking one does of Miralax at night. Patient has had weight loss from last year from 132 pounds to 118 pounds today.   Chief Complaint  Patient presents with  Cholecystitis    BP 132/60  Pulse 96  Temp 37 C (98.6 F)  Ht 162.6 cm ('5\' 4"'$ )  Wt 53.5 kg (118 lb)  SpO2 97%  BMI 20.25 kg/m   Past Medical History:  Diagnosis Date  Colon polyp 2011  COPD (chronic obstructive pulmonary disease) (CMS-HCC)  Diverticulosis  Duodenitis  GERD (gastroesophageal reflux disease)  Glaucoma  Personal history of tobacco use    Past Surgical History:  Procedure Laterality Date  COLONOSCOPY 08/20/2009  BREAST EXCISIONAL BIOPSY Right 12/14/2011  COLONOSCOPY 04/02/2015  5 mm tubular adenoma of the cecum.  COLONOSCOPY 05/07/2020  Sessile serrated polyp/PHx CP/Repeat 80yr/JWB    OB History   Gravida  0  Para  0  Term  0  Preterm  0  AB  0  Living  0    SAB  0  IAB  0  Ectopic  0  Molar  0  Multiple  0  Live Births  0    Obstetric Comments  Age at first period 89      Social History   Socioeconomic History  Marital status: Single  Tobacco Use  Smoking status: Former  Packs/day: 1.00  Years: 20.00  Additional pack years:  0.00  Total pack years: 20.00  Types: Cigarettes  Quit date: 05/15/2001  Years since quitting: 20.6  Smokeless tobacco: Never  Substance and Sexual Activity  Alcohol use: Not Currently  Drug use: Never    No Known Allergies  Current Outpatient Medications  Medication Sig Dispense Refill  amLODIPine (NORVASC) 5 MG tablet Take 5 mg by mouth once daily  atorvastatin (LIPITOR) 20 MG tablet Take 20 mg by mouth once daily  calcium carb/magnesium hydrox (MYLANTA ORAL) Take by mouth once daily as needed  cholecalciferol (VITAMIN D3) 1000 unit tablet Take by mouth once daily  cyanocobalamin (VITAMIN B12) 1000 MCG tablet Take 1,000 mcg by mouth once daily  dicyclomine (BENTYL) 20 mg tablet as directed  lansoprazole (PREVACID) 30 MG DR capsule Take 30 mg by mouth once daily  ondansetron (ZOFRAN-ODT) 4 MG disintegrating tablet Take 4 mg by mouth every 8 (eight) hours as needed for Vomiting or Nausea  pantoprazole (PROTONIX) 40 MG DR tablet Take 40 mg by mouth once daily  bisacodyL (DULCOLAX) 5 mg EC tablet Take two tablets morning and two tablets afternoon day prior to Miralax prep. (Patient not taking: Reported on 12/22/2021) 4 tablet 0  hydroCHLOROthiazide (HYDRODIURIL) 12.5 MG tablet once daily (Patient not taking: Reported on 12/22/2021)  polyethylene glycol (MIRALAX) powder One bottle for colonoscopy prep. Use as directed. (Patient  not taking: Reported on 12/22/2021) 255 g 0  pravastatin (PRAVACHOL) 20 MG tablet Take 20 mg by mouth once daily (Patient not taking: Reported on 12/22/2021)   No current facility-administered medications for this visit.   Family History  Problem Relation Age of Onset  High blood pressure (Hypertension) Mother  Heart disease Mother  COPD Mother  Prostate cancer Father  Stroke Sister  Heart disease Sister  Aneurysm Brother   Labs and Radiology:   CT of the abdomen and pelvis November 11, 2021:  IMPRESSION: 1. No acute abnormality in the abdomen or pelvis.  Specifically, no findings of appendicitis or bowel obstruction. 2. A 0.8 cm hyperdensity at the gallbladder neck could represent a gallstone. Of note, there are no CT findings to suggest acute cholecystitis. Recommend further evaluation with ultrasound of the right upper abdomen. 3. Two indeterminate bilateral renal lesions. When the patient is clinically stable and able to follow directions and hold their breath (preferably as an outpatient), further evaluation with dedicated abdominal MRI should be considered.  Independent review of the study shows a markedly thickened gastric wall up to 2.4 cm, contracted. This could be purely based on distention. Normal intra-abdominal vasculature. All mesenteric vessels patent. No pericholecystic fluid, gallbladder overdistention or inflammatory processes in the right retroperitoneum.   CT of the abdomen October 27, 2021:  IMPRESSION: Diverticulosis without diverticulitis.  Normal-appearing appendix.  No other focal abnormality is noted. Abdominal ultrasound November 30, 2021:  IMPRESSION: 1. Mild hepatic steatosis. 2. The gallbladder and common bile duct are normal in appearance.  HIDA scan with ejection fraction December 09, 2021:  FINDINGS: Prompt uptake and biliary excretion of activity by the liver is seen. Gallbladder activity is visualized, consistent with patency of cystic duct. Biliary activity passes into small bowel, consistent with patent common bile duct.  Calculated gallbladder ejection fraction is 32%. (Normal gallbladder ejection fraction with Ensure is greater than 33%.)  IMPRESSION: Reduced gallbladder ejection fraction as can be seen with chronic cholecystitis/biliary dyskinesia.   The patient reports diarrhea related to the Ensure administered for gallbladder stimulation. No reproduction of her abdominal bloating, right or left-sided abdominal pain or particular right upper quadrant discomfort.  May 07, 2020  colonoscopy pathology:  DIAGNOSIS:  A. COLON POLYP, ASCENDING; HOT SNARE:  - SESSILE SERRATED POLYP.  - NEGATIVE FOR DYSPLASIA AND MALIGNANCY.   November 11, 2021 laboratory:  WBC 4.0 - 10.5 K/uL 5.8  RBC 3.87 - 5.11 MIL/uL 4.33  Hemoglobin 12.0 - 15.0 g/dL 12.2 (stable dating back to 2016) HCT 36.0 - 46.0 % 36.5  MCV 80.0 - 100.0 fL 84.3  MCH 26.0 - 34.0 pg 28.2  MCHC 30.0 - 36.0 g/dL 33.4  RDW 11.5 - 15.5 % 12.2  Platelets 150 - 400 K/uL 259  nRBC 0.0 - 0.2 % 0.0  Neutrophils Relative % % 70  Neutro Abs 1.7 - 7.7 K/uL 4.1  Lymphocytes Relative % 21  Lymphs Abs 0.7 - 4.0 K/uL 1.2  Monocytes Relative % 8  Monocytes Absolute 0.1 - 1.0 K/uL 0.5  Eosinophils Relative % 0  Eosinophils Absolute 0.0 - 0.5 K/uL 0.0  Basophils Relative % 1  Basophils Absolute 0.0 - 0.1 K/uL 0.0  Immature Granulocytes % 0  Abs Immature Granulocytes 0.00 - 0.07 K/uL 0.01   Sodium 135 - 145 mmol/L 134 Low  Potassium 3.5 - 5.1 mmol/L 3.2 Low  Chloride 98 - 111 mmol/L 96 Low  CO2 22 - 32 mmol/L 28  Glucose, Bld 70 - 99 mg/dL  137 High  Comment: Glucose reference range applies only to samples taken after fasting for at least 8 hours. BUN 8 - 23 mg/dL 9  Creatinine, Ser 0.44 - 1.00 mg/dL 0.78  Calcium 8.9 - 10.3 mg/dL 9.8  Total Protein 6.5 - 8.1 g/dL 7.6  Albumin 3.5 - 5.0 g/dL 4.6  AST 15 - 41 U/L 21  ALT 0 - 44 U/L 13  Alkaline Phosphatase 38 - 126 U/L 65  Total Bilirubin 0.3 - 1.2 mg/dL 0.9  GFR, Estimated >60 mL/min >60  Comment: (NOTE)  Calculated using the CKD-EPI Creatinine Equation (2021)   ED records relating to her October 27, 2021 and November 11, 2021 evaluations for lower abdominal pain reviewed.  Review of Systems  Constitutional: Negative for chills and fever.  Respiratory: Negative for cough.    Objective:  Physical Exam Exam conducted with a chaperone present.  Constitutional:  Appearance: Normal appearance.  Cardiovascular:  Rate and Rhythm: Normal rate and regular  rhythm.  Pulses: Normal pulses.  Heart sounds: Normal heart sounds.  Pulmonary:  Effort: Pulmonary effort is normal.  Breath sounds: Normal breath sounds.  Abdominal:  General: Abdomen is flat. Bowel sounds are normal.  Palpations: Abdomen is soft.  Tenderness: There is no abdominal tenderness.  Hernia: A hernia is present. Hernia is present in the umbilical area (small tab of fat in the umbilical defect, non-tender).  Musculoskeletal:  Cervical back: Neck supple.  Skin: General: Skin is warm and dry.  Neurological:  Mental Status: She is alert and oriented to person, place, and time.  Psychiatric:  Mood and Affect: Mood normal.  Behavior: Behavior normal.    Assessment:   Documented 12 pound weight loss since February 2022.  Atypical abdominal pain.  Longstanding constipation.  Normal colonoscopy 2022 with the exception of a single polyp.  1% below normal gallbladder ejection fraction with no reproduction of abdominal symptoms with Ensure administration for gallbladder stimulation.  Plan:   The patient's reports of early satiety, loss of appetite and minimal dietary intolerance does not present a indication for cholecystectomy based on imaging or history. Her first report when asked about the reason for her visit was that she is weak and shaky. She reported a profound bout of constipation and July requiring the use of enemas. Moderate recurrence lately. Incomplete resolved with enemas or single Dulcolax administration.  It is hard to pin her loss of appetite on the scant biliary tract findings. Her weight loss is of note, and while the CT is officially read as "normal" regarding the stomach the gastric wall thickening is impressive. She does not relate vomiting although she has of late had some nausea relieved with Zofran.  I think a starting point is to complete an upper endoscopy. If this is normal we may be faced with the dilemma of considering cholecystectomy with the  hope that will relieve her symptoms.  There are no neurologic changes evident during her lung exam today to suggest an intracranial process for her loss of appetite.  We will plan to proceed with upper endoscopy next week to allow family to come in from [place] to be her driver.  Over 60 minutes was spent in record review, physician consultation and office visit.   This note is partially prepared by Ledell Noss, CMA acting as a scribe in the presence of Dr. Hervey Ard, MD.   The documentation recorded by the scribe accurately reflects the service I personally performed and the decisions made by me.   Sharon Webster  Sharon Kinsman, MD FACS  Electronically signed by Sharon Masker, MD at 12/24/2021 7:01 AM EDT

## 2021-12-30 ENCOUNTER — Encounter
Admission: RE | Disposition: A | Discharge status: Home / Self Care | Payer: Self-pay | Source: Home / Self Care | Attending: General Surgery

## 2021-12-30 ENCOUNTER — Ambulatory Visit: Payer: Medicare PPO | Admitting: Anesthesiology

## 2021-12-30 ENCOUNTER — Ambulatory Visit
Admission: RE | Admit: 2021-12-30 | Discharge: 2021-12-30 | Disposition: A | Discharge status: Home / Self Care | Payer: Medicare PPO | Attending: General Surgery | Admitting: General Surgery

## 2021-12-30 DIAGNOSIS — K2970 Gastritis, unspecified, without bleeding: Secondary | ICD-10-CM | POA: Diagnosis not present

## 2021-12-30 DIAGNOSIS — Z79899 Other long term (current) drug therapy: Secondary | ICD-10-CM | POA: Diagnosis not present

## 2021-12-30 DIAGNOSIS — I10 Essential (primary) hypertension: Secondary | ICD-10-CM | POA: Diagnosis not present

## 2021-12-30 DIAGNOSIS — R634 Abnormal weight loss: Secondary | ICD-10-CM | POA: Insufficient documentation

## 2021-12-30 DIAGNOSIS — R933 Abnormal findings on diagnostic imaging of other parts of digestive tract: Secondary | ICD-10-CM | POA: Insufficient documentation

## 2021-12-30 DIAGNOSIS — K219 Gastro-esophageal reflux disease without esophagitis: Secondary | ICD-10-CM | POA: Diagnosis not present

## 2021-12-30 DIAGNOSIS — Z87891 Personal history of nicotine dependence: Secondary | ICD-10-CM | POA: Diagnosis not present

## 2021-12-30 DIAGNOSIS — K449 Diaphragmatic hernia without obstruction or gangrene: Secondary | ICD-10-CM | POA: Diagnosis not present

## 2021-12-30 DIAGNOSIS — Z6823 Body mass index (BMI) 23.0-23.9, adult: Secondary | ICD-10-CM | POA: Diagnosis not present

## 2021-12-30 DIAGNOSIS — K222 Esophageal obstruction: Secondary | ICD-10-CM | POA: Insufficient documentation

## 2021-12-30 DIAGNOSIS — K2950 Unspecified chronic gastritis without bleeding: Secondary | ICD-10-CM | POA: Insufficient documentation

## 2021-12-30 DIAGNOSIS — K317 Polyp of stomach and duodenum: Secondary | ICD-10-CM | POA: Diagnosis not present

## 2021-12-30 DIAGNOSIS — K3189 Other diseases of stomach and duodenum: Secondary | ICD-10-CM | POA: Diagnosis not present

## 2021-12-30 HISTORY — PX: ESOPHAGOGASTRODUODENOSCOPY (EGD) WITH PROPOFOL: SHX5813

## 2021-12-30 SURGERY — ESOPHAGOGASTRODUODENOSCOPY (EGD) WITH PROPOFOL
Anesthesia: General

## 2021-12-30 MED ORDER — PROPOFOL 500 MG/50ML IV EMUL
INTRAVENOUS | Status: DC | PRN
  Administered 2021-12-30: 75 ug/kg/min via INTRAVENOUS

## 2021-12-30 MED ORDER — PROPOFOL 1000 MG/100ML IV EMUL
INTRAVENOUS | Status: AC
  Filled 2021-12-30: qty 100

## 2021-12-30 MED ORDER — LIDOCAINE HCL (CARDIAC) PF 100 MG/5ML IV SOSY
PREFILLED_SYRINGE | INTRAVENOUS | Status: DC | PRN
  Administered 2021-12-30: 50 mg via INTRAVENOUS

## 2021-12-30 MED ORDER — METOCLOPRAMIDE HCL 5 MG PO TABS: 5.0000 mg | ORAL_TABLET | Freq: Three times a day (TID) | ORAL | 0 refills | Status: DC

## 2021-12-30 MED ORDER — PROPOFOL 10 MG/ML IV BOLUS
INTRAVENOUS | Status: DC | PRN
  Administered 2021-12-30: 50 mg via INTRAVENOUS

## 2021-12-30 MED ORDER — SODIUM CHLORIDE 0.9 % IV SOLN: INTRAVENOUS | Status: DC

## 2021-12-30 NOTE — H&P (Signed)
Sharon Webster [id number] [birth year]     HPI: Weight loss, abnormal imaging of the gastric wall. For EGD.   Medications Prior to Admission  Medication Sig Dispense Refill Last Dose   amLODipine (NORVASC) 5 MG tablet Take 1 tablet (5 mg total) by mouth daily. 90 tablet 1 12/29/2021   Calcium & Magnesium Carbonates (MYLANTA PO) Take by mouth.   12/29/2021   cholecalciferol (VITAMIN D3) 25 MCG (1000 UT) tablet Take 1,000 Units by mouth daily.   12/29/2021   dicyclomine (BENTYL) 20 MG tablet TAKE 1 TABLET(20 MG) BY MOUTH THREE TIMES DAILY BEFORE MEALS 270 tablet 1 12/29/2021   lansoprazole (PREVACID) 30 MG capsule Take 30 mg by mouth daily at 12 noon.   12/29/2021   ondansetron (ZOFRAN-ODT) 4 MG disintegrating tablet Take 1 tablet (4 mg total) by mouth every 8 (eight) hours as needed for nausea or vomiting. 20 tablet 0 12/29/2021   pantoprazole (PROTONIX) 40 MG tablet TAKE 1 TABLET(40 MG) BY MOUTH DAILY 90 tablet 1 12/29/2021   vitamin B-12 (CYANOCOBALAMIN) 1000 MCG tablet Take 1,000 mcg by mouth daily.   12/29/2021   vitamin E 1000 UNIT capsule Take 1,000 Units by mouth daily.   12/29/2021   No Known Allergies Past Medical History:  Diagnosis Date   Bowel trouble    Breast screening, unspecified    atypical ductal hyperplasia, declined excision.   Colon polyp 2011   COPD (chronic obstructive pulmonary disease) (HCC)    Diverticulosis    Duodenitis    GERD (gastroesophageal reflux disease)    Glaucoma    Lump or mass in breast    Personal history of tobacco use, presenting hazards to health    Special screening for malignant neoplasms, colon    Past Surgical History:  Procedure Laterality Date   BREAST BIOPSY Right 12/14/2011   3:00, Finesse biopsy,December 14, 2011. This was completed as her FNA showed atypia, papilloma, "features" of  ADH  identified. Declined formal excision. I elected not to offer chemoprevention without formal excision. .(January 13, 2012.)    COLONOSCOPY   08/20/2009   Tubular adenoma x2. Transverse and descending colon Dr. Dionne Milo   COLONOSCOPY WITH PROPOFOL N/A 04/02/2015   Tubular adenoma. F/U 2022.  Surgeon: Robert Bellow, MD;  Location: [facility] ENDOSCOPY;  Service: Endoscopy;  Laterality: N/A;   COLONOSCOPY WITH PROPOFOL N/A 05/07/2020   Procedure: COLONOSCOPY WITH PROPOFOL;  Surgeon: Robert Bellow, MD;  Location: [facility] ENDOSCOPY;  Service: Endoscopy;  Laterality: N/A;   Social History   Socioeconomic History   Marital status: Widowed    Spouse name: Not on file   Number of children: Not on file   Years of education: Not on file   Highest education level: Not on file  Occupational History   Not on file  Tobacco Use   Smoking status: Former    Packs/day: 1.00    Years: 20.00    Total pack years: 20.00    Types: Cigarettes    Quit date: 05/15/2001    Years since quitting: 20.6   Smokeless tobacco: Never  Vaping Use   Vaping Use: Never used  Substance and Sexual Activity   Alcohol use: Not Currently    Alcohol/week: 3.0 standard drinks of alcohol    Types: 3 Cans of beer per week   Drug use: No   Sexual activity: Not Currently  Other Topics Concern   Not on file  Social History Narrative   Not on file   Social Determinants of  Health   Financial Resource Strain: Low Risk  (03/04/2021)   Overall Financial Resource Strain (CARDIA)    Difficulty of Paying Living Expenses: Not hard at all  Food Insecurity: No Food Insecurity (03/04/2021)   Hunger Vital Sign    Worried About Running Out of Food in the Last Year: Never true    Ran Out of Food in the Last Year: Never true  Transportation Needs: No Transportation Needs (03/04/2021)   PRAPARE - Hydrologist (Medical): No    Lack of Transportation (Non-Medical): No  Physical Activity: Unknown (02/21/2018)   Exercise Vital Sign    Days of Exercise per Week: 0 days    Minutes of Exercise per Session: Not on file  Stress: No Stress Concern  Present (03/04/2021)   S[place]    Feeling of Stress : Not at all  Social Connections: Unknown (03/04/2021)   Social Connection and Isolation Panel [NHANES]    Frequency of Communication with Friends and Family: More than three times a week    Frequency of Social Gatherings with Friends and Family: More than three times a week    Attends Religious Services: Not on file    Active Member of Clubs or Organizations: Not on file    Attends Archivist Meetings: Not on file    Marital Status: Not on file  Intimate Partner Violence: Not At Risk (03/04/2021)   Humiliation, Afraid, Rape, and Kick questionnaire    Fear of Current or Ex-Partner: No    Emotionally Abused: No    Physically Abused: No    Sexually Abused: No   Social History   Social History Narrative   Not on file     ROS: Negative.     PE: HEENT: Negative. Lungs: Clear. Cardio: RR.     Assessment/Plan:  Proceed with planned endoscopy.  Forest Gleason Starlyn Droge 12/30/2021

## 2021-12-30 NOTE — Op Note (Signed)
[facility] Gastroenterology Patient Name: Sharon Webster Procedure Date: 12/30/2021 7:08 AM MRN: [id number] Account #: [id number] Date of Birth: 05-05-1945 Admit Type: Outpatient Age: 76 Room: [facility] ENDO ROOM 2 Gender: Female Note Status: Finalized Instrument Name: Upper Endoscope [id number] Procedure:             Upper GI endoscopy Indications:           Abnormal CT of the GI tract, Weight loss Providers:             Robert Bellow, MD Referring MD:          Deborra Medina, MD (Referring MD) Medicines:             Propofol per Anesthesia Complications:         No immediate complications. Procedure:             Pre-Anesthesia Assessment:                        - Prior to the procedure, a History and Physical was                         performed, and patient medications, allergies and                         sensitivities were reviewed. The patient's tolerance                         of previous anesthesia was reviewed.                        - The risks and benefits of the procedure and the                         sedation options and risks were discussed with the                         patient. All questions were answered and informed                         consent was obtained.                        After obtaining informed consent, the endoscope was                         passed under direct vision. Throughout the procedure,                         the patient's blood pressure, pulse, and oxygen                         saturations were monitored continuously. The Endoscope                         was introduced through the mouth, and advanced to the                         second part of duodenum. The upper GI endoscopy was  accomplished without difficulty. The patient tolerated                         the procedure well. Findings:      A widely patent Schatzki ring was found at the gastroesophageal junction.      A few 5 mm  sessile polyps with no bleeding and no stigmata of recent       bleeding were found in the gastric body. Biopsies were taken with a cold       forceps for histology.      Diffuse mild inflammation characterized by erythema was found in the       gastric antrum. Biopsies were taken with a cold forceps for histology.      The examined duodenum was normal. Impression:            - Widely patent Schatzki ring.                        - A few gastric polyps. Biopsied.                        - Chronic gastritis. Biopsied.                        - Normal examined duodenum. Recommendation:        - Telephone endoscopist for pathology results in 1                         week. Procedure Code(s):     --- Professional ---                        [phone], Esophagogastroduodenoscopy, flexible,                         transoral; with biopsy, single or multiple Diagnosis Code(s):     --- Professional ---                        K22.2, Esophageal obstruction                        K31.7, Polyp of stomach and duodenum                        K29.50, Unspecified chronic gastritis without bleeding                        R63.4, Abnormal weight loss                        R93.3, Abnormal findings on diagnostic imaging of                         other parts of digestive tract CPT copyright 2019 American Medical Association. All rights reserved. The codes documented in this report are preliminary and upon coder review may  be revised to meet current compliance requirements. Robert Bellow, MD 12/30/2021 7:57:35 AM This report has been signed electronically. Number of Addenda: 0 Note Initiated On: 12/30/2021 7:08 AM Estimated Blood Loss:  Estimated blood loss was minimal.      [facility]

## 2021-12-30 NOTE — Transfer of Care (Signed)
Immediate Anesthesia Transfer of Care Note  Patient: [name]  Procedure(s) Performed: ESOPHAGOGASTRODUODENOSCOPY (EGD) WITH PROPOFOL  Patient Location: PACU  Anesthesia Type:General  Level of Consciousness: sedated  Airway & Oxygen Therapy: Patient Spontanous Breathing and Patient connected to nasal cannula oxygen  Post-op Assessment: Report given to RN and Post -op Vital signs reviewed and stable  Post vital signs: Reviewed and stable  Last Vitals:  Vitals Value Taken Time  BP 96/45 12/30/21 0759  Temp    Pulse 87 12/30/21 0800  Resp 24 12/30/21 0800  SpO2 95 % 12/30/21 0800  Vitals shown include unvalidated device data.  Last Pain:  Vitals:   12/30/21 0645  TempSrc: Temporal  PainSc: 6          Complications: No notable events documented.

## 2021-12-30 NOTE — Anesthesia Preprocedure Evaluation (Signed)
Anesthesia Evaluation  Patient identified by MRN, date of birth, ID band Patient awake    Reviewed: Allergy & Precautions, H&P , NPO status , Patient's Chart, lab work & pertinent test results, reviewed documented beta blocker date and time   Airway Mallampati: II   Neck ROM: full    Dental  (+) Poor Dentition   Pulmonary neg pulmonary ROS, neg shortness of breath, COPD, former smoker,    Pulmonary exam normal        Cardiovascular Exercise Tolerance: Poor hypertension, On Medications negative cardio ROS Normal cardiovascular exam Rhythm:regular Rate:Normal     Neuro/Psych negative neurological ROS  negative psych ROS   GI/Hepatic Neg liver ROS, GERD  Medicated,  Endo/Other  negative endocrine ROS  Renal/GU negative Renal ROS  negative genitourinary   Musculoskeletal   Abdominal   Peds  Hematology  (+) Blood dyscrasia, anemia ,   Anesthesia Other Findings Past Medical History: No date: Bowel trouble No date: Breast screening, unspecified     Comment:  atypical ductal hyperplasia, declined excision. 2011: Colon polyp No date: COPD (chronic obstructive pulmonary disease) (HCC) No date: Diverticulosis No date: Duodenitis No date: GERD (gastroesophageal reflux disease) No date: Glaucoma No date: Lump or mass in breast No date: Personal history of tobacco use, presenting hazards to health No date: Special screening for malignant neoplasms, colon Past Surgical History: 12/14/2011: BREAST BIOPSY; Right     Comment:  3:00, Finesse biopsy,December 14, 2011. This was completed              as her FNA showed atypia, papilloma, "features" of  ADH                identified. Declined formal excision. I elected not to               offer chemoprevention without formal excision. .(January 13, 2012.)  08/20/2009: COLONOSCOPY     Comment:  Tubular adenoma x2. Transverse and descending colon Dr.                Dionne Milo 04/02/2015: COLONOSCOPY WITH PROPOFOL; N/A     Comment:  Tubular adenoma. F/U 2022.  Surgeon: Robert Bellow,               MD;  Location: [facility] ENDOSCOPY;  Service: Endoscopy;                Laterality: N/A; 05/07/2020: COLONOSCOPY WITH PROPOFOL; N/A     Comment:  Procedure: COLONOSCOPY WITH PROPOFOL;  Surgeon: Robert Bellow, MD;  Location: [facility] ENDOSCOPY;  Service:               Endoscopy;  Laterality: N/A; BMI    Body Mass Index: 23.05 kg/m     Reproductive/Obstetrics negative OB ROS                             Anesthesia Physical Anesthesia Plan  ASA: 3  Anesthesia Plan: General   Post-op Pain Management:    Induction:   PONV Risk Score and Plan:   Airway Management Planned:   Additional Equipment:   Intra-op Plan:   Post-operative Plan:   Informed Consent: I have reviewed the patients History and Physical, chart, labs and discussed the procedure including the risks, benefits and alternatives for the  proposed anesthesia with the patient or authorized representative who has indicated his/her understanding and acceptance.     Dental Advisory Given  Plan Discussed with: CRNA  Anesthesia Plan Comments:         Anesthesia Quick Evaluation

## 2021-12-30 NOTE — Anesthesia Postprocedure Evaluation (Signed)
Anesthesia Post Note  Patient: Sharon Webster  Procedure(s) Performed: ESOPHAGOGASTRODUODENOSCOPY (EGD) WITH PROPOFOL  Patient location during evaluation: PACU Anesthesia Type: General Level of consciousness: awake and alert Pain management: pain level controlled Vital Signs Assessment: post-procedure vital signs reviewed and stable Respiratory status: spontaneous breathing, nonlabored ventilation, respiratory function stable and patient connected to nasal cannula oxygen Cardiovascular status: blood pressure returned to baseline and stable Postop Assessment: no apparent nausea or vomiting Anesthetic complications: no   No notable events documented.   Last Vitals:  Vitals:   12/30/21 0808 12/30/21 0818  BP: 126/75 134/76  Pulse: 79 76  Resp: 14 (!) 28  Temp:    SpO2: 98% 100%    Last Pain:  Vitals:   12/30/21 0818  TempSrc:   PainSc: 0-No pain                 Molli Barrows

## 2021-12-31 ENCOUNTER — Encounter: Payer: Self-pay | Admitting: General Surgery

## 2021-12-31 LAB — SURGICAL PATHOLOGY

## 2022-01-07 DIAGNOSIS — R634 Abnormal weight loss: Secondary | ICD-10-CM | POA: Diagnosis not present

## 2022-01-15 DIAGNOSIS — D225 Melanocytic nevi of trunk: Secondary | ICD-10-CM | POA: Diagnosis not present

## 2022-01-15 DIAGNOSIS — L814 Other melanin hyperpigmentation: Secondary | ICD-10-CM | POA: Diagnosis not present

## 2022-01-15 DIAGNOSIS — X32XXXA Exposure to sunlight, initial encounter: Secondary | ICD-10-CM | POA: Diagnosis not present

## 2022-01-15 DIAGNOSIS — L821 Other seborrheic keratosis: Secondary | ICD-10-CM | POA: Diagnosis not present

## 2022-01-15 DIAGNOSIS — D2272 Melanocytic nevi of left lower limb, including hip: Secondary | ICD-10-CM | POA: Diagnosis not present

## 2022-01-15 DIAGNOSIS — D485 Neoplasm of uncertain behavior of skin: Secondary | ICD-10-CM | POA: Diagnosis not present

## 2022-01-15 DIAGNOSIS — D2262 Melanocytic nevi of left upper limb, including shoulder: Secondary | ICD-10-CM | POA: Diagnosis not present

## 2022-01-15 DIAGNOSIS — D2271 Melanocytic nevi of right lower limb, including hip: Secondary | ICD-10-CM | POA: Diagnosis not present

## 2022-01-15 DIAGNOSIS — D2261 Melanocytic nevi of right upper limb, including shoulder: Secondary | ICD-10-CM | POA: Diagnosis not present

## 2022-01-20 ENCOUNTER — Ambulatory Visit: Payer: Medicare PPO | Admitting: Internal Medicine

## 2022-01-20 ENCOUNTER — Encounter: Payer: Self-pay | Admitting: Internal Medicine

## 2022-01-20 VITALS — BP 124/72 | HR 77 | Temp 97.7°F | Ht 60.0 in | Wt 118.2 lb

## 2022-01-20 DIAGNOSIS — N281 Cyst of kidney, acquired: Secondary | ICD-10-CM | POA: Insufficient documentation

## 2022-01-20 DIAGNOSIS — N2889 Other specified disorders of kidney and ureter: Secondary | ICD-10-CM | POA: Diagnosis not present

## 2022-01-20 DIAGNOSIS — R1011 Right upper quadrant pain: Secondary | ICD-10-CM

## 2022-01-20 DIAGNOSIS — N289 Disorder of kidney and ureter, unspecified: Secondary | ICD-10-CM | POA: Diagnosis not present

## 2022-01-20 NOTE — Assessment & Plan Note (Signed)
Resolved,  with negative workup by CT scan and EGD.  Continue food diary,  treatment for IBS

## 2022-01-20 NOTE — Assessment & Plan Note (Signed)
MRI abdomen ordered to rule out renal cell CA

## 2022-01-20 NOTE — Progress Notes (Signed)
Subjective:  Patient ID: Sharon Webster, female    DOB: 1946/01/11  Age: 76 y.o. MRN: [id number]  CC: The primary encounter diagnosis was Other specified disorders of kidney and ureter. Diagnoses of Renal lesion and Colicky RUQ abdominal pain were also pertinent to this visit.   HPI Sharon Webster presents for  Chief Complaint  Patient presents with   Follow-up    4 week follow up     Abdominal pain, vomiting, weight loss:  seen in [place] after 2 ER visits for abd pain, vomiting:  sent to [facility] for evaluation with EGD done Oct 18 for thickened stomach wall seen on contrasted abdominal CT.  Biopsies done, no  cancer or cancerous polyps found.    Bentyl stopped and reglan started.  She is no  longer vomiting .  Also had a derm visit no cancer  No longer taking reglan for the past week . Has not had any vomiting IN A WEEK.   Keeping a food log. Post prandial pain occurring only certain foods:   Salad and [place] food cause abdominal pain and gas.  Bowles moving reglualry with daily use of miralax .  Notes that food tastes bland,  tongue feels coated.   RENAL LESIONS:  INCIDENTAL FINDINGS 1.3 CM PARTIA EXOPHYTIC LESION LEFT KIDNEY .   Outpatient Medications Prior to Visit  Medication Sig Dispense Refill   amLODipine (NORVASC) 5 MG tablet Take 1 tablet (5 mg total) by mouth daily. 90 tablet 1   atorvastatin (LIPITOR) 20 MG tablet Take 20 mg by mouth daily.     Calcium & Magnesium Carbonates (MYLANTA PO) Take by mouth.     cholecalciferol (VITAMIN D3) 25 MCG (1000 UT) tablet Take 1,000 Units by mouth daily.     lansoprazole (PREVACID) 30 MG capsule Take 30 mg by mouth daily at 12 noon.     metoCLOPramide (REGLAN) 5 MG tablet Take 1 tablet (5 mg total) by mouth 3 (three) times daily before meals. 90 tablet 0   pantoprazole (PROTONIX) 40 MG tablet TAKE 1 TABLET(40 MG) BY MOUTH DAILY 90 tablet 1   vitamin B-12 (CYANOCOBALAMIN) 1000 MCG tablet Take 1,000 mcg by mouth daily.     vitamin  E 1000 UNIT capsule Take 1,000 Units by mouth daily.     ondansetron (ZOFRAN-ODT) 4 MG disintegrating tablet Take 1 tablet (4 mg total) by mouth every 8 (eight) hours as needed for nausea or vomiting. (Patient not taking: Reported on 01/20/2022) 20 tablet 0   No facility-administered medications prior to visit.    Review of Systems;  Patient denies headache, fevers, malaise, unintentional weight loss, skin rash, eye pain, sinus congestion and sinus pain, sore throat, dysphagia,  hemoptysis , cough, dyspnea, wheezing, chest pain, palpitations, orthopnea, edema, abdominal pain, nausea, melena, diarrhea, constipation, flank pain, dysuria, hematuria, urinary  Frequency, nocturia, numbness, tingling, seizures,  Focal weakness, Loss of consciousness,  Tremor, insomnia, depression, anxiety, and suicidal ideation.      Objective:  BP 124/72 (BP Location: Left Arm, Patient Position: Sitting, Cuff Size: Normal)   Pulse 77   Temp 97.7 F (36.5 C) (Oral)   Ht 5' (1.524 m)   Wt 118 lb 3.2 oz (53.6 kg)   SpO2 99%   BMI 23.08 kg/m   BP Readings from Last 3 Encounters:  01/20/22 124/72  12/30/21 134/76  11/18/21 (!) 148/66    Wt Readings from Last 3 Encounters:  01/20/22 118 lb 3.2 oz (53.6 kg)  12/30/21 118 lb (53.5 kg)  11/18/21 119 lb 9.6 oz (54.3 kg)    General appearance: alert, cooperative and appears stated age Ears: normal TM's and external ear canals both ears Throat: lips, mucosa, and tongue normal; teeth and gums normal Neck: no adenopathy, no carotid bruit, supple, symmetrical, trachea midline and thyroid not enlarged, symmetric, no tenderness/mass/nodules Back: symmetric, no curvature. ROM normal. No CVA tenderness. Lungs: clear to auscultation bilaterally Heart: regular rate and rhythm, S1, S2 normal, no murmur, click, rub or gallop Abdomen: soft, non-tender; bowel sounds normal; no masses,  no organomegaly Pulses: 2+ and symmetric Skin: Skin color, texture, turgor normal. No  rashes or lesions Lymph nodes: Cervical, supraclavicular, and axillary nodes normal. Neuro:  awake and interactive with normal mood and affect. Higher cortical functions are normal. Speech is clear without word-finding difficulty or dysarthria. Extraocular movements are intact. Visual fields of both eyes are grossly intact. Sensation to light touch is grossly intact bilaterally of upper and lower extremities. Motor examination shows 4+/5 symmetric hand grip and upper extremity and 5/5 lower extremity strength. There is no pronation or drift. Gait is non-ataxic   Lab Results  Component Value Date   HGBA1C 6.1 08/18/2021   HGBA1C 6.0 02/28/2019   HGBA1C 5.9 04/17/2018    Lab Results  Component Value Date   CREATININE 0.89 11/18/2021   CREATININE 0.78 11/11/2021   CREATININE 0.81 10/27/2021    Lab Results  Component Value Date   WBC 5.8 11/11/2021   HGB 12.2 11/11/2021   HCT 36.5 11/11/2021   PLT 259 11/11/2021   GLUCOSE 92 11/18/2021   CHOL 200 08/18/2021   TRIG 153.0 (H) 08/18/2021   HDL 54.80 08/18/2021   LDLDIRECT 121.0 08/18/2021   LDLCALC 115 (H) 08/18/2021   ALT 13 11/11/2021   AST 21 11/11/2021   NA 138 11/18/2021   K 3.4 (L) 11/18/2021   CL 99 11/18/2021   CREATININE 0.89 11/18/2021   BUN 11 11/18/2021   CO2 30 11/18/2021   TSH 1.33 08/18/2021   HGBA1C 6.1 08/18/2021   MICROALBUR <0.7 08/18/2021    No results found.  Assessment & Plan:   Problem List Items Addressed This Visit     Renal lesion    MRI abdomen ordered to rule out renal cell CA       Relevant Orders   MR ABDOMEN WWO CONTRAST   Colicky RUQ abdominal pain    Resolved,  with negative workup by CT scan and EGD.  Continue food diary,  treatment for IBS      Other Visit Diagnoses     Other specified disorders of kidney and ureter    -  Primary   Relevant Orders   MR ABDOMEN WWO CONTRAST       I spent a total of 30 minutes with this patient in a face to face visit on the date of this  encounter reviewing the last office visit with me in  September, most recent visit with Dr Bary Castilla,  last EGD ,   patient's diet  recent ER visit including labs and imaging studies ,   and post visit ordering of testing and therapeutics.    Follow-up: Return in about 6 months (around 07/21/2022).   Crecencio Mc, MD

## 2022-01-20 NOTE — Patient Instructions (Addendum)
YOU CAN RESUME TAKING BENTYL 20-30 MINUTES BEFORE EATING   THE MRI WILL BE ORDERED  TO INVESTIGATE THE SPOT ON YOUR LEFT KIDNEY,  IT WILL SCHEDULED AFTER THANKSGIVING   YOUR BLOOD PRESSURE IS UNDER EXCELLENT CONTROL.  CONTINUE AMLODIPINE\

## 2022-01-28 DIAGNOSIS — R1084 Generalized abdominal pain: Secondary | ICD-10-CM | POA: Diagnosis not present

## 2022-02-03 DIAGNOSIS — Z1231 Encounter for screening mammogram for malignant neoplasm of breast: Secondary | ICD-10-CM | POA: Diagnosis not present

## 2022-02-03 LAB — HM MAMMOGRAPHY

## 2022-02-11 ENCOUNTER — Ambulatory Visit
Admission: RE | Admit: 2022-02-11 | Discharge: 2022-02-11 | Disposition: A | Discharge status: Home / Self Care | Payer: Medicare PPO | Source: Ambulatory Visit | Attending: Internal Medicine | Admitting: Internal Medicine

## 2022-02-11 DIAGNOSIS — N2889 Other specified disorders of kidney and ureter: Secondary | ICD-10-CM | POA: Diagnosis not present

## 2022-02-11 DIAGNOSIS — N289 Disorder of kidney and ureter, unspecified: Secondary | ICD-10-CM | POA: Insufficient documentation

## 2022-02-11 MED ORDER — GADOBUTROL 1 MMOL/ML IV SOLN
6.0000 mL | Freq: Once | INTRAVENOUS | Status: AC | PRN
  Administered 2022-02-11: 6 mL via INTRAVENOUS

## 2022-02-24 NOTE — Telephone Encounter (Signed)
Abstraction  

## 2022-02-25 ENCOUNTER — Encounter: Payer: Self-pay | Admitting: Internal Medicine

## 2022-03-04 ENCOUNTER — Ambulatory Visit: Payer: Medicare PPO

## 2022-03-04 ENCOUNTER — Telehealth: Payer: Self-pay

## 2022-03-04 NOTE — Telephone Encounter (Signed)
No answer when called for scheduled AWV. Unable to eave a message. Will try to call again before the end of day. Otherwise, okay to reschedule.

## 2022-03-05 ENCOUNTER — Ambulatory Visit (INDEPENDENT_AMBULATORY_CARE_PROVIDER_SITE_OTHER): Payer: Medicare PPO

## 2022-03-05 ENCOUNTER — Telehealth: Payer: Self-pay

## 2022-03-05 VITALS — Ht 60.0 in | Wt 118.0 lb

## 2022-03-05 DIAGNOSIS — Z0000 Encounter for general adult medical examination without abnormal findings: Secondary | ICD-10-CM | POA: Diagnosis not present

## 2022-03-05 NOTE — Telephone Encounter (Signed)
Opened in error

## 2022-03-05 NOTE — Progress Notes (Signed)
Subjective:   Sharon Webster is a 76 y.o. female who presents for Medicare Annual (Subsequent) preventive examination.  Review of Systems    No ROS.  Medicare Wellness Virtual Visit.  Visual/audio telehealth visit, UTA vital signs.   See social history for additional risk factors.   Cardiac Risk Factors include: advanced age (>72mn, >>33women);hypertension     Objective:    Today's Vitals   03/05/22 1156  Weight: 118 lb (53.5 kg)  Height: 5' (1.524 m)   Body mass index is 23.05 kg/m.     03/05/2022   11:58 AM 12/30/2021    6:42 AM 11/11/2021    3:09 PM 10/27/2021    5:35 PM 03/04/2021   11:26 AM 05/07/2020    9:30 AM 03/03/2020   11:53 AM  Advanced Directives  Does Patient Have a Medical Advance Directive? No No No No No No No  Would patient like information on creating a medical advance directive? No - Patient declined    No - Patient declined No - Patient declined No - Patient declined    Current Medications (verified) Outpatient Encounter Medications as of 03/05/2022  Medication Sig   amLODipine (NORVASC) 5 MG tablet Take 1 tablet (5 mg total) by mouth daily.   atorvastatin (LIPITOR) 20 MG tablet Take 20 mg by mouth daily.   Calcium & Magnesium Carbonates (MYLANTA PO) Take by mouth.   cholecalciferol (VITAMIN D3) 25 MCG (1000 UT) tablet Take 1,000 Units by mouth daily.   lansoprazole (PREVACID) 30 MG capsule Take 30 mg by mouth daily at 12 noon.   metoCLOPramide (REGLAN) 5 MG tablet Take 1 tablet (5 mg total) by mouth 3 (three) times daily before meals.   ondansetron (ZOFRAN-ODT) 4 MG disintegrating tablet Take 1 tablet (4 mg total) by mouth every 8 (eight) hours as needed for nausea or vomiting. (Patient not taking: Reported on 01/20/2022)   pantoprazole (PROTONIX) 40 MG tablet TAKE 1 TABLET(40 MG) BY MOUTH DAILY   vitamin B-12 (CYANOCOBALAMIN) 1000 MCG tablet Take 1,000 mcg by mouth daily.   vitamin E 1000 UNIT capsule Take 1,000 Units by mouth daily.   No  facility-administered encounter medications on file as of 03/05/2022.    Allergies (verified) Patient has no known allergies.   History: Past Medical History:  Diagnosis Date   Bowel trouble    Breast screening, unspecified    atypical ductal hyperplasia, declined excision.   Colon polyp 2011   COPD (chronic obstructive pulmonary disease) (HCC)    Diverticulosis    Duodenitis    GERD (gastroesophageal reflux disease)    Glaucoma    Lump or mass in breast    Personal history of tobacco use, presenting hazards to health    Special screening for malignant neoplasms, colon    Past Surgical History:  Procedure Laterality Date   BREAST BIOPSY Right 12/14/2011   3:00, Finesse biopsy,December 14, 2011. This was completed as her FNA showed atypia, papilloma, "features" of  ADH  identified. Declined formal excision. I elected not to offer chemoprevention without formal excision. .(January 13, 2012.)    COLONOSCOPY  08/20/2009   Tubular adenoma x2. Transverse and descending colon Dr. IDionne Milo  COLONOSCOPY WITH PROPOFOL N/A 04/02/2015   Tubular adenoma. F/U 2022.  Surgeon: JRobert Bellow MD;  [facility]  Service: Endoscopy;  Laterality: N/A;   COLONOSCOPY WITH PROPOFOL N/A 05/07/2020   Procedure: COLONOSCOPY WITH PROPOFOL;  Surgeon: BRobert Bellow MD;  Location: [facility] ENDOSCOPY;  Service: Endoscopy;  Laterality: N/A;   ESOPHAGOGASTRODUODENOSCOPY (EGD) WITH PROPOFOL N/A 12/30/2021   Procedure: ESOPHAGOGASTRODUODENOSCOPY (EGD) WITH PROPOFOL;  Surgeon: Robert Bellow, MD;  Location: [facility] ENDOSCOPY;  Service: Endoscopy;  Laterality: N/A;   Family History  Problem Relation Age of Onset   Heart disease Mother    COPD Mother    Hypertension Mother    Cancer Father        metastatic prostate CA   Stroke Sister        hemorrhagic CVA   Heart disease Sister        restrictive cardiomyopathy   Aneurysm Brother    Social History   Socioeconomic History   Marital  status: Widowed    Spouse name: Not on file   Number of children: Not on file   Years of education: Not on file   Highest education level: Not on file  Occupational History   Not on file  Tobacco Use   Smoking status: Former    Packs/day: 1.00    Years: 20.00    Total pack years: 20.00    Types: Cigarettes    Quit date: 05/15/2001    Years since quitting: 20.8   Smokeless tobacco: Never  Vaping Use   Vaping Use: Never used  Substance and Sexual Activity   Alcohol use: Not Currently    Alcohol/week: 3.0 standard drinks of alcohol    Types: 3 Cans of beer per week   Drug use: No   Sexual activity: Not Currently  Other Topics Concern   Not on file  Social History Narrative   Not on file   Social Determinants of Health   Financial Resource Strain: Low Risk  (03/05/2022)   Overall Financial Resource Strain (CARDIA)    Difficulty of Paying Living Expenses: Not hard at all  Food Insecurity: No Food Insecurity (03/05/2022)   Hunger Vital Sign    Worried About Running Out of Food in the Last Year: Never true    Ran Out of Food in the Last Year: Never true  Transportation Needs: No Transportation Needs (03/05/2022)   PRAPARE - Hydrologist (Medical): No    Lack of Transportation (Non-Medical): No  Physical Activity: Unknown (02/21/2018)   Exercise Vital Sign    Days of Exercise per Week: 0 days    Minutes of Exercise per Session: Not on file  Stress: No Stress Concern Present (03/05/2022)   [place]    Feeling of Stress : Not at all  Social Connections: Unknown (03/05/2022)   Social Connection and Isolation Panel [NHANES]    Frequency of Communication with Friends and Family: More than three times a week    Frequency of Social Gatherings with Friends and Family: More than three times a week    Attends Religious Services: Not on Advertising copywriter or Organizations: Not  on file    Attends Archivist Meetings: Not on file    Marital Status: Not on file    Tobacco Counseling Counseling given: Not Answered   Clinical Intake:  Pre-visit preparation completed: Yes        Diabetes: No  How often do you need to have someone help you when you read instructions, pamphlets, or other written materials from your doctor or pharmacy?: 1 - Never    Interpreter Needed?: No      Activities of Daily Living    03/05/2022   11:59  AM  In your present state of health, do you have any difficulty performing the following activities:  Hearing? 0  Vision? 0  Difficulty concentrating or making decisions? 0  Walking or climbing stairs? 0  Dressing or bathing? 0  Doing errands, shopping? 0  Preparing Food and eating ? N  Using the Toilet? N  In the past six months, have you accidently leaked urine? N  Do you have problems with loss of bowel control? N  Managing your Medications? N  Managing your Finances? N  Housekeeping or managing your Housekeeping? N    Patient Care Team: Crecencio Mc, MD as PCP - General (Internal Medicine) Bary Castilla Forest Gleason, MD (General Surgery) Crecencio Mc, MD (Internal Medicine)  Indicate any recent Medical Services you may have received from other than [facility] providers in the past year (date may be approximate).     Assessment:   This is a routine wellness examination for [place].  I connected with  Quincy Simmonds on 03/05/22 by a audio enabled telemedicine application and verified that I am speaking with the correct person using two identifiers.  Patient Location: Home  Provider Location: Office/Clinic  I discussed the limitations of evaluation and management by telemedicine. The patient expressed understanding and agreed to proceed.   Hearing/Vision screen Hearing Screening - Comments:: Patient is able to hear conversational tones without difficulty.  No issues reported.   Vision Screening -  Comments:: Wears corrective lenses They have seen their ophthalmologist in the last 12 months.  Dietary issues and exercise activities discussed: Current Exercise Habits: Home exercise routine, Intensity: Mild   Goals Addressed             This Visit's Progress    Increase physical activity       Walk for exercise as tolerated.        Depression Screen    03/05/2022   11:58 AM 01/20/2022    2:45 PM 11/18/2021   11:14 AM 11/04/2021   10:58 AM 08/18/2021    1:52 PM 03/04/2021   11:24 AM 03/03/2020   11:51 AM  PHQ 2/9 Scores  PHQ - 2 Score 0 '1 1 1 '$ 0 0 0    Fall Risk    03/05/2022   11:59 AM 01/20/2022    2:45 PM 11/18/2021   11:14 AM 11/04/2021   10:57 AM 08/18/2021    1:52 PM  [place] in the past year? 0 0 0 0 0  Number falls in past yr: 0   0   Injury with Fall? 0   0   Risk for fall due to : No Fall Risks No Fall Risks No Fall Risks No Fall Risks No Fall Risks  Follow up Falls evaluation completed;Falls prevention discussed Falls evaluation completed Falls evaluation completed Falls evaluation completed Falls evaluation completed    FALL RISK PREVENTION PERTAINING TO THE HOME: Home free of loose throw rugs in walkways, pet beds, electrical cords, etc? Yes  Adequate lighting in your home to reduce risk of falls? Yes   ASSISTIVE DEVICES UTILIZED TO PREVENT FALLS: Life alert? No  Use of a cane, walker or w/c? No  Grab bars in the bathroom? No  Shower chair or bench in shower? No  Elevated toilet seat or a handicapped toilet? No   TIMED UP AND GO: Was the test performed? No .   Cognitive Function:    02/17/2017   11:46 AM 02/18/2016   11:41 AM 02/18/2015  10:35 AM  MMSE - Mini Mental State Exam  Orientation to time '5 5 5  '$ Orientation to Place '5 5 5  '$ Registration '3 3 3  '$ Attention/ Calculation '5 5 5  '$ Recall '3 3 3  '$ Language- name 2 objects '2 2 2  '$ Language- repeat '1 1 1  '$ Language- follow 3 step command '3 3 3  '$ Language- read & follow direction '1 1 1   '$ Write a sentence '1 1 1  '$ Copy design '1 1 1  '$ Total score '30 30 30        '$ 03/05/2022   12:01 PM 03/03/2020   11:56 AM 02/23/2019    9:19 AM 02/21/2018   11:01 AM  6CIT Screen  What Year? 0 points 0 points 0 points 0 points  What month? 0 points 0 points 0 points 0 points  What time? 0 points 0 points 0 points 0 points  Count back from 20 0 points 0 points 0 points 0 points  Months in reverse 0 points 0 points 0 points 0 points  Repeat phrase 0 points 0 points 0 points 0 points  Total Score 0 points 0 points 0 points 0 points    Immunizations Immunization History  Administered Date(s) Administered   Hep A / Hep B 07/31/2014, 09/03/2014, 02/04/2015   Influenza Split 02/04/2014, 01/31/2015   Influenza, High Dose Seasonal PF 01/08/2016   Influenza,inj,Quad PF,6+ Mos 01/31/2015, 12/13/2016, 02/21/2018, 12/12/2018   Influenza-Unspecified 02/12/2013, 12/30/2016   PFIZER(Purple Top)SARS-COV-2 Vaccination 05/10/2019, 05/31/2019, 03/20/2020   Pneumococcal Conjugate-13 01/31/2015   Tdap 01/06/2010   TDAP status: Due, Education has been provided regarding the importance of this vaccine. Advised may receive this vaccine at local pharmacy or Health Dept. Aware to provide a copy of the vaccination record if obtained from local pharmacy or Health Dept. Verbalized acceptance and understanding.  Pneumococcal vaccine status: Due, Education has been provided regarding the importance of this vaccine. Advised may receive this vaccine at local pharmacy or Health Dept. Aware to provide a copy of the vaccination record if obtained from local pharmacy or Health Dept. Verbalized acceptance and understanding.  Covid-19 vaccine status: Completed vaccines x3  Screening Tests Health Maintenance  Topic Date Due   DTaP/Tdap/Td (2 - Td or Tdap) 01/07/2020   COVID-19 Vaccine (4 - 2023-24 season) 03/21/2022 (Originally 11/13/2021)   Zoster Vaccines- Shingrix (1 of 2) 04/22/2022 (Originally 08/12/1964)    INFLUENZA VACCINE  06/13/2022 (Originally 10/13/2021)   Pneumonia Vaccine 45+ Years old (2 - PPSV23 or PCV20) 03/06/2023 (Originally 01/31/2016)   MAMMOGRAM  02/04/2023   Medicare Annual Wellness (AWV)  03/06/2023   COLONOSCOPY (Pts 45-57yr Insurance coverage will need to be confirmed)  05/08/2023   DEXA SCAN  Completed   Hepatitis C Screening  Completed   HPV VACCINES  Aged Out   Health Maintenance Health Maintenance Due  Topic Date Due   DTaP/Tdap/Td (2 - Td or Tdap) 01/07/2020   Lung Cancer Screening: (Low Dose CT Chest recommended if Age 75-80years, 30 pack-year currently smoking OR have quit w/in 15years.) does not qualify.   Hepatitis C Screening: Completed 01/2015.  Vision Screening: Recommended annual ophthalmology exams for early detection of glaucoma and other disorders of the eye.  Dental Screening: Recommended annual dental exams for proper oral hygiene.  Community Resource Referral / Chronic Care Management: CRR required this visit?  No   CCM required this visit?  No      Plan:     I have personally reviewed and noted the  following in the patient's chart:   Medical and social history Use of alcohol, tobacco or illicit drugs  Current medications and supplements including opioid prescriptions. Patient is not currently taking opioid prescriptions. Functional ability and status Nutritional status Physical activity Advanced directives List of other physicians Hospitalizations, surgeries, and ER visits in previous 12 months Vitals Screenings to include cognitive, depression, and falls Referrals and appointments  In addition, I have reviewed and discussed with patient certain preventive protocols, quality metrics, and best practice recommendations. A written personalized care plan for preventive services as well as general preventive health recommendations were provided to patient.     Leta Jungling, LPN   39/05/90

## 2022-03-05 NOTE — Patient Instructions (Addendum)
Sharon Webster , Thank you for taking time to come for your Medicare Wellness Visit. I appreciate your ongoing commitment to your health goals. Please review the following plan we discussed and let me know if I can assist you in the future.   These are the goals we discussed:  Goals      DIET - REDUCE SUGAR INTAKE     Increase physical activity     Walk for exercise as tolerated.         This is a list of the screening recommended for you and due dates:  Health Maintenance  Topic Date Due   DTaP/Tdap/Td vaccine (2 - Td or Tdap) 01/07/2020   COVID-19 Vaccine (4 - 2023-24 season) 03/21/2022*   Zoster (Shingles) Vaccine (1 of 2) 04/22/2022*   Flu Shot  06/13/2022*   Pneumonia Vaccine (2 - PPSV23 or PCV20) 03/06/2023*   Mammogram  02/04/2023   Medicare Annual Wellness Visit  03/06/2023   Colon Cancer Screening  05/08/2023   DEXA scan (bone density measurement)  Completed   Hepatitis C Screening: USPSTF Recommendation to screen - Ages 51-79 yo.  Completed   HPV Vaccine  Aged Out  *Topic was postponed. The date shown is not the original due date.   Conditions/risks identified: none new  Next appointment: Follow up in one year for your annual wellness visit    Preventive Care 65 Years and Older, Female Preventive care refers to lifestyle choices and visits with your health care provider that can promote health and wellness. What does preventive care include? A yearly physical exam. This is also called an annual well check. Dental exams once or twice a year. Routine eye exams. Ask your health care provider how often you should have your eyes checked. Personal lifestyle choices, including: Daily care of your teeth and gums. Regular physical activity. Eating a healthy diet. Avoiding tobacco and drug use. Limiting alcohol use. Practicing safe sex. Taking low-dose aspirin every day. Taking vitamin and mineral supplements as recommended by your health care provider. What happens  during an annual well check? The services and screenings done by your health care provider during your annual well check will depend on your age, overall health, lifestyle risk factors, and family history of disease. Counseling  Your health care provider may ask you questions about your: Alcohol use. Tobacco use. Drug use. Emotional well-being. Home and relationship well-being. Sexual activity. Eating habits. History of falls. Memory and ability to understand (cognition). Work and work Statistician. Reproductive health. Screening  You may have the following tests or measurements: Height, weight, and BMI. Blood pressure. Lipid and cholesterol levels. These may be checked every 5 years, or more frequently if you are over 38 years old. Skin check. Lung cancer screening. You may have this screening every year starting at age 54 if you have a 30-pack-year history of smoking and currently smoke or have quit within the past 15 years. Fecal occult blood test (FOBT) of the stool. You may have this test every year starting at age 13. Flexible sigmoidoscopy or colonoscopy. You may have a sigmoidoscopy every 5 years or a colonoscopy every 10 years starting at age 28. Hepatitis C blood test. Hepatitis B blood test. Sexually transmitted disease (STD) testing. Diabetes screening. This is done by checking your blood sugar (glucose) after you have not eaten for a while (fasting). You may have this done every 1-3 years. Bone density scan. This is done to screen for osteoporosis. You may have this done starting  at age 21. Mammogram. This may be done every 1-2 years. Talk to your health care provider about how often you should have regular mammograms. Talk with your health care provider about your test results, treatment options, and if necessary, the need for more tests. Vaccines  Your health care provider may recommend certain vaccines, such as: Influenza vaccine. This is recommended every  year. Tetanus, diphtheria, and acellular pertussis (Tdap, Td) vaccine. You may need a Td booster every 10 years. Zoster vaccine. You may need this after age 22. Pneumococcal 13-valent conjugate (PCV13) vaccine. One dose is recommended after age 41. Pneumococcal polysaccharide (PPSV23) vaccine. One dose is recommended after age 15. Talk to your health care provider about which screenings and vaccines you need and how often you need them. This information is not intended to replace advice given to you by your health care provider. Make sure you discuss any questions you have with your health care provider. Document Released: 03/28/2015 Document Revised: 11/19/2015 Document Reviewed: 12/31/2014 Elsevier Interactive Patient Education  2017 [place] Prevention in the Home Falls can cause injuries. They can happen to people of all ages. There are many things you can do to make your home safe and to help prevent falls. What can I do on the outside of my home? Regularly fix the edges of walkways and driveways and fix any cracks. Remove anything that might make you trip as you walk through a door, such as a raised step or threshold. Trim any bushes or trees on the path to your home. Use bright outdoor lighting. Clear any walking paths of anything that might make someone trip, such as rocks or tools. Regularly check to see if handrails are loose or broken. Make sure that both sides of any steps have handrails. Any raised decks and porches should have guardrails on the edges. Have any leaves, snow, or ice cleared regularly. Use sand or salt on walking paths during winter. Clean up any spills in your garage right away. This includes oil or grease spills. What can I do in the bathroom? Use night lights. Install grab bars by the toilet and in the tub and shower. Do not use towel bars as grab bars. Use non-skid mats or decals in the tub or shower. If you need to sit down in the shower, use a  plastic, non-slip stool. Keep the floor dry. Clean up any water that spills on the floor as soon as it happens. Remove soap buildup in the tub or shower regularly. Attach bath mats securely with double-sided non-slip rug tape. Do not have throw rugs and other things on the floor that can make you trip. What can I do in the bedroom? Use night lights. Make sure that you have a light by your bed that is easy to reach. Do not use any sheets or blankets that are too big for your bed. They should not hang down onto the floor. Have a firm chair that has side arms. You can use this for support while you get dressed. Do not have throw rugs and other things on the floor that can make you trip. What can I do in the kitchen? Clean up any spills right away. Avoid walking on wet floors. Keep items that you use a lot in easy-to-reach places. If you need to reach something above you, use a strong step stool that has a grab bar. Keep electrical cords out of the way. Do not use floor polish or wax that makes  floors slippery. If you must use wax, use non-skid floor wax. Do not have throw rugs and other things on the floor that can make you trip. What can I do with my stairs? Do not leave any items on the stairs. Make sure that there are handrails on both sides of the stairs and use them. Fix handrails that are broken or loose. Make sure that handrails are as long as the stairways. Check any carpeting to make sure that it is firmly attached to the stairs. Fix any carpet that is loose or worn. Avoid having throw rugs at the top or bottom of the stairs. If you do have throw rugs, attach them to the floor with carpet tape. Make sure that you have a light switch at the top of the stairs and the bottom of the stairs. If you do not have them, ask someone to add them for you. What else can I do to help prevent falls? Wear shoes that: Do not have high heels. Have rubber bottoms. Are comfortable and fit you  well. Are closed at the toe. Do not wear sandals. If you use a stepladder: Make sure that it is fully opened. Do not climb a closed stepladder. Make sure that both sides of the stepladder are locked into place. Ask someone to hold it for you, if possible. Clearly mark and make sure that you can see: Any grab bars or handrails. First and last steps. Where the edge of each step is. Use tools that help you move around (mobility aids) if they are needed. These include: Canes. Walkers. Scooters. Crutches. Turn on the lights when you go into a dark area. Replace any light bulbs as soon as they burn out. Set up your furniture so you have a clear path. Avoid moving your furniture around. If any of your floors are uneven, fix them. If there are any pets around you, be aware of where they are. Review your medicines with your doctor. Some medicines can make you feel dizzy. This can increase your chance of falling. Ask your doctor what other things that you can do to help prevent falls. This information is not intended to replace advice given to you by your health care provider. Make sure you discuss any questions you have with your health care provider. Document Released: 12/26/2008 Document Revised: 08/07/2015 Document Reviewed: 04/05/2014 Elsevier Interactive Patient Education  2017 [employer].

## 2022-03-22 ENCOUNTER — Ambulatory Visit: Payer: Medicare PPO

## 2022-04-12 DIAGNOSIS — H401134 Primary open-angle glaucoma, bilateral, indeterminate stage: Secondary | ICD-10-CM | POA: Diagnosis not present

## 2022-04-12 DIAGNOSIS — H2513 Age-related nuclear cataract, bilateral: Secondary | ICD-10-CM | POA: Diagnosis not present

## 2022-04-12 DIAGNOSIS — H52223 Regular astigmatism, bilateral: Secondary | ICD-10-CM | POA: Diagnosis not present

## 2022-04-30 ENCOUNTER — Other Ambulatory Visit: Payer: Self-pay | Admitting: Family

## 2022-05-19 ENCOUNTER — Other Ambulatory Visit: Payer: Self-pay | Admitting: Internal Medicine

## 2022-05-19 NOTE — Telephone Encounter (Signed)
Prescription Request  05/19/2022  LOV: 01/20/2022  What is the name of the medication or equipment? amLODipine (NORVASC) 5 MG tablet, atorvastatin (LIPITOR) 20 MG tablet and pantoprazole (PROTONIX) 40 MG tablet  Have you contacted your pharmacy to request a refill? Yes   Which pharmacy would you like this sent to?   [facility] [id number], [place] AT [facility] OF [address] [place] [address] Phone: [phone] Fax: [phone]    Patient notified that their request is being sent to the clinical staff for review and that they should receive a response within 2 business days.   Please advise at Mobile [phone] (mobile)

## 2022-05-21 ENCOUNTER — Telehealth: Payer: Self-pay | Admitting: Family

## 2022-05-24 ENCOUNTER — Other Ambulatory Visit: Payer: Self-pay

## 2022-05-24 MED ORDER — ATORVASTATIN CALCIUM 20 MG PO TABS: 20.0000 mg | ORAL_TABLET | Freq: Every day | ORAL | 1 refills | Status: DC

## 2022-05-24 MED ORDER — PANTOPRAZOLE SODIUM 40 MG PO TBEC: DELAYED_RELEASE_TABLET | ORAL | 1 refills | Status: DC

## 2022-05-24 NOTE — Telephone Encounter (Signed)
Pt need a refill on Pantoprazole and atorvastatin sent to [facility]

## 2022-05-24 NOTE — Telephone Encounter (Signed)
sent 

## 2022-07-21 ENCOUNTER — Ambulatory Visit: Payer: Medicare PPO | Admitting: Internal Medicine

## 2022-07-21 ENCOUNTER — Encounter: Payer: Self-pay | Admitting: Internal Medicine

## 2022-07-21 VITALS — BP 152/66 | HR 82 | Temp 98.0°F | Ht 60.0 in | Wt 118.8 lb

## 2022-07-21 DIAGNOSIS — M65331 Trigger finger, right middle finger: Secondary | ICD-10-CM | POA: Diagnosis not present

## 2022-07-21 DIAGNOSIS — R351 Nocturia: Secondary | ICD-10-CM

## 2022-07-21 DIAGNOSIS — N289 Disorder of kidney and ureter, unspecified: Secondary | ICD-10-CM | POA: Diagnosis not present

## 2022-07-21 DIAGNOSIS — I10 Essential (primary) hypertension: Secondary | ICD-10-CM

## 2022-07-21 DIAGNOSIS — E7849 Other hyperlipidemia: Secondary | ICD-10-CM

## 2022-07-21 DIAGNOSIS — R7303 Prediabetes: Secondary | ICD-10-CM | POA: Diagnosis not present

## 2022-07-21 DIAGNOSIS — R5383 Other fatigue: Secondary | ICD-10-CM | POA: Diagnosis not present

## 2022-07-21 DIAGNOSIS — K2950 Unspecified chronic gastritis without bleeding: Secondary | ICD-10-CM | POA: Diagnosis not present

## 2022-07-21 LAB — CBC WITH DIFFERENTIAL/PLATELET
Basophils Absolute: 0 10*3/uL (ref 0.0–0.1)
Basophils Relative: 0.6 % (ref 0.0–3.0)
Eosinophils Absolute: 0 10*3/uL (ref 0.0–0.7)
Eosinophils Relative: 0.8 % (ref 0.0–5.0)
HCT: 38.1 % (ref 36.0–46.0)
Hemoglobin: 12.7 g/dL (ref 12.0–15.0)
Lymphocytes Relative: 27 % (ref 12.0–46.0)
Lymphs Abs: 1.3 10*3/uL (ref 0.7–4.0)
MCHC: 33.3 g/dL (ref 30.0–36.0)
MCV: 86 fl (ref 78.0–100.0)
Monocytes Absolute: 0.4 10*3/uL (ref 0.1–1.0)
Monocytes Relative: 7.4 % (ref 3.0–12.0)
Neutro Abs: 3.2 10*3/uL (ref 1.4–7.7)
Neutrophils Relative %: 64.2 % (ref 43.0–77.0)
Platelets: 245 10*3/uL (ref 150.0–400.0)
RBC: 4.43 Mil/uL (ref 3.87–5.11)
RDW: 13 % (ref 11.5–15.5)
WBC: 5 10*3/uL (ref 4.0–10.5)

## 2022-07-21 LAB — URINALYSIS, ROUTINE W REFLEX MICROSCOPIC
Bilirubin Urine: NEGATIVE
Hgb urine dipstick: NEGATIVE
Ketones, ur: NEGATIVE
Leukocytes,Ua: NEGATIVE
Nitrite: NEGATIVE
RBC / HPF: NONE SEEN (ref 0–?)
Specific Gravity, Urine: 1.01 (ref 1.000–1.030)
Total Protein, Urine: NEGATIVE
Urine Glucose: NEGATIVE
Urobilinogen, UA: 0.2 (ref 0.0–1.0)
WBC, UA: NONE SEEN (ref 0–?)
pH: 8 (ref 5.0–8.0)

## 2022-07-21 LAB — LIPID PANEL
Cholesterol: 153 mg/dL (ref 0–200)
HDL: 55.6 mg/dL (ref >39.00)
LDL Cholesterol: 70 mg/dL (ref 0–99)
NonHDL: 96.93
Total CHOL/HDL Ratio: 3
Triglycerides: 133 mg/dL (ref 0.0–149.0)
VLDL: 26.6 mg/dL (ref 0.0–40.0)

## 2022-07-21 LAB — COMPREHENSIVE METABOLIC PANEL
ALT: 14 U/L (ref 0–35)
AST: 17 U/L (ref 0–37)
Albumin: 4.4 g/dL (ref 3.5–5.2)
Alkaline Phosphatase: 87 U/L (ref 39–117)
BUN: 11 mg/dL (ref 6–23)
CO2: 29 mEq/L (ref 19–32)
Calcium: 9.9 mg/dL (ref 8.4–10.5)
Chloride: 102 mEq/L (ref 96–112)
Creatinine, Ser: 0.86 mg/dL (ref 0.40–1.20)
GFR: 65.85 mL/min (ref >60.00)
Glucose, Bld: 98 mg/dL (ref 70–99)
Potassium: 4.1 mEq/L (ref 3.5–5.1)
Sodium: 141 mEq/L (ref 135–145)
Total Bilirubin: 0.7 mg/dL (ref 0.2–1.2)
Total Protein: 7 g/dL (ref 6.0–8.3)

## 2022-07-21 LAB — MICROALBUMIN / CREATININE URINE RATIO
Creatinine,U: 48.2 mg/dL
Microalb Creat Ratio: 1.5 mg/g (ref 0.0–30.0)
Microalb, Ur: <0.7 mg/dL (ref 0.0–1.9)

## 2022-07-21 LAB — TSH: TSH: 0.76 u[IU]/mL (ref 0.35–5.50)

## 2022-07-21 LAB — LDL CHOLESTEROL, DIRECT: Direct LDL: 75 mg/dL

## 2022-07-21 LAB — HEMOGLOBIN A1C: Hgb A1c MFr Bld: 6 % (ref 4.6–6.5)

## 2022-07-21 MED ORDER — HYDROCHLOROTHIAZIDE 12.5 MG PO CAPS: 12.5000 mg | ORAL_CAPSULE | Freq: Every day | ORAL | 1 refills | Status: DC

## 2022-07-21 NOTE — Progress Notes (Unsigned)
Subjective:  Patient ID: Sharon Webster, female    DOB: September 05, 1945  Age: 77 y.o. MRN: [id number]  CC: The primary encounter diagnosis was Essential hypertension. Diagnoses of Familial hyperlipidemia, high LDL, Prediabetes, Other fatigue, Chronic gastritis without bleeding, unspecified gastritis type, Nocturia, Trigger finger, right middle finger, and Renal lesion were also pertinent to this visit.   HPI Sharon Webster presents for  Chief Complaint  Patient presents with   Medical Management of Chronic Issues    6 month follow up    1) HTN:  did not tolerate 5 mg amlodipine dose secondary to recurrent dizziness . Taking 1/2 tablet.   measuring her BP at home but not recently.  Stays active, walking daily .   2) grieving the loss of an elderly friend she made at the "[facility] " she visits  weekly ([place] in [place])  3) nocturia:  3 times per night.  Reviewed diet:  drinks coffee and tea at night  4) gastritis:  taking 2 PPIs for unclear reasongs. Prevacid too expensive  Outpatient Medications Prior to Visit  Medication Sig Dispense Refill   amLODipine (NORVASC) 5 MG tablet TAKE 1 TABLET(5 MG) BY MOUTH DAILY (Patient taking differently: Take 2.5 mg by mouth daily.) 90 tablet 1   atorvastatin (LIPITOR) 20 MG tablet Take 1 tablet (20 mg total) by mouth daily. 90 tablet 1   cholecalciferol (VITAMIN D3) 25 MCG (1000 UT) tablet Take 1,000 Units by mouth daily.     metoCLOPramide (REGLAN) 5 MG tablet Take 1 tablet (5 mg total) by mouth 3 (three) times daily before meals. 90 tablet 0   pantoprazole (PROTONIX) 40 MG tablet TAKE 1 TABLET(40 MG) BY MOUTH DAILY 90 tablet 1   vitamin B-12 (CYANOCOBALAMIN) 1000 MCG tablet Take 1,000 mcg by mouth daily.     vitamin E 1000 UNIT capsule Take 1,000 Units by mouth daily.     lansoprazole (PREVACID) 30 MG capsule Take 30 mg by mouth daily at 12 noon.     Calcium & Magnesium Carbonates (MYLANTA PO) Take by mouth. (Patient not taking:  Reported on 07/21/2022)     ondansetron (ZOFRAN-ODT) 4 MG disintegrating tablet Take 1 tablet (4 mg total) by mouth every 8 (eight) hours as needed for nausea or vomiting. (Patient not taking: Reported on 01/20/2022) 20 tablet 0   No facility-administered medications prior to visit.    Review of Systems;  Patient denies headache, fevers, malaise, unintentional weight loss, skin rash, eye pain, sinus congestion and sinus pain, sore throat, dysphagia,  hemoptysis , cough, dyspnea, wheezing, chest pain, palpitations, orthopnea, edema, abdominal pain, nausea, melena, diarrhea, constipation, flank pain, dysuria, hematuria, urinary  Frequency, nocturia, numbness, tingling, seizures,  Focal weakness, Loss of consciousness,  Tremor, insomnia, depression, anxiety, and suicidal ideation.      Objective:  BP (!) 152/66   Pulse 82   Temp 98 F (36.7 C) (Oral)   Ht 5' (1.524 m)   Wt 118 lb 12.8 oz (53.9 kg)   SpO2 98%   BMI 23.20 kg/m   BP Readings from Last 3 Encounters:  07/21/22 (!) 152/66  01/20/22 124/72  12/30/21 134/76    Wt Readings from Last 3 Encounters:  07/21/22 118 lb 12.8 oz (53.9 kg)  03/05/22 118 lb (53.5 kg)  01/20/22 118 lb 3.2 oz (53.6 kg)    Physical Exam Vitals reviewed.  Constitutional:      General: She is not in acute distress.    Appearance: Normal appearance. She is  normal weight. She is not ill-appearing, toxic-appearing or diaphoretic.  HENT:     Head: Normocephalic.  Eyes:     General: No scleral icterus.       Right eye: No discharge.        Left eye: No discharge.     Conjunctiva/sclera: Conjunctivae normal.  Cardiovascular:     Rate and Rhythm: Normal rate and regular rhythm.     Heart sounds: Normal heart sounds.  Pulmonary:     Effort: Pulmonary effort is normal. No respiratory distress.     Breath sounds: Normal breath sounds.  Musculoskeletal:        General: Normal range of motion.  Skin:    General: Skin is warm and dry.  Neurological:      General: No focal deficit present.     Mental Status: She is alert and oriented to person, place, and time. Mental status is at baseline.  Psychiatric:        Mood and Affect: Mood normal.        Behavior: Behavior normal.        Thought Content: Thought content normal.        Judgment: Judgment normal.    Lab Results  Component Value Date   HGBA1C 6.1 08/18/2021   HGBA1C 6.0 02/28/2019   HGBA1C 5.9 04/17/2018    Lab Results  Component Value Date   CREATININE 0.89 11/18/2021   CREATININE 0.78 11/11/2021   CREATININE 0.81 10/27/2021    Lab Results  Component Value Date   WBC 5.8 11/11/2021   HGB 12.2 11/11/2021   HCT 36.5 11/11/2021   PLT 259 11/11/2021   GLUCOSE 92 11/18/2021   CHOL 200 08/18/2021   TRIG 153.0 (H) 08/18/2021   HDL 54.80 08/18/2021   LDLDIRECT 121.0 08/18/2021   LDLCALC 115 (H) 08/18/2021   ALT 13 11/11/2021   AST 21 11/11/2021   NA 138 11/18/2021   K 3.4 (L) 11/18/2021   CL 99 11/18/2021   CREATININE 0.89 11/18/2021   BUN 11 11/18/2021   CO2 30 11/18/2021   TSH 1.33 08/18/2021   HGBA1C 6.1 08/18/2021   MICROALBUR <0.7 08/18/2021    MR ABDOMEN WWO CONTRAST  Result Date: 02/11/2022 CLINICAL DATA:  77 year old female with history of indeterminate renal lesions noted on prior CT examination. Follow-up study. EXAM: MRI ABDOMEN WITHOUT AND WITH CONTRAST TECHNIQUE: Multiplanar multisequence MR imaging of the abdomen was performed both before and after the administration of intravenous contrast. CONTRAST:  6mL GADAVIST GADOBUTROL 1 MMOL/ML IV SOLN COMPARISON:  No prior abdominal MRI. CT of the abdomen and pelvis 11/11/2021. FINDINGS: Lower chest: Unremarkable. Hepatobiliary: No suspicious cystic or solid hepatic lesions. No intra or extrahepatic biliary ductal dilatation. Gallbladder is normal in appearance. Common bile duct measures 4 mm in the porta hepatis. Pancreas: No pancreatic mass. No pancreatic ductal dilatation. No pancreatic or peripancreatic  fluid collections or inflammatory changes. Spleen:  Unremarkable. Adrenals/Urinary Tract: In the lateral aspect of the interpolar region of the left kidney (axial image 48 of series 13) there is a 1.1 x 1.0 cm T1 hyperintense, T2 hypointense nonenhancing lesion, compatible with a proteinaceous/hemorrhagic cysts (Bosniak class 2). Several other lesions are noted in the kidneys bilaterally, T1 hypointense, T2 hyperintense, without internal enhancement or definite soft tissue components, compatible with simple (Bosniak class 1) cysts, largest of which is in the upper pole of the right kidney (axial image 17 of series 4) measuring 1.8 cm. No suspicious renal lesions are noted. No hydroureteronephrosis  in the visualized portions of the abdomen. Stomach/Bowel: Visualized portions are unremarkable. Vascular/Lymphatic: No aneurysm identified in the visualized abdominal vasculature. No lymphadenopathy noted in the abdomen. Other: No significant volume of ascites noted in the visualized portions of the peritoneal cavity. Musculoskeletal: No aggressive appearing osseous lesions are noted in the visualized portions of the skeleton. IMPRESSION: 1. Multiple renal lesions bilaterally, with imaging characteristics compatible with a combination of Bosniak class 1 and Bosniak class 2 cysts (these are benign and requiring no future imaging follow-up). Electronically Signed   By: Trudie Reed M.D.   On: 02/11/2022 10:22    Assessment & Plan:  .Essential hypertension Assessment & Plan: She has reduce amlodipine dose to 2.5 mg due to dizziness with 5 mg dose.  Advised to take qhs and adding 12.5 mg hctz in the morning   Orders: -     Comprehensive metabolic panel -     Microalbumin / creatinine urine ratio  Familial hyperlipidemia, high LDL -     Lipid panel -     LDL cholesterol, direct -     Comprehensive metabolic panel -     Hemoglobin A1c  Prediabetes -     Comprehensive metabolic panel -     Hemoglobin  A1c  Other fatigue -     TSH -     CBC with Differential/Platelet  Chronic gastritis without bleeding, unspecified gastritis type Assessment & Plan: S/P normal EGD biopsies Oct 2023 .  Adbvised to CONTINUE PANTOPRAZOLE  and avoid NSAIDS   Nocturia Assessment & Plan: Ruling out UTI.  Needs to omit caffeinated beverages and limit evening liquids  Orders: -     Urinalysis, Routine w reflex microscopic -     Urine Culture  Trigger finger, right middle finger Assessment & Plan: Mild,  symptoms present for the past week .  Advised to massage palm and apply ice. NSAIDS c/i due  to chronic gastritis    Renal lesion Assessment & Plan: MRI abdomen ordered to rule out renal cell CA reviewed:  benign cysts; no follow up needed    Other orders -     hydroCHLOROthiazide; Take 1 capsule (12.5 mg total) by mouth daily. In the morning  Dispense: 90 capsule; Refill: 1     I provided 27 minutes of face-to-face time during this encounter reviewing patient's last visit with me, patient's  most recent visit with cardiology,  nephrology,  and neurology,  recent endoscopy , previous  labs and  MRI abdomen,  counseling on currently addressed issues,  and post visit ordering to diagnostics and therapeutics .   Follow-up: No follow-ups on file.   Sherlene Shams, MD

## 2022-07-21 NOTE — Assessment & Plan Note (Signed)
She has reduce amlodipine dose to 2.5 mg due to dizziness with 5 mg dose.  Advised to take qhs and adding 12.5 mg hctz in the morning

## 2022-07-21 NOTE — Patient Instructions (Addendum)
Your blood pressure is TOO HIGH .   It should be < 130/80  Please start taking the AMLODIPINE (2.5 MG)  AT NIGHT,  AND I AM ADDING 12.5 mg HCTZ TO TAKE IN THE MORNING when you wake up  You are  OVERDUE  for your tetanus-diptheria-pertussis vaccine   (TDaP)   You can  GET IIT FOR FREE  at your pharmacy    NO Caffeine after 3 pm  this is aggravating your nighttime urinary frequency  Limit fluids after dinner to sips of water .   Try to get 48 to 60 ounces of water daily  (BEFORE 7 PM)   YOU ONLY NEED ONE STOMACH MEDICINE:  PANTOPRAZOLE IS CHEAPER AND COVERED BY INSURANCE    YOUR TRIGGER FINGER CAN BE MANAGED CURRENTLY WITH ICE

## 2022-07-21 NOTE — Assessment & Plan Note (Addendum)
S/P normal EGD biopsies Oct 2023 .  Adbvised to CONTINUE PANTOPRAZOLE  and avoid NSAIDS

## 2022-07-21 NOTE — Assessment & Plan Note (Signed)
Mild,  symptoms present for the past week .  Advised to massage palm and apply ice. NSAIDS c/i due  to chronic gastritis

## 2022-07-21 NOTE — Assessment & Plan Note (Signed)
MRI abdomen ordered to rule out renal cell CA reviewed:  benign cysts; no follow up needed

## 2022-07-21 NOTE — Assessment & Plan Note (Signed)
Ruling out UTI.  Needs to omit caffeinated beverages and limit evening liquids

## 2022-07-23 ENCOUNTER — Telehealth: Payer: Self-pay

## 2022-07-23 NOTE — Telephone Encounter (Signed)
LMTCB I regards to lab results.

## 2022-07-23 NOTE — Telephone Encounter (Signed)
-----   Message from Sherlene Shams, MD sent at 07/23/2022 10:06 AM EDT ----- All labs and urinalyis are normal

## 2022-07-23 NOTE — Telephone Encounter (Signed)
Pt called back and I read the message to her and she verbalized understanding 

## 2022-07-23 NOTE — Telephone Encounter (Signed)
noted 

## 2022-07-24 LAB — URINE CULTURE
MICRO NUMBER:: 14930159
SPECIMEN QUALITY:: ADEQUATE

## 2022-10-17 ENCOUNTER — Other Ambulatory Visit: Payer: Self-pay | Admitting: Internal Medicine

## 2022-11-24 ENCOUNTER — Telehealth: Payer: Self-pay

## 2022-11-24 NOTE — Patient Outreach (Signed)
  Care Coordination   Initial Visit Note   11/24/2022 Name: Sharon Webster MRN: [id number] DOB: [birth year]  Sharon Webster is a 77 y.o. year old female who sees Darrick Huntsman, Mar Daring, MD for primary care. I spoke with  Lauris Chroman by phone today.  What matters to the patients health and wellness today?  CM educated patient on [facility] services.  Patient declines services and feels that she is able to manage her medical needs.  Patient agreed to contact her provider in the future if she needs [facility] services.    Goals Addressed             This Visit's Progress    Care Coordination       Interventions Today    Flowsheet Row Most Recent Value  General Interventions   General Interventions Discussed/Reviewed General Interventions Discussed, General Interventions Reviewed  [Pt reports she does not struggle with transportation, food or medication.]  Education Interventions   Education Provided Provided Education  [Pt is provided education on Care Coordination services]              SDOH assessments and interventions completed:  No     Care Coordination Interventions:  Yes, provided    Follow up plan: No further intervention required.   Encounter Outcome:  Patient Refused

## 2023-01-11 ENCOUNTER — Other Ambulatory Visit: Payer: Self-pay | Admitting: Internal Medicine

## 2023-01-21 ENCOUNTER — Ambulatory Visit (INDEPENDENT_AMBULATORY_CARE_PROVIDER_SITE_OTHER): Payer: Medicare PPO | Admitting: Internal Medicine

## 2023-01-21 ENCOUNTER — Encounter: Payer: Self-pay | Admitting: Internal Medicine

## 2023-01-21 VITALS — BP 124/67 | HR 70 | Ht 60.0 in | Wt 119.6 lb

## 2023-01-21 DIAGNOSIS — Z23 Encounter for immunization: Secondary | ICD-10-CM | POA: Diagnosis not present

## 2023-01-21 DIAGNOSIS — Z860100 Personal history of colon polyps, unspecified: Secondary | ICD-10-CM | POA: Diagnosis not present

## 2023-01-21 DIAGNOSIS — M8588 Other specified disorders of bone density and structure, other site: Secondary | ICD-10-CM

## 2023-01-21 DIAGNOSIS — Z0000 Encounter for general adult medical examination without abnormal findings: Secondary | ICD-10-CM

## 2023-01-21 DIAGNOSIS — E7849 Other hyperlipidemia: Secondary | ICD-10-CM

## 2023-01-21 DIAGNOSIS — Z1231 Encounter for screening mammogram for malignant neoplasm of breast: Secondary | ICD-10-CM

## 2023-01-21 DIAGNOSIS — K2950 Unspecified chronic gastritis without bleeding: Secondary | ICD-10-CM | POA: Diagnosis not present

## 2023-01-21 DIAGNOSIS — R7303 Prediabetes: Secondary | ICD-10-CM

## 2023-01-21 DIAGNOSIS — D519 Vitamin B12 deficiency anemia, unspecified: Secondary | ICD-10-CM | POA: Diagnosis not present

## 2023-01-21 DIAGNOSIS — T50905A Adverse effect of unspecified drugs, medicaments and biological substances, initial encounter: Secondary | ICD-10-CM

## 2023-01-21 DIAGNOSIS — D126 Benign neoplasm of colon, unspecified: Secondary | ICD-10-CM | POA: Diagnosis not present

## 2023-01-21 DIAGNOSIS — E538 Deficiency of other specified B group vitamins: Secondary | ICD-10-CM

## 2023-01-21 LAB — COMPREHENSIVE METABOLIC PANEL
ALT: 13 U/L (ref 0–35)
AST: 17 U/L (ref 0–37)
Albumin: 4.5 g/dL (ref 3.5–5.2)
Alkaline Phosphatase: 80 U/L (ref 39–117)
BUN: 13 mg/dL (ref 6–23)
CO2: 32 meq/L (ref 19–32)
Calcium: 9.8 mg/dL (ref 8.4–10.5)
Chloride: 99 meq/L (ref 96–112)
Creatinine, Ser: 0.89 mg/dL (ref 0.40–1.20)
GFR: 62.97 mL/min (ref >60.00)
Glucose, Bld: 101 mg/dL — ABNORMAL HIGH (ref 70–99)
Potassium: 3.8 meq/L (ref 3.5–5.1)
Sodium: 139 meq/L (ref 135–145)
Total Bilirubin: 0.8 mg/dL (ref 0.2–1.2)
Total Protein: 6.9 g/dL (ref 6.0–8.3)

## 2023-01-21 LAB — VITAMIN B12: Vitamin B-12: >1537 pg/mL — ABNORMAL HIGH (ref 211–911)

## 2023-01-21 LAB — LDL CHOLESTEROL, DIRECT: Direct LDL: 78 mg/dL

## 2023-01-21 LAB — LIPID PANEL
Cholesterol: 153 mg/dL (ref 0–200)
HDL: 54.3 mg/dL (ref >39.00)
LDL Cholesterol: 79 mg/dL (ref 0–99)
NonHDL: 99.19
Total CHOL/HDL Ratio: 3
Triglycerides: 103 mg/dL (ref 0.0–149.0)
VLDL: 20.6 mg/dL (ref 0.0–40.0)

## 2023-01-21 LAB — HEMOGLOBIN A1C: Hgb A1c MFr Bld: 6 % (ref 4.6–6.5)

## 2023-01-21 NOTE — Progress Notes (Unsigned)
Patient ID: Sharon Webster, female    DOB: [birth year]  Age: 77 y.o. MRN: [id number]  The patient is here for annual  preventive  examination and management of other chronic and acute problems.   The risk factors are reflected in the social history.  The roster of all physicians providing medical care to patient - is listed in the Snapshot section of the chart.  Activities of daily living:  The patient is 100% independent in all ADLs: dressing, toileting, feeding as well as independent mobility  Home safety : The patient has smoke detectors in the home. They wear seatbelts.  There are no firearms at home. There is no violence in the home.   There is no risks for hepatitis, STDs or HIV. There is no   history of blood transfusion. They have no travel history to infectious disease endemic areas of the world.  The patient has seen their dentist in the last six month. They have seen their eye doctor in the last year. They admit to slight hearing difficulty with regard to whispered voices and some television programs.  They have deferred audiologic testing in the last year.  They do not  have excessive sun exposure. Discussed the need for sun protection: hats, long sleeves and use of sunscreen if there is significant sun exposure.   Diet: the importance of a healthy diet is discussed. They do have a healthy diet.  The benefits of regular aerobic exercise were discussed. She walks 4 times per week ,  20 minutes.   Depression screen: there are no signs or vegative symptoms of depression- irritability, change in appetite, anhedonia, sadness/tearfullness.  Cognitive assessment: the patient manages all their financial and personal affairs and is actively engaged. They could relate day,date,year and events; recalled 2/3 objects at 3 minutes; performed clock-face test normally.  The following portions of the patient's history were reviewed and updated as appropriate: allergies, current medications, past  family history, past medical history,  past surgical history, past social history  and problem list.  Visual acuity was not assessed per patient preference since she has regular follow up with her ophthalmologist. Hearing and body mass index were assessed and reviewed.   During the course of the visit the patient was educated and counseled about appropriate screening and preventive services including : fall prevention , diabetes screening, nutrition counseling, colorectal cancer screening, and recommended immunizations.    CC: The primary encounter diagnosis was Encounter for screening mammogram for malignant neoplasm of breast. Diagnoses of History of colonic polyps, Serrated adenoma of colon, Prediabetes, Drug-induced vitamin B12 deficiency anemia, Familial hyperlipidemia, high LDL, B12 deficiency, Chronic gastritis without bleeding, unspecified gastritis type, Osteopenia of lumbar spine, Need for influenza vaccination, and Encounter for preventive health examination were also pertinent to this visit.  1) HTN: checking home BPs all < 130/80   2) due for 3 yr follow up colonoscopy Feb 2025 due to removal of 1.2 cm serrated polyp  3)  sleep derived, new puppy 1 month ago    4) Chronic gastritis by Oct 2023 EGD:  symptoms controlled with pantoprazole.   5) early cataract right eye    History Sharon Webster has a past medical history of Bowel trouble, Breast screening, unspecified, Colon polyp (03/15/2009), COPD (chronic obstructive pulmonary disease) (HCC), Diverticulosis, Duodenitis, GERD (gastroesophageal reflux disease), Glaucoma, Lump or mass in breast, Pap smear abnormality of cervix/human papillomavirus (HPV) positive (02/02/2015), Personal history of tobacco use, presenting hazards to health, and Special screening for malignant neoplasms, colon.  She has a past surgical history that includes Colonoscopy (08/20/2009); Breast biopsy (Right, 12/14/2011); Colonoscopy with propofol (N/A,  04/02/2015); Colonoscopy with propofol (N/A, 05/07/2020); and Esophagogastroduodenoscopy (egd) with propofol (N/A, 12/30/2021).   Her family history includes Aneurysm in her brother; COPD in her mother; Cancer in her father; Heart disease in her mother and sister; Hypertension in her mother; Stroke in her sister.She reports that she quit smoking about 21 years ago. Her smoking use included cigarettes. She started smoking about 41 years ago. She has a 20 pack-year smoking history. She has never used smokeless tobacco. She reports that she does not currently use alcohol after a past usage of about 3.0 standard drinks of alcohol per week. She reports that she does not use drugs.  Outpatient Medications Prior to Visit  Medication Sig Dispense Refill   atorvastatin (LIPITOR) 20 MG tablet TAKE 1 TABLET(20 MG) BY MOUTH DAILY 90 tablet 1   cholecalciferol (VITAMIN D3) 25 MCG (1000 UT) tablet Take 1,000 Units by mouth daily.     hydrochlorothiazide (MICROZIDE) 12.5 MG capsule TAKE 1 CAPSULE(12.5 MG) BY MOUTH DAILY IN THE MORNING 90 capsule 1   pantoprazole (PROTONIX) 40 MG tablet TAKE 1 TABLET(40 MG) BY MOUTH DAILY 90 tablet 1   vitamin B-12 (CYANOCOBALAMIN) 1000 MCG tablet Take 1,000 mcg by mouth daily.     vitamin E 1000 UNIT capsule Take 1,000 Units by mouth daily.     amLODipine (NORVASC) 5 MG tablet TAKE 1 TABLET(5 MG) BY MOUTH DAILY (Patient not taking: Reported on 01/21/2023) 90 tablet 1   metoCLOPramide (REGLAN) 5 MG tablet Take 1 tablet (5 mg total) by mouth 3 (three) times daily before meals. 90 tablet 0   No facility-administered medications prior to visit.    Review of Systems  Patient denies headache, fevers, malaise, unintentional weight loss, skin rash, eye pain, sinus congestion and sinus pain, sore throat, dysphagia,  hemoptysis , cough, dyspnea, wheezing, chest pain, palpitations, orthopnea, edema, abdominal pain, nausea, melena, diarrhea, constipation, flank pain, dysuria, hematuria,  urinary  Frequency, nocturia, numbness, tingling, seizures,  Focal weakness, Loss of consciousness,  Tremor, insomnia, depression, anxiety, and suicidal ideation.     Objective:  BP 124/67   Pulse 70   Ht 5' (1.524 m)   Wt 119 lb 9.6 oz (54.3 kg)   SpO2 99%   BMI 23.36 kg/m   Physical Exam Vitals reviewed.  Constitutional:      General: She is not in acute distress.    Appearance: Normal appearance. She is well-developed and normal weight. She is not ill-appearing, toxic-appearing or diaphoretic.  HENT:     Head: Normocephalic.     Right Ear: Tympanic membrane, ear canal and external ear normal. There is no impacted cerumen.     Left Ear: Tympanic membrane, ear canal and external ear normal. There is no impacted cerumen.     Nose: Nose normal.     Mouth/Throat:     Mouth: Mucous membranes are moist.     Pharynx: Oropharynx is clear.  Eyes:     General: No scleral icterus.       Right eye: No discharge.        Left eye: No discharge.     Conjunctiva/sclera: Conjunctivae normal.     Pupils: Pupils are equal, round, and reactive to light.  Neck:     Thyroid: No thyromegaly.     Vascular: No carotid bruit or JVD.  Cardiovascular:     Rate and Rhythm: Normal rate and  regular rhythm.     Heart sounds: Normal heart sounds.  Pulmonary:     Effort: Pulmonary effort is normal. No respiratory distress.     Breath sounds: Normal breath sounds.  Chest:  Breasts:    Breasts are symmetrical.     Right: Normal. No swelling, inverted nipple, mass, nipple discharge, skin change or tenderness.     Left: Normal. No swelling, inverted nipple, mass, nipple discharge, skin change or tenderness.  Abdominal:     General: Bowel sounds are normal.     Palpations: Abdomen is soft. There is no mass.     Tenderness: There is no abdominal tenderness. There is no guarding or rebound.  Musculoskeletal:        General: Normal range of motion.     Cervical back: Normal range of motion and neck  supple.  Lymphadenopathy:     Cervical: No cervical adenopathy.     Upper Body:     Right upper body: No supraclavicular, axillary or pectoral adenopathy.     Left upper body: No supraclavicular, axillary or pectoral adenopathy.  Skin:    General: Skin is warm and dry.  Neurological:     General: No focal deficit present.     Mental Status: She is alert and oriented to person, place, and time. Mental status is at baseline.  Psychiatric:        Mood and Affect: Mood normal.        Behavior: Behavior normal.        Thought Content: Thought content normal.        Judgment: Judgment normal.      Assessment & Plan:  Encounter for screening mammogram for malignant neoplasm of breast -     3D Screening Mammogram, Left and Right; Future  History of colonic polyps  Serrated adenoma of colon -     Ambulatory referral to Gastroenterology  Prediabetes -     Hemoglobin A1c  Drug-induced vitamin B12 deficiency anemia  Familial hyperlipidemia, high LDL -     Comprehensive metabolic panel -     Lipid panel -     LDL cholesterol, direct  B12 deficiency -     Vitamin B12  Chronic gastritis without bleeding, unspecified gastritis type Assessment & Plan: Chronic,  by EGD Oct 2032  controlled with daily use of protonix    Osteopenia of lumbar spine Assessment & Plan: Bone Density scores received, she has osteopenia,  Mild. Will repeat in 5 years and consider therapy then if there is a significant change. Continue calcium, vitamin d and weight bearing exercise on a regular basis.   Orders: -     DG Bone Density; Future  Need for influenza vaccination -     Flu vaccine trivalent PF, 6mos and older(Flulaval,Afluria,Fluarix,Fluzone)  Encounter for preventive health examination Assessment & Plan: age appropriate education and counseling updated, referrals for preventative services and immunizations addressed, dietary and smoking counseling addressed, most recent labs reviewed.  I  have personally reviewed and have noted:   1) the patient's medical and social history 2) The pt's use of alcohol, tobacco, and illicit drugs 3) The patient's current medications and supplements 4) Functional ability including ADL's, fall risk, home safety risk, hearing and visual impairment 5) Diet and physical activities 6) Evidence for depression or mood disorder 7) The patient's height, weight, and BMI have been recorded in the chart   I have made referrals, and provided counseling and education based on review of the above  Follow-up: Return in about 6 months (around 07/21/2023).   Sherlene Shams, MD

## 2023-01-21 NOTE — Assessment & Plan Note (Signed)
Bone Density scores received, she has osteopenia,  Mild. Will repeat in 5 years and consider therapy then if there is a significant change. Continue calcium, vitamin d and weight bearing exercise on a regular basis.

## 2023-01-21 NOTE — Assessment & Plan Note (Signed)
Chronic,  by EGD Oct 2032  controlled with daily use of protonix

## 2023-01-21 NOTE — Patient Instructions (Addendum)
THANK YOU FOR THE CHRISTMAS TOWEL!  IT'S BEAUTIFUL  You will need another colonoscopy in Feb 2025 BECAUSE Dr Lemar Livings removed  large polyp,  so I will  make a referral to [facility]

## 2023-01-23 NOTE — Assessment & Plan Note (Signed)

## 2023-02-07 DIAGNOSIS — Z1231 Encounter for screening mammogram for malignant neoplasm of breast: Secondary | ICD-10-CM | POA: Diagnosis not present

## 2023-02-07 LAB — HM MAMMOGRAPHY

## 2023-02-08 ENCOUNTER — Encounter: Payer: Self-pay | Admitting: Internal Medicine

## 2023-04-06 ENCOUNTER — Ambulatory Visit: Payer: Medicare PPO | Admitting: *Deleted

## 2023-04-06 VITALS — Ht 60.0 in | Wt 120.0 lb

## 2023-04-06 DIAGNOSIS — Z0000 Encounter for general adult medical examination without abnormal findings: Secondary | ICD-10-CM

## 2023-04-06 NOTE — Progress Notes (Addendum)
Subjective:   Sharon Webster is a 78 y.o. female who presents for Medicare Annual (Subsequent) preventive examination.  Visit Complete: Virtual I connected with  Lauris Chroman on 04/06/23 by a audio enabled telemedicine application and verified that I am speaking with the correct person using two identifiers.This patient declined Interactive audio and Acupuncturist. Therefore the visit was completed with audio only.   Patient Location: Home  Provider Location: Home Office  I discussed the limitations of evaluation and management by telemedicine. The patient expressed understanding and agreed to proceed.  Vital Signs: Because this visit was a virtual/telehealth visit, some criteria may be missing or patient reported. Any vitals not documented were not able to be obtained and vitals that have been documented are patient reported.    Cardiac Risk Factors include: advanced age (>67men, >59 women);dyslipidemia;hypertension     Objective:    Today's Vitals   04/06/23 0814  Weight: 120 lb (54.4 kg)  Height: 5' (1.524 m)   Body mass index is 23.44 kg/m.     04/06/2023    8:30 AM 03/05/2022   11:58 AM 12/30/2021    6:42 AM 11/11/2021    3:09 PM 10/27/2021    5:35 PM 03/04/2021   11:26 AM 05/07/2020    9:30 AM  Advanced Directives  Does Patient Have a Medical Advance Directive? No No No No No No No  Would patient like information on creating a medical advance directive? No - Patient declined No - Patient declined    No - Patient declined No - Patient declined    Current Medications (verified) Outpatient Encounter Medications as of 04/06/2023  Medication Sig   amLODipine (NORVASC) 5 MG tablet TAKE 1 TABLET(5 MG) BY MOUTH DAILY   atorvastatin (LIPITOR) 20 MG tablet TAKE 1 TABLET(20 MG) BY MOUTH DAILY   cholecalciferol (VITAMIN D3) 25 MCG (1000 UT) tablet Take 1,000 Units by mouth daily.   hydrochlorothiazide (MICROZIDE) 12.5 MG capsule TAKE 1 CAPSULE(12.5 MG) BY  MOUTH DAILY IN THE MORNING   pantoprazole (PROTONIX) 40 MG tablet TAKE 1 TABLET(40 MG) BY MOUTH DAILY   polyethylene glycol (MIRALAX / GLYCOLAX) 17 g packet Take 17 g by mouth daily. Take 1/2 teaspoon every night   vitamin B-12 (CYANOCOBALAMIN) 1000 MCG tablet Take 1,000 mcg by mouth daily.   vitamin E 1000 UNIT capsule Take 1,000 Units by mouth daily.   No facility-administered encounter medications on file as of 04/06/2023.    Allergies (verified) Patient has no known allergies.   History: Past Medical History:  Diagnosis Date   Bowel trouble    Breast screening, unspecified    atypical ductal hyperplasia, declined excision.   Colon polyp 03/15/2009   COPD (chronic obstructive pulmonary disease) (HCC)    Diverticulosis    Duodenitis    GERD (gastroesophageal reflux disease)    Glaucoma    Lump or mass in breast    Pap smear abnormality of cervix/human papillomavirus (HPV) positive 02/02/2015   Personal history of tobacco use, presenting hazards to health    Special screening for malignant neoplasms, colon    Past Surgical History:  Procedure Laterality Date   BREAST BIOPSY Right 12/14/2011   3:00, Finesse biopsy,December 14, 2011. This was completed as her FNA showed atypia, papilloma, "features" of  ADH  identified. Declined formal excision. I elected not to offer chemoprevention without formal excision. .(January 13, 2012.)    COLONOSCOPY  08/20/2009   Tubular adenoma x2. Transverse and descending colon Dr. Niel Hummer   COLONOSCOPY  WITH PROPOFOL N/A 04/02/2015   Tubular adenoma. F/U 2022.  Surgeon: [name], MD;  Location: [facility] ENDOSCOPY;  Service: Endoscopy;  Laterality: N/A;   COLONOSCOPY WITH PROPOFOL N/A 05/07/2020   Procedure: COLONOSCOPY WITH PROPOFOL;  Surgeon: [name], MD;  Location: [facility] ENDOSCOPY;  Service: Endoscopy;  Laterality: N/A;   ESOPHAGOGASTRODUODENOSCOPY (EGD) WITH PROPOFOL N/A 12/30/2021   Procedure: ESOPHAGOGASTRODUODENOSCOPY (EGD) WITH  PROPOFOL;  Surgeon: [name], MD;  Location: [facility] ENDOSCOPY;  Service: Endoscopy;  Laterality: N/A;   Family History  Problem Relation Age of Onset   Heart disease Mother    COPD Mother    Hypertension Mother    Cancer Father        metastatic prostate CA   Stroke Sister        hemorrhagic CVA   Heart disease Sister        restrictive cardiomyopathy   Aneurysm Brother    Social History   Socioeconomic History   Marital status: Widowed    Spouse name: Not on file   Number of children: Not on file   Years of education: Not on file   Highest education level: Not on file  Occupational History   Not on file  Tobacco Use   Smoking status: Former    Current packs/day: 0.00    Average packs/day: 1 pack/day for 20.0 years (20.0 ttl pk-yrs)    Types: Cigarettes    Start date: 05/15/1981    Quit date: 05/15/2001    Years since quitting: 21.9   Smokeless tobacco: Never  Vaping Use   Vaping status: Never Used  Substance and Sexual Activity   Alcohol use: Not Currently    Alcohol/week: 3.0 standard drinks of alcohol    Types: 3 Cans of beer per week   Drug use: No   Sexual activity: Not Currently  Other Topics Concern   Not on file  Social History Narrative   Not on file   Social Drivers of Health   Financial Resource Strain: Low Risk  (04/06/2023)   Overall Financial Resource Strain (CARDIA)    Difficulty of Paying Living Expenses: Not hard at all  Food Insecurity: No Food Insecurity (04/06/2023)   Hunger Vital Sign    Worried About Running Out of Food in the Last Year: Never true    Ran Out of Food in the Last Year: Never true  Transportation Needs: No Transportation Needs (04/06/2023)   PRAPARE - Administrator, Civil Service (Medical): No    Lack of Transportation (Non-Medical): No  Physical Activity: Inactive (04/06/2023)   Exercise Vital Sign    Days of Exercise per Week: 0 days    Minutes of Exercise per Session: 0 min  Stress: No Stress Concern  Present (04/06/2023)   [employer] of Occupational Health - Occupational Stress Questionnaire    Feeling of Stress : Only a little  Social Connections: Socially Isolated (04/06/2023)   Social Connection and Isolation Panel [NHANES]    Frequency of Communication with Friends and Family: More than three times a week    Frequency of Social Gatherings with Friends and Family: More than three times a week    Attends Religious Services: Never    Database administrator or Organizations: No    Attends Banker Meetings: Never    Marital Status: Widowed    Tobacco Counseling Counseling given: Not Answered   Clinical Intake:  Pre-visit preparation completed: Yes  Pain : No/denies pain  BMI - recorded: 23.44 Nutritional Status: BMI of 19-24  Normal Nutritional Risks: None Diabetes: No  How often do you need to have someone help you when you read instructions, pamphlets, or other written materials from your doctor or pharmacy?: 1 - Never  Interpreter Needed?: No  Information entered by :: R. Neima Lacross LPN   Activities of Daily Living    04/06/2023    8:16 AM  In your present state of health, do you have any difficulty performing the following activities:  Hearing? 0  Vision? 0  Difficulty concentrating or making decisions? 1  Walking or climbing stairs? 0  Dressing or bathing? 0  Doing errands, shopping? 0  Preparing Food and eating ? N  Using the Toilet? N  In the past six months, have you accidently leaked urine? N  Do you have problems with loss of bowel control? N  Managing your Medications? N  Managing your Finances? N  Housekeeping or managing your Housekeeping? N    Patient Care Team: Sherlene Shams, MD as PCP - General (Internal Medicine) Lemar Livings Merrily Pew, MD (General Surgery) Sherlene Shams, MD (Internal Medicine)  Indicate any recent Medical Services you may have received from other than [facility] providers in the past year (date may be  approximate).     Assessment:   This is a routine wellness examination for Tony.  Hearing/Vision screen Hearing Screening - Comments:: No issues Vision Screening - Comments:: glasses   Goals Addressed             This Visit's Progress    Patient Stated       Wants to join a club to do water aerobics       Depression Screen    04/06/2023    8:21 AM 01/21/2023    8:55 AM 07/21/2022    9:14 AM 03/05/2022   11:58 AM 01/20/2022    2:45 PM 11/18/2021   11:14 AM 11/04/2021   10:58 AM  PHQ 2/9 Scores  PHQ - 2 Score 0 0 0 0 1 1 1   PHQ- 9 Score 3          Fall Risk    04/06/2023    8:17 AM 01/21/2023    8:54 AM 07/21/2022    9:14 AM 03/05/2022   11:59 AM 01/20/2022    2:45 PM  Fall Risk   Falls in the past year? 0 1 0 0 0  Number falls in past yr: 0 0 0 0   Injury with Fall? 0 0 0 0   Risk for fall due to : No Fall Risks History of fall(s) No Fall Risks No Fall Risks No Fall Risks  Follow up Falls prevention discussed;Falls evaluation completed Falls evaluation completed Falls evaluation completed Falls evaluation completed;Falls prevention discussed Falls evaluation completed    MEDICARE RISK AT HOME: Medicare Risk at Home Any stairs in or around the home?: Yes If so, are there any without handrails?: No Home free of loose throw rugs in walkways, pet beds, electrical cords, etc?: Yes Adequate lighting in your home to reduce risk of falls?: Yes Life alert?: No Use of a cane, walker or w/c?: No Grab bars in the bathroom?: Yes Shower chair or bench in shower?: Yes Elevated toilet seat or a handicapped toilet?: Yes   Cognitive Function:    02/17/2017   11:46 AM 02/18/2016   11:41 AM 02/18/2015   10:35 AM  MMSE - Mini Mental State Exam  Orientation to time 5 5  5  Orientation to Place 5 5 5   Registration 3 3 3   Attention/ Calculation 5 5 5   Recall 3 3 3   Language- name 2 objects 2 2 2   Language- repeat 1 1 1   Language- follow 3 step command 3 3 3   Language- read  & follow direction 1 1 1   Write a sentence 1 1 1   Copy design 1 1 1   Total score 30 30 30         04/06/2023    8:30 AM 03/05/2022   12:01 PM 03/03/2020   11:56 AM 02/23/2019    9:19 AM 02/21/2018   11:01 AM  6CIT Screen  What Year? 0 points 0 points 0 points 0 points 0 points  What month? 0 points 0 points 0 points 0 points 0 points  What time? 0 points 0 points 0 points 0 points 0 points  Count back from 20 0 points 0 points 0 points 0 points 0 points  Months in reverse 4 points 0 points 0 points 0 points 0 points  Repeat phrase 0 points 0 points 0 points 0 points 0 points  Total Score 4 points 0 points 0 points 0 points 0 points    Immunizations Immunization History  Administered Date(s) Administered   Hep A / Hep B 07/31/2014, 09/03/2014, 02/04/2015   Influenza Split 02/04/2014, 01/31/2015   Influenza, High Dose Seasonal PF 01/08/2016   Influenza, Seasonal, Injecte, Preservative Fre 01/21/2023   Influenza,inj,Quad PF,6+ Mos 01/31/2015, 12/13/2016, 02/21/2018, 12/12/2018   Influenza-Unspecified 02/12/2013, 12/30/2016   PFIZER(Purple Top)SARS-COV-2 Vaccination 05/10/2019, 05/31/2019, 03/20/2020   Pneumococcal Conjugate-13 01/31/2015   Tdap 01/06/2010    TDAP status: Due, Education has been provided regarding the importance of this vaccine. Advised may receive this vaccine at local pharmacy or Health Dept. Aware to provide a copy of the vaccination record if obtained from local pharmacy or Health Dept. Verbalized acceptance and understanding.  Flu Vaccine status: Up to date  Pneumococcal vaccine status: Due, Education has been provided regarding the importance of this vaccine. Advised may receive this vaccine at local pharmacy or Health Dept. Aware to provide a copy of the vaccination record if obtained from local pharmacy or Health Dept. Verbalized acceptance and understanding.  Covid-19 vaccine status: Information provided on how to obtain vaccines.   Qualifies for  Shingles Vaccine? Yes   Zostavax completed No   Shingrix Completed?: No.    Education has been provided regarding the importance of this vaccine. Patient has been advised to call insurance company to determine out of pocket expense if they have not yet received this vaccine. Advised may also receive vaccine at local pharmacy or Health Dept. Verbalized acceptance and understanding.  Screening Tests Health Maintenance  Topic Date Due   Zoster Vaccines- Shingrix (1 of 2) Never done   Pneumonia Vaccine 62+ Years old (2 of 2 - PPSV23 or PCV20) 03/28/2015   DTaP/Tdap/Td (2 - Td or Tdap) 01/07/2020   COVID-19 Vaccine (4 - 2024-25 season) 11/14/2022   Medicare Annual Wellness (AWV)  03/06/2023   Colonoscopy  05/08/2023   MAMMOGRAM  02/07/2024   INFLUENZA VACCINE  Completed   DEXA SCAN  Completed   Hepatitis C Screening  Completed   HPV VACCINES  Aged Out    Health Maintenance  Health Maintenance Due  Topic Date Due   Zoster Vaccines- Shingrix (1 of 2) Never done   Pneumonia Vaccine 80+ Years old (2 of 2 - PPSV23 or PCV20) 03/28/2015   DTaP/Tdap/Td (2 -  Td or Tdap) 01/07/2020   COVID-19 Vaccine (4 - 2024-25 season) 11/14/2022   Medicare Annual Wellness (AWV)  03/06/2023   Colonoscopy  05/08/2023    Colorectal cancer screening: Type of screening: Colonoscopy. Completed 04/2020. Repeat every 3 years Has appointment scheduled  Mammogram status: Completed 01/2023. Repeat every year  Bone Density status: Completed 2/20188. Results reflect: Bone density results: OSTEOPENIA. Repeat every 2 years. Order was placed 01/2023. Patient will call and schedule an appointment.  Lung Cancer Screening: (Low Dose CT Chest recommended if Age 103-80 years, 20 pack-year currently smoking OR have quit w/in 15years.) does not qualify.     Additional Screening:  Hepatitis C Screening: does qualify; Completed 01/2015  Vision Screening: Recommended annual ophthalmology exams for early detection of glaucoma  and other disorders of the eye. Is the patient up to date with their annual eye exam?  Yes  Who is the provider or what is the name of the office in which the patient attends annual eye exams? [facility]. [facility] If pt is not established with a provider, would they like to be referred to a provider to establish care? No .   Dental Screening: Recommended annual dental exams for proper oral hygiene    Community Resource Referral / Chronic Care Management: CRR required this visit?  No   CCM required this visit?  No     Plan:     I have personally reviewed and noted the following in the patient's chart:   Medical and social history Use of alcohol, tobacco or illicit drugs  Current medications and supplements including opioid prescriptions. Patient is not currently taking opioid prescriptions. Functional ability and status Nutritional status Physical activity Advanced directives List of other physicians Hospitalizations, surgeries, and ER visits in previous 12 months Vitals Screenings to include cognitive, depression, and falls Referrals and appointments  In addition, I have reviewed and discussed with patient certain preventive protocols, quality metrics, and best practice recommendations. A written personalized care plan for preventive services as well as general preventive health recommendations were provided to patient.     Sydell Axon, LPN   11/11/5619   After Visit Summary: (Pick Up) Due to this being a telephonic visit, with patients personalized plan was offered to patient and patient has requested to Pick up at office.  Nurse Notes: None   I have reviewed the above information and agree with above.   Duncan Dull, MD

## 2023-04-06 NOTE — Patient Instructions (Signed)
Sharon Webster , Thank you for taking time to come for your Medicare Wellness Visit. I appreciate your ongoing commitment to your health goals. Please review the following plan we discussed and let me know if I can assist you in the future.   Referrals/Orders/Follow-Ups/Clinician Recommendations: Consider updating your vaccines.   This is a list of the screening recommended for you and due dates:  Health Maintenance  Topic Date Due   Zoster (Shingles) Vaccine (1 of 2) Never done   Pneumonia Vaccine (2 of 2 - PPSV23 or PCV20) 03/28/2015   DTaP/Tdap/Td vaccine (2 - Td or Tdap) 01/07/2020   COVID-19 Vaccine (4 - 2024-25 season) 11/14/2022   Colon Cancer Screening  05/08/2023   Mammogram  02/07/2024   Medicare Annual Wellness Visit  04/05/2024   Flu Shot  Completed   DEXA scan (bone density measurement)  Completed   Hepatitis C Screening  Completed   HPV Vaccine  Aged Out    Advanced directives: (Declined) Advance directive discussed with you today. Even though you declined this today, please call our office should you change your mind, and we can give you the proper paperwork for you to fill out.  Next Medicare Annual Wellness Visit scheduled for next year: Yes 04/10/24 @ 10:50

## 2023-04-11 NOTE — Progress Notes (Unsigned)
Celso Amy, PA-C [address]  [address]  [place], [place] [address]  Main: [phone]  Fax: [phone]   Gastroenterology Consultation  Referring Provider:     Sherlene Shams, MD Primary Care Physician:  Sherlene Shams, MD Primary Gastroenterologist:  Celso Amy, PA-C / Dr. Lannette Donath   Reason for Consultation:     RLQ pain, history of colon polyps        HPI:   Sharon Webster is a 78 y.o. y/o female referred for consultation & management  by Sherlene Shams, MD. referred to evaluate chronic RLQ pain and discuss repeat colonoscopy.  Has history of adenomatous colon polyps and is due for 3-year repeat colonoscopy.  Patient states she has had chronic intermittent right lower quadrant pain for many years.  Worse at nighttime.  Abdominal cramping is relieved after bowel movement.  Has history of chronic constipation.  Has been on MiraLAX half teaspoon twice daily for many years with benefit.  Constipation is under control.  Patient has moderate belching and flatulence.  Takes dicyclomine as needed for abdominal cramping which helps.  Is on pantoprazole 40 mg daily with good control of acid reflux.  Denies dysphagia or breakthrough heartburn.  Denies weight loss, rectal bleeding, or alarm symptoms.  01/2023 labs: Normal CMP and LFTs. 07/2022 lab: Normal CBC, hemoglobin 12.7.  12/2021 EGD by Dr. Lemar Livings: Widely patent Schatzki's ring at the GE junction.  A few 5 mm sessile gastric polyps.  Mild chronic gastritis.  Normal duodenum.  Biopsies negative for H. pylori.  Benign fundic gland gastric polyp.  04/2020 colonoscopy by Dr. Lemar Livings: 12 mm sessile serrated polyp removed from ascending colon.  No dysplasia.  Diverticulosis.  Excellent prep.  3-year repeat will be due 04/2023.  03/2015 colonoscopy: 5 mm polyp removed from cecum.  Sigmoid diverticulosis.  Excellent prep.  01/2022 abdominal MRI with and without contrast: Multiple benign kidney cysts.  No follow-up  recommended.  No liver lesions.  No gallstones.  CBD 4 mm.  Normal pancreas.  11/2021 RUQ ultrasound: Mild hepatic steatosis.  Normal gallbladder with no gallstones.  Normal bile duct 2.3 mm.  10/2021 CT abdomen and pelvis with contrast: (To evaluate RLQ pain, nausea, vomiting, diarrhea): No acute abnormality.  Possible 8 mm stone in the gallbladder neck.  2 indeterminate bilateral renal lesions.    10/2021 abdominal x-ray: Normal.  Past Medical History:  Diagnosis Date   Bowel trouble    Breast screening, unspecified    atypical ductal hyperplasia, declined excision.   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 This was completed as her FNA showed atypia, papilloma, "features" of  ADH  identified. Declined formal excision. I elected not to offer chemoprevention without formal excision. .(January 13, 2012.)    COLONOSCOPY  08/20/2009   Tubular adenoma x2. Transverse and descending colon Dr. Niel Hummer   COLONOSCOPY WITH PROPOFOL N/A 04/02/2015   Tubular adenoma. F/U 2022.  Surgeon: [name], MD;  Location: [facility] ENDOSCOPY;  Service: Endoscopy;  Laterality: N/A;   COLONOSCOPY WITH PROPOFOL N/A 05/07/2020   Procedure: COLONOSCOPY WITH PROPOFOL;  Surgeon: [name], MD;  Location: [facility] ENDOSCOPY;  Service: Endoscopy;  Laterality: N/A;   ESOPHAGOGASTRODUODENOSCOPY (EGD) WITH PROPOFOL N/A 12/30/2021   Procedure: ESOPHAGOGASTRODUODENOSCOPY (EGD) WITH PROPOFOL;   Surgeon: [name], MD;  Location: [facility] ENDOSCOPY;  Service: Endoscopy;  Laterality: N/A;    Prior to Admission medications   Medication Sig Start Date End Date Taking? Authorizing Provider  amLODipine (NORVASC) 5 MG tablet TAKE 1 TABLET(5 MG) BY MOUTH DAILY 05/19/22   Sherlene Shams, MD  atorvastatin (LIPITOR) 20 MG tablet TAKE 1 TABLET(20 MG) BY MOUTH DAILY 01/11/23   Sherlene Shams, MD  cholecalciferol (VITAMIN D3) 25 MCG (1000 UT) tablet Take 1,000 Units by mouth daily.    [provider]  hydrochlorothiazide (MICROZIDE) 12.5 MG capsule TAKE 1 CAPSULE(12.5 MG) BY MOUTH DAILY IN THE MORNING 01/11/23   Sherlene Shams, MD  pantoprazole (PROTONIX) 40 MG tablet TAKE 1 TABLET(40 MG) BY MOUTH DAILY 10/19/22   Sherlene Shams, MD  polyethylene glycol (MIRALAX / GLYCOLAX) 17 g packet Take 17 g by mouth daily. Take 1/2 teaspoon every night    [provider]  vitamin B-12 (CYANOCOBALAMIN) 1000 MCG tablet Take 1,000 mcg by mouth daily.    [provider]  vitamin E 1000 UNIT capsule Take 1,000 Units by mouth daily.    [provider]    Family History  Problem Relation Age of Onset   Heart disease Mother    COPD Mother    Hypertension Mother    Cancer Father        metastatic prostate CA   Stroke Sister        hemorrhagic CVA   Heart disease Sister        restrictive cardiomyopathy   Aneurysm Brother      Social History   Tobacco Use   Smoking status: Former    Current packs/day: 0.00    Average packs/day: 1 pack/day for 20.0 years (20.0 ttl pk-yrs)    Types: Cigarettes    Start date: 05/15/1981    Quit date: 05/15/2001    Years since quitting: 21.9   Smokeless tobacco: Never  Vaping Use   Vaping status: Never Used  Substance Use Topics   Alcohol use: Not Currently    Alcohol/week: 3.0 standard drinks of alcohol    Types: 3 Cans of beer per week   Drug use: No    Allergies as of 04/12/2023   (No Known Allergies)    Review of  Systems:    All systems reviewed and negative except where noted in HPI.   Physical Exam:  BP 134/63   Pulse 71   Temp 98.3 F (36.8 C)   Ht 5' (1.524 m)   Wt 123 lb 12.8 oz (56.2 kg)   BMI 24.18 kg/m  No LMP recorded. Patient is postmenopausal.  General:   Alert,  Well-developed, well-nourished, pleasant and cooperative in NAD Lungs:  Respirations even and unlabored.  Clear throughout to auscultation.   No wheezes, crackles, or rhonchi. No acute distress. Heart:  Regular rate and rhythm; no murmurs, clicks, rubs, or gallops. Abdomen:  Normal bowel sounds.  No bruits.  Soft, and non-distended without masses, hepatosplenomegaly or hernias noted.  No Tenderness.  No guarding or rebound tenderness.    Neurologic:  Alert and oriented x3;  grossly normal neurologically. Psych:  Alert and cooperative. Normal mood and affect.  Imaging Studies: No results found.  Assessment and Plan:   Sharon Webster is a 78 y.o. y/o female has been referred for:  Chronic intermittent right side and RLQ abdominal cramping.  Most likely due to IBS and colon spasm.  No tenderness on exam today.  Previous CT and labs unrevealing.  Continue dicyclomine as needed.  Discussed adverse side effects.  Gas / Bloating / Belching Pt. Education given from [employer]. Avoid Gas Producing foods. Avoid Sodas. Try OTC Gas-X Consider trial of Xifaxan (for SIBO or IBS) in the future if symptoms persist.  IBS-C Continue Miralax 1/2 tsp BID.  GERD - Controlled on PPI  Continue Pantoprazole 40mg  daily  Recommend Lifestyle Modifications to prevent Acid Reflux.  Rec. Avoid coffee, sodas, peppermint, garlic, onions, alcohol, citrus fruits, chocolate, tomatoes, fatty and spicey foods.  Avoid eating 2-3 hours before bedtime.    Hx Colon Polyps  Scheduling Colonoscopy I discussed risks of colonoscopy with patient to include risk of bleeding, colon perforation, and risk of sedation.  Patient expressed understanding and  agrees to proceed with colonoscopy.   Follow up As needed based on Colonoscopy results and GI symptoms.  Celso Amy, PA-C

## 2023-04-12 ENCOUNTER — Ambulatory Visit: Payer: Medicare PPO | Admitting: Physician Assistant

## 2023-04-12 ENCOUNTER — Encounter: Payer: Self-pay | Admitting: Physician Assistant

## 2023-04-12 VITALS — BP 134/63 | HR 71 | Temp 98.3°F | Ht 60.0 in | Wt 123.8 lb

## 2023-04-12 DIAGNOSIS — K219 Gastro-esophageal reflux disease without esophagitis: Secondary | ICD-10-CM

## 2023-04-12 DIAGNOSIS — K580 Irritable bowel syndrome with diarrhea: Secondary | ICD-10-CM | POA: Diagnosis not present

## 2023-04-12 DIAGNOSIS — Z860100 Personal history of colon polyps, unspecified: Secondary | ICD-10-CM

## 2023-04-12 DIAGNOSIS — R1031 Right lower quadrant pain: Secondary | ICD-10-CM | POA: Diagnosis not present

## 2023-04-12 DIAGNOSIS — R140 Abdominal distension (gaseous): Secondary | ICD-10-CM | POA: Diagnosis not present

## 2023-04-12 DIAGNOSIS — R143 Flatulence: Secondary | ICD-10-CM | POA: Diagnosis not present

## 2023-04-12 MED ORDER — PEG 3350-KCL-NA BICARB-NACL 420 G PO SOLR: 4000.0000 mL | Freq: Once | ORAL | 0 refills | Status: AC

## 2023-04-12 NOTE — Patient Instructions (Signed)
Avoid Gas-Producing Foods - Handout given from [employer].  Try OTC Gas-X for Gas / Bloating.  Avoid sodas and carbonated drinks to help belching.  Nice to meet you.  Please let us know if your GI symptoms worsen.

## 2023-04-14 ENCOUNTER — Ambulatory Visit
Admission: RE | Admit: 2023-04-14 | Discharge: 2023-04-14 | Disposition: A | Discharge status: Home / Self Care | Payer: Medicare PPO | Source: Ambulatory Visit | Attending: Internal Medicine | Admitting: Internal Medicine

## 2023-04-14 DIAGNOSIS — M8588 Other specified disorders of bone density and structure, other site: Secondary | ICD-10-CM | POA: Insufficient documentation

## 2023-04-14 DIAGNOSIS — Z780 Asymptomatic menopausal state: Secondary | ICD-10-CM | POA: Diagnosis not present

## 2023-04-18 ENCOUNTER — Encounter: Payer: Self-pay | Admitting: *Deleted

## 2023-04-19 ENCOUNTER — Other Ambulatory Visit: Payer: Self-pay

## 2023-04-19 ENCOUNTER — Telehealth: Payer: Self-pay

## 2023-04-19 MED ORDER — DICYCLOMINE HCL 20 MG PO TABS: ORAL_TABLET | ORAL | 1 refills | Status: DC

## 2023-04-19 NOTE — Telephone Encounter (Signed)
Refilled 08/02/2021 Last OV: 01/21/2023 Next OV: 07/21/2023  Pt stated that she does still take this medication.

## 2023-04-19 NOTE — Addendum Note (Signed)
Addended by: Sandy Salaam on: 04/19/2023 11:20 AM   Modules accepted: Orders

## 2023-04-19 NOTE — Telephone Encounter (Signed)
 Copied from CRM [id number]. Topic: General - Other >> Apr 19, 2023 10:26 AM Sharon Webster wrote: Reason for CRM: Patient called back regarding a missed called informed her the information per telephone encounter, patient stated that she does still take the medication  Dicyclomine 

## 2023-04-19 NOTE — Telephone Encounter (Signed)
Received a refill request for Dicyclomine which is not in pt's current medication list. Need to find out if pt is still taking medication.

## 2023-04-19 NOTE — Telephone Encounter (Signed)
error 

## 2023-04-25 DIAGNOSIS — H5212 Myopia, left eye: Secondary | ICD-10-CM | POA: Diagnosis not present

## 2023-04-25 DIAGNOSIS — H5201 Hypermetropia, right eye: Secondary | ICD-10-CM | POA: Diagnosis not present

## 2023-04-25 DIAGNOSIS — H401134 Primary open-angle glaucoma, bilateral, indeterminate stage: Secondary | ICD-10-CM | POA: Diagnosis not present

## 2023-04-25 DIAGNOSIS — H52223 Regular astigmatism, bilateral: Secondary | ICD-10-CM | POA: Diagnosis not present

## 2023-05-05 ENCOUNTER — Encounter: Payer: Self-pay | Admitting: Gastroenterology

## 2023-05-12 ENCOUNTER — Encounter: Payer: Self-pay | Admitting: Gastroenterology

## 2023-05-12 ENCOUNTER — Ambulatory Visit: Payer: Medicare PPO | Admitting: Anesthesiology

## 2023-05-12 ENCOUNTER — Ambulatory Visit
Admission: RE | Admit: 2023-05-12 | Discharge: 2023-05-12 | Disposition: A | Discharge status: Home / Self Care | Payer: Medicare PPO | Source: Ambulatory Visit | Attending: Gastroenterology | Admitting: Gastroenterology

## 2023-05-12 ENCOUNTER — Encounter
Admission: RE | Disposition: A | Discharge status: Home / Self Care | Payer: Self-pay | Source: Ambulatory Visit | Attending: Gastroenterology

## 2023-05-12 DIAGNOSIS — Z87891 Personal history of nicotine dependence: Secondary | ICD-10-CM | POA: Diagnosis not present

## 2023-05-12 DIAGNOSIS — Z860100 Personal history of colon polyps, unspecified: Secondary | ICD-10-CM

## 2023-05-12 DIAGNOSIS — D124 Benign neoplasm of descending colon: Secondary | ICD-10-CM | POA: Insufficient documentation

## 2023-05-12 DIAGNOSIS — K635 Polyp of colon: Secondary | ICD-10-CM

## 2023-05-12 DIAGNOSIS — D122 Benign neoplasm of ascending colon: Secondary | ICD-10-CM | POA: Diagnosis not present

## 2023-05-12 DIAGNOSIS — Z1211 Encounter for screening for malignant neoplasm of colon: Secondary | ICD-10-CM

## 2023-05-12 DIAGNOSIS — K624 Stenosis of anus and rectum: Secondary | ICD-10-CM | POA: Diagnosis not present

## 2023-05-12 DIAGNOSIS — I10 Essential (primary) hypertension: Secondary | ICD-10-CM | POA: Diagnosis not present

## 2023-05-12 DIAGNOSIS — K219 Gastro-esophageal reflux disease without esophagitis: Secondary | ICD-10-CM | POA: Diagnosis not present

## 2023-05-12 DIAGNOSIS — K5730 Diverticulosis of large intestine without perforation or abscess without bleeding: Secondary | ICD-10-CM | POA: Diagnosis not present

## 2023-05-12 DIAGNOSIS — Z09 Encounter for follow-up examination after completed treatment for conditions other than malignant neoplasm: Secondary | ICD-10-CM | POA: Diagnosis not present

## 2023-05-12 DIAGNOSIS — J449 Chronic obstructive pulmonary disease, unspecified: Secondary | ICD-10-CM | POA: Insufficient documentation

## 2023-05-12 SURGERY — COLONOSCOPY WITH PROPOFOL
Anesthesia: General

## 2023-05-12 MED ORDER — PROPOFOL 10 MG/ML IV BOLUS
INTRAVENOUS | Status: DC | PRN
  Administered 2023-05-12: 50 mg via INTRAVENOUS
  Administered 2023-05-12: 100 ug/kg/min via INTRAVENOUS
  Administered 2023-05-12: 50 mg via INTRAVENOUS

## 2023-05-12 MED ORDER — SODIUM CHLORIDE 0.9 % IV SOLN: INTRAVENOUS | Status: DC

## 2023-05-12 MED ORDER — LIDOCAINE HCL (CARDIAC) PF 100 MG/5ML IV SOSY
PREFILLED_SYRINGE | INTRAVENOUS | Status: DC | PRN
  Administered 2023-05-12: 100 mg via INTRAVENOUS

## 2023-05-12 MED ORDER — PROPOFOL 10 MG/ML IV BOLUS
INTRAVENOUS | Status: AC
  Filled 2023-05-12: qty 40

## 2023-05-12 MED ORDER — LIDOCAINE HCL (PF) 2 % IJ SOLN
INTRAMUSCULAR | Status: AC
  Filled 2023-05-12: qty 5

## 2023-05-12 NOTE — Op Note (Signed)
 [facility] Gastroenterology Patient Name: Sharon Webster Procedure Date: 05/12/2023 10:10 AM MRN: [id number] Account #: [id number] Date of Birth: 1945-10-15 Admit Type: Outpatient Age: 78 Room: [facility] ENDO ROOM 3 Gender: Female Note Status: Finalized Instrument Name: Peds Colonoscope [id number] Procedure:             Colonoscopy Indications:           Surveillance: Personal history of adenomatous polyps                         on last colonoscopy 3 years ago, Last colonoscopy:                         February 2022 Providers:             Toney Reil MD, MD Referring MD:          Duncan Dull, MD (Referring MD) Medicines:             General Anesthesia Complications:         No immediate complications. Estimated blood loss: None. Procedure:             Pre-Anesthesia Assessment:                        - Prior to the procedure, a History and Physical was                         performed, and patient medications and allergies were                         reviewed. The patient is competent. The risks and                         benefits of the procedure and the sedation options and                         risks were discussed with the patient. All questions                         were answered and informed consent was obtained.                         Patient identification and proposed procedure were                         verified by the physician, the nurse, the                         anesthesiologist, the anesthetist and the technician                         in the pre-procedure area in the procedure room in the                         endoscopy suite. Mental Status Examination: alert and                         oriented. Airway Examination: normal oropharyngeal  airway and neck mobility. Respiratory Examination:                         clear to auscultation. CV Examination: normal.                         Prophylactic  Antibiotics: The patient does not require                         prophylactic antibiotics. Prior Anticoagulants: The                         patient has taken no anticoagulant or antiplatelet                         agents. ASA Grade Assessment: II - A patient with mild                         systemic disease. After reviewing the risks and                         benefits, the patient was deemed in satisfactory                         condition to undergo the procedure. The anesthesia                         plan was to use general anesthesia. Immediately prior                         to administration of medications, the patient was                         re-assessed for adequacy to receive sedatives. The                         heart rate, respiratory rate, oxygen saturations,                         blood pressure, adequacy of pulmonary ventilation, and                         response to care were monitored throughout the                         procedure. The physical status of the patient was                         re-assessed after the procedure.                        After obtaining informed consent, the colonoscope was                         passed under direct vision. Throughout the procedure,                         the patient's blood pressure, pulse, and oxygen  saturations were monitored continuously. The                         Colonoscope was introduced through the anus and                         advanced to the the terminal ileum, with                         identification of the appendiceal orifice and IC                         valve. The colonoscopy was performed without                         difficulty. The patient tolerated the procedure well.                         The quality of the bowel preparation was evaluated                         using the BBPS [facility] Bowel Preparation Scale) with                         scores of: Right  Colon = 3, Transverse Colon = 3 and                         Left Colon = 3 (entire mucosa seen well with no                         residual staining, small fragments of stool or opaque                         liquid). The total BBPS score equals 9. The terminal                         ileum, ileocecal valve, appendiceal orifice, and                         rectum were photographed. Findings:      The digital rectal exam findings include anal stricture. Pertinent       negatives include no palpable rectal lesions.      A 5 mm polyp was found in the ascending colon. The polyp was sessile.       The polyp was removed with a cold snare. Resection and retrieval were       complete.      Two sessile polyps were found in the descending colon and ascending       colon. The polyps were diminutive in size. These polyps were removed       with a jumbo cold forceps. Resection and retrieval were complete.       Estimated blood loss: none.      Multiple diverticula were found in the sigmoid colon.      The retroflexed view of the distal rectum and anal verge was normal and       showed no anal or rectal abnormalities. Impression:            -  Anal stricture found on digital rectal exam.                        - One 5 mm polyp in the ascending colon, removed with                         a cold snare. Resected and retrieved.                        - Two diminutive polyps in the descending colon and in                         the ascending colon, removed with a jumbo cold                         forceps. Resected and retrieved.                        - Diverticulosis in the sigmoid colon.                        - The distal rectum and anal verge are normal on                         retroflexion view. Recommendation:        - Discharge patient to home (with escort).                        - Resume previous diet today.                        - Continue present medications.                        -  Await pathology results.                        - Repeat colonoscopy in 5 to 7 years for surveillance                         based on pathology results. Procedure Code(s):     --- Professional ---                        [phone], Colonoscopy, flexible; with removal of                         tumor(s), polyp(s), or other lesion(s) by snare                         technique                        45380, 59, Colonoscopy, flexible; with biopsy, single                         or multiple Diagnosis Code(s):     --- Professional ---                        Z86.010, Personal history of colonic polyps  K62.4, Stenosis of anus and rectum                        D12.2, Benign neoplasm of ascending colon                        D12.4, Benign neoplasm of descending colon                        K57.30, Diverticulosis of large intestine without                         perforation or abscess without bleeding CPT copyright 2022 American Medical Association. All rights reserved. The codes documented in this report are preliminary and upon coder review may  be revised to meet current compliance requirements. Dr. Libby Maw Toney Reil MD, MD 05/12/2023 10:53:34 AM This report has been signed electronically. Number of Addenda: 0 Note Initiated On: 05/12/2023 10:10 AM Scope Withdrawal Time: 0 hours 15 minutes 57 seconds  Total Procedure Duration: 0 hours 19 minutes 10 seconds  Estimated Blood Loss:  Estimated blood loss: none.      [facility]

## 2023-05-12 NOTE — Transfer of Care (Signed)
 Immediate Anesthesia Transfer of Care Note  Patient: Sharon Webster  Procedure(s) Performed: COLONOSCOPY WITH PROPOFOL POLYPECTOMY  Patient Location: PACU  Anesthesia Type:MAC  Level of Consciousness: responds to stimulation  Airway & Oxygen Therapy: Patient Spontanous Breathing  Post-op Assessment: Report given to RN and Post -op Vital signs reviewed and stable  Post vital signs: stable  Last Vitals:  Vitals Value Taken Time  BP 120/61 05/12/23 1051  Temp    Pulse 84 05/12/23 1052  Resp 19 05/12/23 1052  SpO2 100 % 05/12/23 1052  Vitals shown include unfiled device data.  Last Pain:  Vitals:   05/12/23 0915  TempSrc: Temporal  PainSc: 0-No pain         Complications: No notable events documented.

## 2023-05-12 NOTE — H&P (Signed)
 Arlyss Repress, MD [address]  [address]  [place], [place] [address]  Main: [phone]  Fax: [phone] Pager: [phone]  Primary Care Physician:  Sherlene Shams, MD Primary Gastroenterologist:  Dr. Arlyss Repress  Pre-Procedure History & Physical: HPI:  Sharon Webster is a 78 y.o. female is here for an colonoscopy.   Past Medical History:  Diagnosis Date   Bowel trouble    Breast screening, unspecified    atypical ductal hyperplasia, declined excision.   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 This was completed as her FNA showed atypia, papilloma, "features" of  ADH  identified. Declined formal excision. I elected not to offer chemoprevention without formal excision. .(January 13, 2012.)    COLONOSCOPY  08/20/2009   Tubular adenoma x2. Transverse and descending colon Dr. Niel Hummer   COLONOSCOPY WITH PROPOFOL N/A 04/02/2015   Tubular adenoma. F/U 2022.  Surgeon: [name], MD;  Location[facility] ENDOSCOPY;  Service: Endoscopy;  Laterality: N/A;   COLONOSCOPY WITH PROPOFOL N/A 05/07/2020   Procedure: COLONOSCOPY WITH PROPOFOL;  Surgeon: [name], MD;  Location: [facility] ENDOSCOPY;  Service: Endoscopy;  Laterality: N/A;   ESOPHAGOGASTRODUODENOSCOPY (EGD) WITH PROPOFOL N/A 12/30/2021   Procedure: ESOPHAGOGASTRODUODENOSCOPY (EGD) WITH PROPOFOL;  Surgeon: [name], MD;  Location: [facility] ENDOSCOPY;  Service: Endoscopy;   Laterality: N/A;    Prior to Admission medications   Medication Sig Start Date End Date Taking? Authorizing Provider  amLODipine (NORVASC) 5 MG tablet TAKE 1 TABLET(5 MG) BY MOUTH DAILY 05/19/22   Sherlene Shams, MD  atorvastatin (LIPITOR) 20 MG tablet TAKE 1 TABLET(20 MG) BY MOUTH DAILY 01/11/23   Sherlene Shams, MD  cholecalciferol (VITAMIN D3) 25 MCG (1000 UT) tablet Take 1,000 Units by mouth daily.    [provider]  dicyclomine (BENTYL) 20 MG tablet TAKE 1 TABLET(20 MG) BY MOUTH THREE TIMES DAILY BEFORE MEALS 04/19/23   Sherlene Shams, MD  hydrochlorothiazide (MICROZIDE) 12.5 MG capsule TAKE 1 CAPSULE(12.5 MG) BY MOUTH DAILY IN THE MORNING 01/11/23   Sherlene Shams, MD  pantoprazole (PROTONIX) 40 MG tablet TAKE 1 TABLET(40 MG) BY MOUTH DAILY 10/19/22   Sherlene Shams, MD  polyethylene glycol (MIRALAX / GLYCOLAX) 17 g packet Take 17 g by mouth daily. Take 1/2 teaspoon every night    [provider]  vitamin B-12 (CYANOCOBALAMIN) 1000 MCG tablet Take 1,000 mcg by mouth daily.    [provider]  vitamin E 1000 UNIT capsule Take 1,000 Units by mouth daily.    [provider]    Allergies as of 04/12/2023   (No Known Allergies)    Family History  Problem Relation Age of Onset   Heart disease Mother    COPD Mother    Hypertension Mother    Cancer Father        metastatic prostate CA   Stroke Sister        hemorrhagic  CVA   Heart disease Sister        restrictive cardiomyopathy   Aneurysm Brother     Social History   Socioeconomic History   Marital status: Widowed    Spouse name: Not on file   Number of children: Not on file   Years of education: Not on file   Highest education level: Not on file  Occupational History   Not on file  Tobacco Use   Smoking status: Former    Current packs/day: 0.00    Average packs/day: 1 pack/day for 20.0 years (20.0 ttl pk-yrs)    Types: Cigarettes    Start date: 05/15/1981    Quit date: 05/15/2001     Years since quitting: 22.0   Smokeless tobacco: Never  Vaping Use   Vaping status: Never Used  Substance and Sexual Activity   Alcohol use: Not Currently    Alcohol/week: 3.0 standard drinks of alcohol    Types: 3 Cans of beer per week   Drug use: No   Sexual activity: Not Currently  Other Topics Concern   Not on file  Social History Narrative   Not on file   Social Drivers of Health   Financial Resource Strain: Low Risk  (04/06/2023)   Overall Financial Resource Strain (CARDIA)    Difficulty of Paying Living Expenses: Not hard at all  Food Insecurity: No Food Insecurity (04/06/2023)   Hunger Vital Sign    Worried About Running Out of Food in the Last Year: Never true    Ran Out of Food in the Last Year: Never true  Transportation Needs: No Transportation Needs (04/06/2023)   PRAPARE - Administrator, Civil Service (Medical): No    Lack of Transportation (Non-Medical): No  Physical Activity: Inactive (04/06/2023)   Exercise Vital Sign    Days of Exercise per Week: 0 days    Minutes of Exercise per Session: 0 min  Stress: No Stress Concern Present (04/06/2023)   [employer] of Occupational Health - Occupational Stress Questionnaire    Feeling of Stress : Only a little  Social Connections: Socially Isolated (04/06/2023)   Social Connection and Isolation Panel [NHANES]    Frequency of Communication with Friends and Family: More than three times a week    Frequency of Social Gatherings with Friends and Family: More than three times a week    Attends Religious Services: Never    Database administrator or Organizations: No    Attends Banker Meetings: Never    Marital Status: Widowed  Intimate Partner Violence: Not At Risk (04/06/2023)   Humiliation, Afraid, Rape, and Kick questionnaire    Fear of Current or Ex-Partner: No    Emotionally Abused: No    Physically Abused: No    Sexually Abused: No    Review of Systems: See HPI, otherwise negative  ROS  Physical Exam: BP (!) 172/69   Pulse 91   Temp (!) 97.2 F (36.2 C) (Temporal)   Resp 17   Ht 5\' 1"  (1.549 m)   Wt 54.9 kg   SpO2 100%   BMI 22.86 kg/m  General:   Alert,  pleasant and cooperative in NAD Head:  Normocephalic and atraumatic. Neck:  Supple; no masses or thyromegaly. Lungs:  Clear throughout to auscultation.    Heart:  Regular rate and rhythm. Abdomen:  Soft, nontender and nondistended. Normal bowel sounds, without guarding, and without rebound.   Neurologic:  Alert and  oriented x4;  grossly  normal neurologically.  Impression/Plan: Sharon Webster is here for an colonoscopy to be performed for h/o colon polyp  Risks, benefits, limitations, and alternatives regarding  colonoscopy have been reviewed with the patient.  Questions have been answered.  All parties agreeable.   Lannette Donath, MD  05/12/2023, 9:28 AM

## 2023-05-12 NOTE — Anesthesia Preprocedure Evaluation (Signed)
 Anesthesia Evaluation  Patient identified by MRN, date of birth, ID band Patient awake    Reviewed: Allergy & Precautions, NPO status , Patient's Chart, lab work & pertinent test results  Airway Mallampati: III  TM Distance: >3 FB Neck ROM: Full    Dental  (+) Partial Upper, Partial Lower   Pulmonary neg pulmonary ROS, COPD, Patient abstained from smoking., former smoker   Pulmonary exam normal breath sounds clear to auscultation       Cardiovascular Exercise Tolerance: Good hypertension, negative cardio ROS Normal cardiovascular exam Rhythm:Regular Rate:Normal     Neuro/Psych negative neurological ROS  negative psych ROS   GI/Hepatic negative GI ROS, Neg liver ROS,GERD  Medicated,,  Endo/Other  negative endocrine ROS    Renal/GU negative Renal ROS  negative genitourinary   Musculoskeletal   Abdominal Normal abdominal exam  (+)   Peds negative pediatric ROS (+)  Hematology negative hematology ROS (+)   Anesthesia Other Findings Past Medical History: No date: Bowel trouble No date: Breast screening, unspecified     Comment:  atypical ductal hyperplasia, declined excision. 03/15/2009: Colon polyp No date: COPD (chronic obstructive pulmonary disease) (HCC) No date: Diverticulosis No date: Duodenitis No date: GERD (gastroesophageal reflux disease) No date: Glaucoma 02/24/2012: Glaucoma associated with anomaly of iris No date: Lump or mass in breast 02/02/2015: Pap smear abnormality of cervix/human papillomavirus  (HPV) positive No date: Personal history of tobacco use, presenting hazards to health No date: Special screening for malignant neoplasms, colon  Past Surgical History: 12/14/2011: BREAST BIOPSY; Right     Comment:  3:00, Finesse biopsy,December 14, 2011. This was completed              as her FNA showed atypia, papilloma, "features" of  ADH                identified. Declined formal excision. I  elected not to               offer chemoprevention without formal excision. .(January 13, 2012.)  08/20/2009: COLONOSCOPY     Comment:  Tubular adenoma x2. Transverse and descending colon Dr.               Niel Hummer 04/02/2015: COLONOSCOPY WITH PROPOFOL; N/A     Comment:  Tubular adenoma. F/U 2022.  Surgeon: [name],               MD;  Location: [facility] ENDOSCOPY;  Service: Endoscopy;                Laterality: N/A; 05/07/2020: COLONOSCOPY WITH PROPOFOL; N/A     Comment:  Procedure: COLONOSCOPY WITH PROPOFOL;  Surgeon: [name], MD;  Location: [facility] ENDOSCOPY;  Service:               Endoscopy;  Laterality: N/A; 12/30/2021: ESOPHAGOGASTRODUODENOSCOPY (EGD) WITH PROPOFOL; N/A     Comment:  Procedure: ESOPHAGOGASTRODUODENOSCOPY (EGD) WITH               PROPOFOL;  Surgeon: [name], MD;  Location:               [facility] ENDOSCOPY;  Service: Endoscopy;  Laterality: N/A;  BMI    Body Mass Index: 22.86 kg/m      Reproductive/Obstetrics negative OB ROS  Anesthesia Physical Anesthesia Plan  ASA: 2  Anesthesia Plan: General   Post-op Pain Management:    Induction: Intravenous  PONV Risk Score and Plan: Propofol infusion and TIVA  Airway Management Planned: Natural Airway and Nasal Cannula  Additional Equipment:   Intra-op Plan:   Post-operative Plan:   Informed Consent: I have reviewed the patients History and Physical, chart, labs and discussed the procedure including the risks, benefits and alternatives for the proposed anesthesia with the patient or authorized representative who has indicated his/her understanding and acceptance.     Dental Advisory Given  Plan Discussed with: CRNA  Anesthesia Plan Comments:        Anesthesia Quick Evaluation

## 2023-05-12 NOTE — Anesthesia Postprocedure Evaluation (Signed)
 Anesthesia Post Note  Patient: Sharon Webster  Procedure(s) Performed: COLONOSCOPY WITH PROPOFOL POLYPECTOMY  Patient location during evaluation: PACU Anesthesia Type: General Level of consciousness: awake and awake and alert Pain management: satisfactory to patient Vital Signs Assessment: post-procedure vital signs reviewed and stable Respiratory status: nonlabored ventilation Cardiovascular status: blood pressure returned to baseline Anesthetic complications: no   No notable events documented.   Last Vitals:  Vitals:   05/12/23 1110 05/12/23 1114  BP:  (!) 150/66  Pulse: 74 70  Resp: 15 20  Temp:    SpO2: 99% 100%    Last Pain:  Vitals:   05/12/23 1050  TempSrc: Temporal  PainSc:                  VAN STAVEREN,Ray Glacken

## 2023-05-13 ENCOUNTER — Encounter: Payer: Self-pay | Admitting: Gastroenterology

## 2023-05-13 LAB — SURGICAL PATHOLOGY

## 2023-05-16 ENCOUNTER — Encounter: Payer: Self-pay | Admitting: Gastroenterology

## 2023-07-11 ENCOUNTER — Other Ambulatory Visit: Payer: Self-pay | Admitting: Internal Medicine

## 2023-07-11 ENCOUNTER — Ambulatory Visit: Payer: Self-pay

## 2023-07-11 MED ORDER — ATORVASTATIN CALCIUM 20 MG PO TABS: 20.0000 mg | ORAL_TABLET | Freq: Every day | ORAL | 1 refills | Status: DC

## 2023-07-11 MED ORDER — HYDROCHLOROTHIAZIDE 12.5 MG PO CAPS: 12.5000 mg | ORAL_CAPSULE | Freq: Every day | ORAL | 1 refills | Status: DC

## 2023-07-11 MED ORDER — PANTOPRAZOLE SODIUM 40 MG PO TBEC: DELAYED_RELEASE_TABLET | ORAL | 1 refills | Status: DC

## 2023-07-11 NOTE — Telephone Encounter (Signed)
 Copied from CRM [id number]. Topic: Clinical - Medication Refill >> Jul 11, 2023 10:23 AM Crispin Dolphin wrote: Most Recent Primary Care Visit:  Provider: Felicitas Horse C  Department: [facility]  Visit Type: MEDICARE AWV, SEQUENTIAL  Date: 04/06/2023  Medication:  hydrochlorothiazide  (MICROZIDE ) 12.5 MG capsule - was a little green pill but no its a long white atorvastatin  (LIPITOR) 20 MG tablet    Has the patient contacted their pharmacy? Yes (Agent: If no, request that the patient contact the pharmacy for the refill. If patient does not wish to contact the pharmacy document the reason why and proceed with request.) (Agent: If yes, when and what did the pharmacy advise?) not able to fill   Is this the correct pharmacy for this prescription? Yes If no, delete pharmacy and type the correct one.  This is the patient's preferred pharmacy:  [id number] - [place], [place] - [address] AT [facility] [address] [place] [address] [place] [address] Phone: [phone] Fax: [phone]   Has the prescription been filled recently? No  Is the patient out of the medication? No  Has the patient been seen for an appointment in the last year OR does the patient have an upcoming appointment? Yes  Can we respond through MyChart? No  Agent: Please be advised that Rx refills may take up to 3 business days. We ask that you follow-up with your pharmacy.

## 2023-07-11 NOTE — Telephone Encounter (Addendum)
 Copied from CRM [id number]. Topic: Clinical - Medical Advice >> Jul 11, 2023 10:30 AM Crispin Dolphin wrote: Reason for CRM: Patient called for refill request but when making request she mentioned that her medication changed the hydrochlorothiazide  (MICROZIDE ) 12.5 MG capsule the last time she picked it up it was small and green and now its long and white. Wanted to make sure that was right. States also she spoke to her nurse that called for appt about legs falling asleep and they told her to let her provider know because it may be side affect to medication. Patient has upcoming appt. Thank You  05/02- No answer, Unable to leave vmail. Refill ordered on 4/28. Pt has f/u appt on 5/8

## 2023-07-15 NOTE — Telephone Encounter (Signed)
 Spoke with pt to find out if she reached out to the pharmacy about the hydrochlorothiazide  to see if maybe they changed manufacturers and that is why the medication is different. Pt stated that she did and that is what they told her. Pt also stated that she has stopped the cholesterol medication because it is making her muscles and joints hurt. Pt stated that she would discuss it with Dr. Madelon Scheuermann at her appt on 07/21/2023.

## 2023-07-21 ENCOUNTER — Ambulatory Visit: Payer: Medicare PPO | Admitting: Internal Medicine

## 2023-07-21 ENCOUNTER — Encounter: Payer: Self-pay | Admitting: Internal Medicine

## 2023-07-21 VITALS — BP 114/51 | HR 77 | Ht 61.0 in | Wt 121.8 lb

## 2023-07-21 DIAGNOSIS — E7849 Other hyperlipidemia: Secondary | ICD-10-CM | POA: Diagnosis not present

## 2023-07-21 DIAGNOSIS — R7303 Prediabetes: Secondary | ICD-10-CM

## 2023-07-21 DIAGNOSIS — M62838 Other muscle spasm: Secondary | ICD-10-CM

## 2023-07-21 DIAGNOSIS — R5383 Other fatigue: Secondary | ICD-10-CM

## 2023-07-21 DIAGNOSIS — J432 Centrilobular emphysema: Secondary | ICD-10-CM

## 2023-07-21 DIAGNOSIS — I10 Essential (primary) hypertension: Secondary | ICD-10-CM | POA: Diagnosis not present

## 2023-07-21 LAB — TSH: TSH: 2.12 u[IU]/mL (ref 0.35–5.50)

## 2023-07-21 LAB — LIPID PANEL
Cholesterol: 222 mg/dL — ABNORMAL HIGH (ref 0–200)
HDL: 52 mg/dL (ref >39.00)
LDL Cholesterol: 137 mg/dL — ABNORMAL HIGH (ref 0–99)
NonHDL: 169.77
Total CHOL/HDL Ratio: 4
Triglycerides: 163 mg/dL — ABNORMAL HIGH (ref 0.0–149.0)
VLDL: 32.6 mg/dL (ref 0.0–40.0)

## 2023-07-21 LAB — CK: Total CK: 52 U/L (ref 7–177)

## 2023-07-21 LAB — COMPREHENSIVE METABOLIC PANEL WITH GFR
ALT: 11 U/L (ref 0–35)
AST: 16 U/L (ref 0–37)
Albumin: 4.5 g/dL (ref 3.5–5.2)
Alkaline Phosphatase: 77 U/L (ref 39–117)
BUN: 16 mg/dL (ref 6–23)
CO2: 30 meq/L (ref 19–32)
Calcium: 9.4 mg/dL (ref 8.4–10.5)
Chloride: 101 meq/L (ref 96–112)
Creatinine, Ser: 0.82 mg/dL (ref 0.40–1.20)
GFR: 69.23 mL/min (ref >60.00)
Glucose, Bld: 138 mg/dL — ABNORMAL HIGH (ref 70–99)
Potassium: 3.6 meq/L (ref 3.5–5.1)
Sodium: 138 meq/L (ref 135–145)
Total Bilirubin: 0.4 mg/dL (ref 0.2–1.2)
Total Protein: 7.4 g/dL (ref 6.0–8.3)

## 2023-07-21 LAB — CBC WITH DIFFERENTIAL/PLATELET
Basophils Absolute: 0 10*3/uL (ref 0.0–0.1)
Basophils Relative: 0.2 % (ref 0.0–3.0)
Eosinophils Absolute: 0.1 10*3/uL (ref 0.0–0.7)
Eosinophils Relative: 0.6 % (ref 0.0–5.0)
HCT: 37.8 % (ref 36.0–46.0)
Hemoglobin: 12.5 g/dL (ref 12.0–15.0)
Lymphocytes Relative: 10.5 % — ABNORMAL LOW (ref 12.0–46.0)
Lymphs Abs: 1.1 10*3/uL (ref 0.7–4.0)
MCHC: 33.2 g/dL (ref 30.0–36.0)
MCV: 87.1 fl (ref 78.0–100.0)
Monocytes Absolute: 0.4 10*3/uL (ref 0.1–1.0)
Monocytes Relative: 4.2 % (ref 3.0–12.0)
Neutro Abs: 8.9 10*3/uL — ABNORMAL HIGH (ref 1.4–7.7)
Neutrophils Relative %: 84.5 % — ABNORMAL HIGH (ref 43.0–77.0)
Platelets: 248 10*3/uL (ref 150.0–400.0)
RBC: 4.34 Mil/uL (ref 3.87–5.11)
RDW: 12.9 % (ref 11.5–15.5)
WBC: 10.5 10*3/uL (ref 4.0–10.5)

## 2023-07-21 LAB — HEMOGLOBIN A1C: Hgb A1c MFr Bld: 6 % (ref 4.6–6.5)

## 2023-07-21 LAB — MICROALBUMIN / CREATININE URINE RATIO
Creatinine,U: 39.1 mg/dL
Microalb Creat Ratio: UNDETERMINED mg/g (ref 0.0–30.0)
Microalb, Ur: <0.7 mg/dL

## 2023-07-21 LAB — LDL CHOLESTEROL, DIRECT: Direct LDL: 147 mg/dL

## 2023-07-21 MED ORDER — MELOXICAM 15 MG PO TABS: 15.0000 mg | ORAL_TABLET | Freq: Every day | ORAL | 0 refills | Status: DC

## 2023-07-21 NOTE — Progress Notes (Signed)
 Subjective:  Patient ID: Sharon Webster, female    DOB: 10-May-1945  Age: 78 y.o. MRN: [id number]  CC: The primary encounter diagnosis was Muscle spasms of both lower extremities. Diagnoses of Essential hypertension, Familial hyperlipidemia, high LDL, Prediabetes, Other fatigue, and Centrilobular emphysema (HCC) were also pertinent to this visit.   HPI Sharon Webster presents for  Chief Complaint  Patient presents with   Medical Management of Chronic Issues    6 month follow up    1) muscle cramps and bilateral forearm pain: patient states that she was advised by her insurance RN to stop taking atorvastatin ,  which she has been taking for  8 years   statin was changed from low potency to high potency in 2023 .  She stopped the medication one week ago with no change in symptoms    2) Reviewed Feb 2025 colonoscopy report:  5 yr follow up advised in 2030 due to TA's   3) HTN:  Patient is taking amlodipine  5 mg and hctz 12.5 mg daily  as prescribed and notes no adverse effects.  Home BP readings have been done about once per week and are ranging from 114/59 to  135/66 .  She is avoiding added salt in her diet and walking regularly about 3 times per week for exercise  .   No dizziness except with prolonged work in the garden   4) has a new puppy chihauhua   now one year old   Outpatient Medications Prior to Visit  Medication Sig Dispense Refill   amLODipine  (NORVASC ) 5 MG tablet TAKE 1 TABLET(5 MG) BY MOUTH DAILY 90 tablet 1   cholecalciferol (VITAMIN D3) 25 MCG (1000 UT) tablet Take 1,000 Units by mouth daily.     dicyclomine  (BENTYL ) 20 MG tablet TAKE 1 TABLET(20 MG) BY MOUTH THREE TIMES DAILY BEFORE MEALS 270 tablet 1   hydrochlorothiazide  (MICROZIDE ) 12.5 MG capsule Take 1 capsule (12.5 mg total) by mouth daily. 90 capsule 1   pantoprazole  (PROTONIX ) 40 MG tablet TAKE 1 TABLET(40 MG) BY MOUTH DAILY 90 tablet 1   polyethylene glycol (MIRALAX  / GLYCOLAX ) 17 g packet Take 17 g by  mouth daily. Take 1/2 teaspoon every night     vitamin B-12 (CYANOCOBALAMIN ) 1000 MCG tablet Take 1,000 mcg by mouth daily.     vitamin E 1000 UNIT capsule Take 1,000 Units by mouth daily.     atorvastatin  (LIPITOR) 20 MG tablet Take 1 tablet (20 mg total) by mouth daily. (Patient not taking: Reported on 07/21/2023) 90 tablet 1   No facility-administered medications prior to visit.    Review of Systems;  Patient denies headache, fevers, malaise, unintentional weight loss, skin rash, eye pain, sinus congestion and sinus pain, sore throat, dysphagia,  hemoptysis , cough, dyspnea, wheezing, chest pain, palpitations, orthopnea, edema, abdominal pain, nausea, melena, diarrhea, constipation, flank pain, dysuria, hematuria, urinary  Frequency, nocturia, numbness, tingling, seizures,  Focal weakness, Loss of consciousness,  Tremor, insomnia, depression, anxiety, and suicidal ideation.      Objective:  BP (!) 114/51   Pulse 77   Ht 5\' 1"  (1.549 m)   Wt 121 lb 12.8 oz (55.2 kg)   SpO2 98%   BMI 23.01 kg/m   BP Readings from Last 3 Encounters:  07/21/23 (!) 114/51  05/12/23 (!) 150/66  04/12/23 134/63    Wt Readings from Last 3 Encounters:  07/21/23 121 lb 12.8 oz (55.2 kg)  05/12/23 121 lb (54.9 kg)  04/12/23 123 lb 12.8 oz (  56.2 kg)    Physical Exam Vitals reviewed.  Constitutional:      General: She is not in acute distress.    Appearance: Normal appearance. She is normal weight. She is not ill-appearing, toxic-appearing or diaphoretic.  HENT:     Head: Normocephalic.  Eyes:     General: No scleral icterus.       Right eye: No discharge.        Left eye: No discharge.     Conjunctiva/sclera: Conjunctivae normal.  Cardiovascular:     Rate and Rhythm: Normal rate and regular rhythm.     Heart sounds: Normal heart sounds.  Pulmonary:     Effort: Pulmonary effort is normal. No respiratory distress.     Breath sounds: Normal breath sounds.  Musculoskeletal:        General:  Normal range of motion.  Skin:    General: Skin is warm and dry.  Neurological:     General: No focal deficit present.     Mental Status: She is alert and oriented to person, place, and time. Mental status is at baseline.  Psychiatric:        Mood and Affect: Mood normal.        Behavior: Behavior normal.        Thought Content: Thought content normal.        Judgment: Judgment normal.    Lab Results  Component Value Date   HGBA1C 6.0 07/21/2023   HGBA1C 6.0 01/21/2023   HGBA1C 6.0 07/21/2022    Lab Results  Component Value Date   CREATININE 0.82 07/21/2023   CREATININE 0.89 01/21/2023   CREATININE 0.86 07/21/2022    Lab Results  Component Value Date   WBC 10.5 07/21/2023   HGB 12.5 07/21/2023   HCT 37.8 07/21/2023   PLT 248.0 07/21/2023   GLUCOSE 138 (H) 07/21/2023   CHOL 222 (H) 07/21/2023   TRIG 163.0 (H) 07/21/2023   HDL 52.00 07/21/2023   LDLDIRECT 147.0 07/21/2023   LDLCALC 137 (H) 07/21/2023   ALT 11 07/21/2023   AST 16 07/21/2023   NA 138 07/21/2023   K 3.6 07/21/2023   CL 101 07/21/2023   CREATININE 0.82 07/21/2023   BUN 16 07/21/2023   CO2 30 07/21/2023   TSH 2.12 07/21/2023   HGBA1C 6.0 07/21/2023   MICROALBUR <0.7 07/21/2023    No results found.  Assessment & Plan:  .Muscle spasms of both lower extremities -     CK  Essential hypertension Assessment & Plan: Well controlled on current regimen. Renal function stable, no changes today.   Lab Results  Component Value Date   CREATININE 0.82 07/21/2023   Lab Results  Component Value Date   NA 138 07/21/2023   K 3.6 07/21/2023   CL 101 07/21/2023   CO2 30 07/21/2023     Orders: -     Comprehensive metabolic panel with GFR -     Microalbumin / creatinine urine ratio  Familial hyperlipidemia, high LDL Assessment & Plan: Her cholesterol is elevated because she has suspended statin due to bilateral forearm pain that she has attributed to statin use.  CK is normal , exam is consistent  with tendonitis .  Will advise her to resume statin .  Lab Results  Component Value Date   CHOL 222 (H) 07/21/2023   HDL 52.00 07/21/2023   LDLCALC 137 (H) 07/21/2023   LDLDIRECT 147.0 07/21/2023   TRIG 163.0 (H) 07/21/2023   CHOLHDL 4 07/21/2023  Orders: -     Lipid panel -     LDL cholesterol, direct  Prediabetes Assessment & Plan: We revewed the role of diet and exercise in preventing diabetes. Advised to increase physical activity and reduce sugar in diet . Labs ordered  Lab Results  Component Value Date   HGBA1C 6.0 07/21/2023     Orders: -     Comprehensive metabolic panel with GFR -     Hemoglobin A1c  Other fatigue -     CBC with Differential/Platelet -     TSH  Centrilobular emphysema (HCC) Assessment & Plan: Asymptomatic. Quit smoking 20 year ago   Other orders -     Meloxicam ; Take 1 tablet (15 mg total) by mouth daily.  Dispense: 30 tablet; Refill: 0 -     Rosuvastatin Calcium ; Take 1 tablet (20 mg total) by mouth daily.  Dispense: 90 tablet; Refill: 0    Follow-up: Return in about 6 months (around 01/21/2024).   Thersia Flax, MD

## 2023-07-21 NOTE — Patient Instructions (Addendum)
 If your muscle enzyme is normal   we will resume cholesterol  therapy with Crestor instead of atorvastatin  and you can use the meloxicam for tendonitis caused by your yard work  If your muscle enzyme is abnormal we will NOT start any medication but I will  want you to consider starting Zetia  you may tolerate it better than the statins that you did not tolerate previously.  It works by inhibiting the absorption of cholesterol by the small intestine, so it lowers LDL . It is very well tolerated by the majority of statin intolerant patients who try it.  If you are willing to try it , let me know and I will send it to your pharmacy for a 3 month trial.    You are OVERDUE for your tetanus-diptheria-pertussis vaccine   (TDaP)   Please get this done at your pharmacy l;  it will be PAID FOR MY MEDICARE ONLY AT [facility]

## 2023-07-22 ENCOUNTER — Encounter: Payer: Self-pay | Admitting: Internal Medicine

## 2023-07-23 MED ORDER — ROSUVASTATIN CALCIUM 20 MG PO TABS: 20.0000 mg | ORAL_TABLET | Freq: Every day | ORAL | 0 refills | Status: DC

## 2023-07-23 NOTE — Assessment & Plan Note (Signed)
 We revewed the role of diet and exercise in preventing diabetes. Advised to increase physical activity and reduce sugar in diet . Labs ordered  Lab Results  Component Value Date   HGBA1C 6.0 07/21/2023

## 2023-07-23 NOTE — Assessment & Plan Note (Signed)
 Her cholesterol is elevated because she has suspended statin due to bilateral forearm pain that she has attributed to statin use.  CK is normal , exam is consistent with tendonitis .  Will advise her to resume statin .  Lab Results  Component Value Date   CHOL 222 (H) 07/21/2023   HDL 52.00 07/21/2023   LDLCALC 137 (H) 07/21/2023   LDLDIRECT 147.0 07/21/2023   TRIG 163.0 (H) 07/21/2023   CHOLHDL 4 07/21/2023

## 2023-07-23 NOTE — Assessment & Plan Note (Signed)
 Well controlled on current regimen. Renal function stable, no changes today.   Lab Results  Component Value Date   CREATININE 0.82 07/21/2023   Lab Results  Component Value Date   NA 138 07/21/2023   K 3.6 07/21/2023   CL 101 07/21/2023   CO2 30 07/21/2023

## 2023-07-23 NOTE — Assessment & Plan Note (Signed)
Asymptomatic. Quit smoking 20 year ago

## 2023-08-15 ENCOUNTER — Telehealth: Payer: Self-pay

## 2023-08-15 NOTE — Telephone Encounter (Signed)
 Copied from CRM [id number]. Topic: Clinical - Medication Question >> Aug 15, 2023 10:49 AM Chasity T wrote: Reason for CRM: Patient is calling in with questions regarding medictions that was sent to the pharmacy and wants to know more information on it. Please call her at [phone]

## 2023-08-16 NOTE — Telephone Encounter (Signed)
 Pt called and stated that she got a call from the pharmacy stating she had another cholesterol medication she needed to pick up. Pt stated that she could not remember what her and Dr. Madelon Scheuermann discussed at the last office visit. I read her AVS to her stating that she is resume cholesterol therapy with a new medication called Rosuvastatin . Pt gave a verbal understanding and stated that she would pick up the medication.

## 2023-08-18 ENCOUNTER — Other Ambulatory Visit: Payer: Self-pay | Admitting: Internal Medicine

## 2023-08-19 ENCOUNTER — Encounter: Payer: Self-pay | Admitting: Internal Medicine

## 2023-09-19 ENCOUNTER — Other Ambulatory Visit: Payer: Self-pay | Admitting: Internal Medicine

## 2023-09-19 NOTE — Telephone Encounter (Signed)
 Spoke with patient to make aware of Dr Lula recommendations. Patient says she is not needing the medication as it doesn't help her any ways. Patient says she has informed the [facility] pharmacy she doesn't need and don't know why they sent it.

## 2023-10-10 ENCOUNTER — Other Ambulatory Visit: Payer: Self-pay | Admitting: Internal Medicine

## 2023-10-11 ENCOUNTER — Other Ambulatory Visit: Payer: Self-pay | Admitting: Internal Medicine

## 2023-11-08 ENCOUNTER — Other Ambulatory Visit: Payer: Self-pay

## 2023-11-08 MED ORDER — ROSUVASTATIN CALCIUM 20 MG PO TABS: 20.0000 mg | ORAL_TABLET | Freq: Every day | ORAL | 0 refills | Status: DC

## 2024-01-17 ENCOUNTER — Other Ambulatory Visit: Payer: Self-pay

## 2024-01-17 MED ORDER — HYDROCHLOROTHIAZIDE 12.5 MG PO CAPS: ORAL_CAPSULE | ORAL | 0 refills | Status: DC

## 2024-01-25 ENCOUNTER — Ambulatory Visit: Admitting: Internal Medicine

## 2024-01-25 ENCOUNTER — Encounter: Payer: Self-pay | Admitting: Internal Medicine

## 2024-01-25 VITALS — BP 130/68 | HR 82 | Temp 98.1°F | Ht 59.0 in | Wt 119.4 lb

## 2024-01-25 DIAGNOSIS — G8929 Other chronic pain: Secondary | ICD-10-CM

## 2024-01-25 DIAGNOSIS — E7849 Other hyperlipidemia: Secondary | ICD-10-CM

## 2024-01-25 DIAGNOSIS — I10 Essential (primary) hypertension: Secondary | ICD-10-CM | POA: Diagnosis not present

## 2024-01-25 DIAGNOSIS — K760 Fatty (change of) liver, not elsewhere classified: Secondary | ICD-10-CM | POA: Diagnosis not present

## 2024-01-25 DIAGNOSIS — M25531 Pain in right wrist: Secondary | ICD-10-CM

## 2024-01-25 DIAGNOSIS — Z1231 Encounter for screening mammogram for malignant neoplasm of breast: Secondary | ICD-10-CM

## 2024-01-25 DIAGNOSIS — M25431 Effusion, right wrist: Secondary | ICD-10-CM | POA: Diagnosis not present

## 2024-01-25 LAB — CBC WITH DIFFERENTIAL/PLATELET
Basophils Absolute: 0 K/uL (ref 0.0–0.1)
Basophils Relative: 0.5 % (ref 0.0–3.0)
Eosinophils Absolute: 0 K/uL (ref 0.0–0.7)
Eosinophils Relative: 0.7 % (ref 0.0–5.0)
HCT: 37.2 % (ref 36.0–46.0)
Hemoglobin: 12.4 g/dL (ref 12.0–15.0)
Lymphocytes Relative: 25.1 % (ref 12.0–46.0)
Lymphs Abs: 1.4 K/uL (ref 0.7–4.0)
MCHC: 33.4 g/dL (ref 30.0–36.0)
MCV: 84.6 fl (ref 78.0–100.0)
Monocytes Absolute: 0.4 K/uL (ref 0.1–1.0)
Monocytes Relative: 8.2 % (ref 3.0–12.0)
Neutro Abs: 3.5 K/uL (ref 1.4–7.7)
Neutrophils Relative %: 65.5 % (ref 43.0–77.0)
Platelets: 236 K/uL (ref 150.0–400.0)
RBC: 4.4 Mil/uL (ref 3.87–5.11)
RDW: 12.9 % (ref 11.5–15.5)
WBC: 5.4 K/uL (ref 4.0–10.5)

## 2024-01-25 LAB — COMPREHENSIVE METABOLIC PANEL WITH GFR
ALT: 10 U/L (ref 0–35)
AST: 18 U/L (ref 0–37)
Albumin: 4.8 g/dL (ref 3.5–5.2)
Alkaline Phosphatase: 83 U/L (ref 39–117)
BUN: 12 mg/dL (ref 6–23)
CO2: 31 meq/L (ref 19–32)
Calcium: 9.8 mg/dL (ref 8.4–10.5)
Chloride: 99 meq/L (ref 96–112)
Creatinine, Ser: 0.88 mg/dL (ref 0.40–1.20)
GFR: 63.38 mL/min (ref >60.00)
Glucose, Bld: 93 mg/dL (ref 70–99)
Potassium: 3.8 meq/L (ref 3.5–5.1)
Sodium: 139 meq/L (ref 135–145)
Total Bilirubin: 0.7 mg/dL (ref 0.2–1.2)
Total Protein: 7.1 g/dL (ref 6.0–8.3)

## 2024-01-25 LAB — LIPID PANEL
Cholesterol: 128 mg/dL (ref 0–200)
HDL: 54.6 mg/dL (ref >39.00)
LDL Cholesterol: 54 mg/dL (ref 0–99)
NonHDL: 73.36
Total CHOL/HDL Ratio: 2
Triglycerides: 97 mg/dL (ref 0.0–149.0)
VLDL: 19.4 mg/dL (ref 0.0–40.0)

## 2024-01-25 LAB — LDL CHOLESTEROL, DIRECT: Direct LDL: 59 mg/dL

## 2024-01-25 MED ORDER — ROSUVASTATIN CALCIUM 20 MG PO TABS: 20.0000 mg | ORAL_TABLET | Freq: Every day | ORAL | 0 refills | Status: DC

## 2024-01-25 MED ORDER — AMLODIPINE BESYLATE 5 MG PO TABS: 5.0000 mg | ORAL_TABLET | Freq: Every day | ORAL | 1 refills | Status: DC

## 2024-01-25 MED ORDER — DICYCLOMINE HCL 20 MG PO TABS
ORAL_TABLET | ORAL | 1 refills | Status: AC
Start: 2024-01-25 — End: ?

## 2024-01-25 MED ORDER — DOXYCYCLINE HYCLATE 100 MG PO TABS: 100.0000 mg | ORAL_TABLET | Freq: Two times a day (BID) | ORAL | 0 refills | Status: AC

## 2024-01-25 MED ORDER — HYDROCHLOROTHIAZIDE 12.5 MG PO CAPS: ORAL_CAPSULE | ORAL | 0 refills | Status: DC

## 2024-01-25 MED ORDER — PANTOPRAZOLE SODIUM 40 MG PO TBEC: DELAYED_RELEASE_TABLET | ORAL | 1 refills | Status: DC

## 2024-01-25 NOTE — Patient Instructions (Addendum)
 Try using aspercreme on your wrist  for the pain.   Ultrasound has been  ordered to rule out a DVT .    I am putting you on an antibiotic in case there is a lingering infection that is causing the redness.  Please Take it with food to avoid stomach upset

## 2024-01-25 NOTE — Assessment & Plan Note (Signed)
 Ganglionic cyst by appearance , but need to rule out DVT .  Ultrasound ordered .  Empiric treatment for cellulitis

## 2024-01-25 NOTE — Progress Notes (Signed)
 Subjective:  Patient ID: Sharon Webster, female    DOB: 05-02-1945  Age: 78 y.o. MRN: [id number]  CC: The primary encounter diagnosis was Encounter for screening mammogram for malignant neoplasm of breast. Diagnoses of Essential hypertension, Familial hyperlipidemia, high LDL, Wrist pain, chronic, right, Wrist swelling, right, and Hepatic steatosis were also pertinent to this visit.   HPI Sharon Webster presents for  Chief Complaint  Patient presents with   Medical Management of Chronic Issues    Right hand is warm to touch swollen and sore arms still hurting as well   1) Patient is taking her medications as prescribed and notes no adverse effects.  Home BP readings have been done about once per week and are  generally < 130/80 .  She is avoiding added salt in her diet and walking regularly about 3 times per week for exercise  .   2)   right wrist focal swelling at base of thumb  tender, does a lot of yard work, and  sews for several hours daily ,  stuffing hoem made stuffed animals has been hurting for 5 months   dog scratch occurred after THE ARM started hurting and after her TDAP was boosted.  Outpatient Medications Prior to Visit  Medication Sig Dispense Refill   cholecalciferol (VITAMIN D3) 25 MCG (1000 UT) tablet Take 1,000 Units by mouth daily.     meloxicam  (MOBIC ) 15 MG tablet TAKE 1 TABLET(15 MG) BY MOUTH DAILY 30 tablet 0   polyethylene glycol (MIRALAX  / GLYCOLAX ) 17 g packet Take 17 g by mouth daily. Take 1/2 teaspoon every night     vitamin B-12 (CYANOCOBALAMIN ) 1000 MCG tablet Take 1,000 mcg by mouth daily.     vitamin E 1000 UNIT capsule Take 1,000 Units by mouth daily.     amLODipine  (NORVASC ) 5 MG tablet TAKE 1 TABLET(5 MG) BY MOUTH DAILY 90 tablet 1   dicyclomine  (BENTYL ) 20 MG tablet TAKE 1 TABLET(20 MG) BY MOUTH THREE TIMES DAILY BEFORE MEALS 270 tablet 1   hydrochlorothiazide  (MICROZIDE ) 12.5 MG capsule TAKE 1 CAPSULE(12.5 MG) BY MOUTH DAILY 90 capsule 0    pantoprazole  (PROTONIX ) 40 MG tablet TAKE 1 TABLET(40 MG) BY MOUTH DAILY 90 tablet 1   rosuvastatin  (CRESTOR ) 20 MG tablet Take 1 tablet (20 mg total) by mouth daily. 90 tablet 0   No facility-administered medications prior to visit.    Review of Systems;  Patient denies headache, fevers, malaise, unintentional weight loss, skin rash, eye pain, sinus congestion and sinus pain, sore throat, dysphagia,  hemoptysis , cough, dyspnea, wheezing, chest pain, palpitations, orthopnea, edema, abdominal pain, nausea, melena, diarrhea, constipation, flank pain, dysuria, hematuria, urinary  Frequency, nocturia, numbness, tingling, seizures,  Focal weakness, Loss of consciousness,  Tremor, insomnia, depression, anxiety, and suicidal ideation.      Objective:  BP 130/68   Pulse 82   Temp 98.1 F (36.7 C) (Oral)   Ht 4' 11 (1.499 m)   Wt 119 lb 6.4 oz (54.2 kg)   SpO2 97%   BMI 24.12 kg/m   BP Readings from Last 3 Encounters:  01/25/24 130/68  07/21/23 (!) 114/51  05/12/23 (!) 150/66    Wt Readings from Last 3 Encounters:  01/25/24 119 lb 6.4 oz (54.2 kg)  07/21/23 121 lb 12.8 oz (55.2 kg)  05/12/23 121 lb (54.9 kg)    Physical Exam Vitals reviewed.  Constitutional:      General: She is not in acute distress.    Appearance: Normal appearance.  She is normal weight. She is not ill-appearing, toxic-appearing or diaphoretic.  HENT:     Head: Normocephalic.  Eyes:     General: No scleral icterus.       Right eye: No discharge.        Left eye: No discharge.     Conjunctiva/sclera: Conjunctivae normal.  Cardiovascular:     Rate and Rhythm: Normal rate and regular rhythm.     Heart sounds: Normal heart sounds.  Pulmonary:     Effort: Pulmonary effort is normal. No respiratory distress.     Breath sounds: Normal breath sounds.  Musculoskeletal:        General: Tenderness present. Normal range of motion.     Right forearm: Swelling and tenderness present.     Left forearm: Normal.        Arms:  Skin:    General: Skin is warm and dry.  Neurological:     General: No focal deficit present.     Mental Status: She is alert and oriented to person, place, and time. Mental status is at baseline.  Psychiatric:        Mood and Affect: Mood normal.        Behavior: Behavior normal.        Thought Content: Thought content normal.        Judgment: Judgment normal.     Lab Results  Component Value Date   HGBA1C 6.0 07/21/2023   HGBA1C 6.0 01/21/2023   HGBA1C 6.0 07/21/2022    Lab Results  Component Value Date   CREATININE 0.88 01/25/2024   CREATININE 0.82 07/21/2023   CREATININE 0.89 01/21/2023    Lab Results  Component Value Date   WBC 5.4 01/25/2024   HGB 12.4 01/25/2024   HCT 37.2 01/25/2024   PLT 236.0 01/25/2024   GLUCOSE 93 01/25/2024   CHOL 128 01/25/2024   TRIG 97.0 01/25/2024   HDL 54.60 01/25/2024   LDLDIRECT 59.0 01/25/2024   LDLCALC 54 01/25/2024   ALT 10 01/25/2024   AST 18 01/25/2024   NA 139 01/25/2024   K 3.8 01/25/2024   CL 99 01/25/2024   CREATININE 0.88 01/25/2024   BUN 12 01/25/2024   CO2 31 01/25/2024   TSH 2.12 07/21/2023   HGBA1C 6.0 07/21/2023   MICROALBUR <0.7 07/21/2023    No results found.  Assessment & Plan:  .Encounter for screening mammogram for malignant neoplasm of breast -     3D Screening Mammogram, Left and Right; Future  Essential hypertension -     Comprehensive metabolic panel with GFR  Familial hyperlipidemia, high LDL -     Lipid panel -     LDL cholesterol, direct  Wrist pain, chronic, right -     US  Venous Img Upper Uni Right(DVT); Future  Wrist swelling, right Assessment & Plan: Ganglionic cyst by appearance , but need to rule out DVT .  Ultrasound ordered .  Empiric treatment for cellulitis   Orders: -     D-dimer, quantitative -     CBC with Differential/Platelet  Hepatic steatosis Assessment & Plan: Continue pravastatin  Lab Results  Component Value Date   ALT 10 01/25/2024   AST 18  01/25/2024   ALKPHOS 83 01/25/2024   BILITOT 0.7 01/25/2024      Other orders -     amLODIPine  Besylate; Take 1 tablet (5 mg total) by mouth daily.  Dispense: 90 tablet; Refill: 1 -     Dicyclomine  HCl; TAKE 1 TABLET(20 MG) BY  MOUTH THREE TIMES DAILY BEFORE MEALS  Dispense: 270 tablet; Refill: 1 -     hydroCHLOROthiazide ; TAKE 1 CAPSULE(12.5 MG) BY MOUTH DAILY  Dispense: 90 capsule; Refill: 0 -     Pantoprazole  Sodium; TAKE 1 TABLET(40 MG) BY MOUTH DAILY  Dispense: 90 tablet; Refill: 1 -     Rosuvastatin  Calcium ; Take 1 tablet (20 mg total) by mouth daily.  Dispense: 90 tablet; Refill: 0 -     Doxycycline Hyclate; Take 1 tablet (100 mg total) by mouth 2 (two) times daily. With a meal  Dispense: 14 tablet; Refill: 0     Follow-up: Return in about 6 months (around 07/24/2024) for physical, hypertension.   Verneita LITTIE Kettering, MD

## 2024-01-25 NOTE — Assessment & Plan Note (Signed)
 Continue pravastatin  Lab Results  Component Value Date   ALT 10 01/25/2024   AST 18 01/25/2024   ALKPHOS 83 01/25/2024   BILITOT 0.7 01/25/2024

## 2024-01-26 ENCOUNTER — Ambulatory Visit: Payer: Self-pay | Admitting: Internal Medicine

## 2024-01-26 LAB — D-DIMER, QUANTITATIVE: D-Dimer, Quant: 0.2 ug{FEU}/mL (ref <0.50)

## 2024-01-27 ENCOUNTER — Ambulatory Visit
Admission: RE | Admit: 2024-01-27 | Discharge: 2024-01-27 | Disposition: A | Discharge status: Home / Self Care | Source: Ambulatory Visit | Attending: Internal Medicine | Admitting: Internal Medicine

## 2024-01-27 DIAGNOSIS — G8929 Other chronic pain: Secondary | ICD-10-CM | POA: Diagnosis not present

## 2024-01-27 DIAGNOSIS — M25531 Pain in right wrist: Secondary | ICD-10-CM | POA: Insufficient documentation

## 2024-01-27 DIAGNOSIS — R600 Localized edema: Secondary | ICD-10-CM | POA: Diagnosis not present

## 2024-01-31 NOTE — Telephone Encounter (Signed)
 open in error

## 2024-03-19 DIAGNOSIS — M67431 Ganglion, right wrist: Secondary | ICD-10-CM

## 2024-03-19 NOTE — Telephone Encounter (Signed)
 Copied from CRM #[id number]. Topic: Referral - Request for Referral >> Mar 19, 2024  9:36 AM Avram MATSU wrote: Did the patient discuss referral with their provider in the last year? Yes (If No - schedule appointment) (If Yes - send message)  Appointment offered? Yes  Type of order/referral and detailed reason for visit: surgery for cyst on right wrist  Preference of office, provider, location: [facility]   If referral order, have you been seen by this specialty before? No (If Yes, this issue or another issue? When? Where?  Can we respond through MyChart? No

## 2024-03-19 NOTE — Telephone Encounter (Signed)
 Pt is requesting a referral to see a general surgeon with [facility] for the cyst on her right wrist. I have pended the referral for approval.

## 2024-03-19 NOTE — Telephone Encounter (Signed)
 Pt is aware and gave a verbal understanding.

## 2024-03-20 LAB — HM MAMMOGRAPHY

## 2024-03-23 ENCOUNTER — Ambulatory Visit (INDEPENDENT_AMBULATORY_CARE_PROVIDER_SITE_OTHER)

## 2024-03-23 ENCOUNTER — Ambulatory Visit

## 2024-03-23 VITALS — BP 152/92 | HR 72 | Ht 59.0 in | Wt 124.4 lb

## 2024-03-23 DIAGNOSIS — M25531 Pain in right wrist: Secondary | ICD-10-CM | POA: Diagnosis not present

## 2024-03-23 DIAGNOSIS — M19031 Primary osteoarthritis, right wrist: Secondary | ICD-10-CM | POA: Diagnosis not present

## 2024-03-23 DIAGNOSIS — M65331 Trigger finger, right middle finger: Secondary | ICD-10-CM | POA: Diagnosis not present

## 2024-03-23 NOTE — Progress Notes (Signed)
 "  Office Visit Note   Patient: Sharon Webster           Date of Birth: [birth year]           MRN: [id number] Visit Date: 03/23/2024              Requested by: Marylynn Verneita CROME, MD [address] [place],  [place] [address] PCP: Marylynn Verneita CROME, MD   Assessment & Plan: Visit Diagnoses:  1. Arthritis of right wrist   2. Pain in right wrist   3. Trigger finger, right middle finger     Plan: Natural history and expected course discussed. Questions answered. Patient states that her trigger finger does not bother her. Rest and ice. Reduction in offending activity discussed. OTC analgesics as needed. Thumb spica brace to be worn at night Follow up prn.  Orders:  Orders Placed This Encounter  Procedures   DG Wrist Complete Right     Subjective: Chief Complaint: Right hand pain  HPI Patient is a 79 y.o. year old female who presents with hand pain involving the right hand. Onset of the symptoms was 2 months ago. Inciting event: Patient has been doing a lot of sowing recently. Current symptoms include  pain in thumb, triggering of middle finger. Pain is located at the base of the thumb. No pain associated with the triggering. Treatment to date: none.  Objective: Vital Signs: BP (!) 152/92 (Cuff Size: Large)   Pulse 72   Ht 4' 11 (1.499 m)   Wt 124 lb 6.4 oz (56.4 kg)   BMI 25.13 kg/m   Physical Exam Gen: Alert, No Acute Distress right hand: Skin intact, no erythema or induration noted. soft tissue tenderness and swelling at the base of the right thumb around the [facility] joint, reduced range of motion of right thumb, and triggering of the middle finger. 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   PMFS History: Patient Active Problem List   Diagnosis Date Noted   Wrist swelling, right 01/25/2024   Polyp of ascending colon 05/12/2023   Polyp  of descending colon 05/12/2023   History of colon polyps 05/12/2023   Nocturia 07/21/2022   Trigger finger, right middle finger 07/21/2022   Renal cyst 01/20/2022   Bilateral renal masses 11/18/2021   Neoplasm of uncertain behavior of skin of nose 08/18/2021   Tubular adenoma of colon 04/03/2020   B12 deficiency 02/28/2019   Lactose intolerance in adult 02/26/2019   Osteopenia 10/15/2017   Gastritis 10/13/2017   Essential hypertension 04/17/2017   Constipation 04/17/2017   Drug-induced vitamin B12 deficiency anemia 04/17/2017   Prediabetes 01/10/2016   Encounter for preventive health examination 01/31/2015   History of colonic polyps 01/25/2014   History of colonic polyps 06/19/2013   Atypical ductal hyperplasia of breast 01/01/2013   Hepatic steatosis 10/24/2011   Familial hyperlipidemia, high LDL 10/17/2011   COPD (chronic obstructive pulmonary disease) (HCC)    Past Medical History:  Diagnosis Date   Bowel trouble    Breast screening, unspecified    atypical ductal hyperplasia, declined excision.   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   Lump or mass in breast    Pap smear  abnormality of cervix/human papillomavirus (HPV) positive 02/02/2015   Personal history of tobacco use, presenting hazards to health    Special screening for malignant neoplasms, colon     Family History  Problem Relation Age of Onset   Heart disease Mother    COPD Mother    Hypertension Mother    Cancer Father        metastatic prostate CA   Stroke Sister        hemorrhagic CVA   Heart disease Sister        restrictive cardiomyopathy   Aneurysm Brother     Past Surgical History:  Procedure Laterality Date   BREAST BIOPSY Right 12/14/2011   3:00, Finesse biopsy,December 14, 2011. This was completed as her FNA  showed atypia, papilloma, features of  ADH  identified. Declined formal excision. I elected not to offer chemoprevention without formal excision. .(January 13, 2012.)    COLONOSCOPY  08/20/2009   Tubular adenoma x2. Transverse and descending colon Dr. Christ   COLONOSCOPY WITH PROPOFOL  N/A 04/02/2015   Tubular adenoma. F/U 2022.  Surgeon: Reyes LELON Cota, MD;  Location: [facility] ENDOSCOPY;  Service: Endoscopy;  Laterality: N/A;   COLONOSCOPY WITH PROPOFOL  N/A 05/07/2020   Procedure: COLONOSCOPY WITH PROPOFOL ;  Surgeon: Cota Reyes LELON, MD;  Location: [facility] ENDOSCOPY;  Service: Endoscopy;  Laterality: N/A;   COLONOSCOPY WITH PROPOFOL  N/A 05/12/2023   Procedure: COLONOSCOPY WITH PROPOFOL ;  Surgeon: Unk Corinn Skiff, MD;  Location: [facility] ENDOSCOPY;  Service: Gastroenterology;  Laterality: N/A;   ESOPHAGOGASTRODUODENOSCOPY (EGD) WITH PROPOFOL  N/A 12/30/2021   Procedure: ESOPHAGOGASTRODUODENOSCOPY (EGD) WITH PROPOFOL ;  Surgeon: Cota Reyes LELON, MD;  Location: [facility] ENDOSCOPY;  Service: Endoscopy;  Laterality: N/A;   POLYPECTOMY  05/12/2023   Procedure: POLYPECTOMY;  Surgeon: Unk Corinn Skiff, MD;  Location: [facility] ENDOSCOPY;  Service: Gastroenterology;;   Social History   Occupational History   Not on file  Tobacco Use   Smoking status: Former    Current packs/day: 0.00    Average packs/day: 1 pack/day for 20.0 years (20.0 ttl pk-yrs)    Types: Cigarettes    Start date: 05/15/1981    Quit date: 05/15/2001    Years since quitting: 22.8   Smokeless tobacco: Never  Vaping Use   Vaping status: Never Used  Substance and Sexual Activity   Alcohol use: Not Currently    Alcohol/week: 3.0 standard drinks of alcohol    Types: 3 Cans of beer per week   Drug use: No   Sexual activity: Not Currently   Current Outpatient Medications  Medication Instructions   amLODipine  (NORVASC ) 5 mg, Oral, Daily   cholecalciferol (VITAMIN D3) 1,000 Units, Daily   cyanocobalamin  (VITAMIN B12) 1,000 mcg, Daily    dicyclomine  (BENTYL ) 20 MG tablet TAKE 1 TABLET(20 MG) BY MOUTH THREE TIMES DAILY BEFORE MEALS   doxycycline  (VIBRA -TABS) 100 mg, Oral, 2 times daily, With a meal   hydrochlorothiazide  (MICROZIDE ) 12.5 MG capsule TAKE 1 CAPSULE(12.5 MG) BY MOUTH DAILY   meloxicam  (MOBIC ) 15 MG tablet TAKE 1 TABLET(15 MG) BY MOUTH DAILY   pantoprazole  (PROTONIX ) 40 MG tablet TAKE 1 TABLET(40 MG) BY MOUTH DAILY   polyethylene glycol (MIRALAX  / GLYCOLAX ) 17 g, Daily   rosuvastatin  (CRESTOR ) 20 mg, Oral, Daily   vitamin E 1,000 Units, Daily   Allergies as of 03/23/2024   (No Known Allergies)   "

## 2024-04-10 ENCOUNTER — Telehealth: Payer: Self-pay | Admitting: *Deleted

## 2024-04-10 ENCOUNTER — Other Ambulatory Visit: Payer: Self-pay | Admitting: Internal Medicine

## 2024-04-10 ENCOUNTER — Other Ambulatory Visit: Payer: Self-pay

## 2024-04-10 ENCOUNTER — Ambulatory Visit: Payer: Medicare PPO | Admitting: *Deleted

## 2024-04-10 VITALS — BP 150/80 | Ht 59.0 in | Wt 120.0 lb

## 2024-04-10 DIAGNOSIS — Z0000 Encounter for general adult medical examination without abnormal findings: Secondary | ICD-10-CM

## 2024-04-10 MED ORDER — HYDROCHLOROTHIAZIDE 12.5 MG PO CAPS: ORAL_CAPSULE | ORAL | 1 refills | Status: AC

## 2024-04-10 MED ORDER — AMLODIPINE BESYLATE 2.5 MG PO TABS: 2.5000 mg | ORAL_TABLET | Freq: Every day | ORAL | 1 refills | Status: AC

## 2024-04-10 NOTE — Telephone Encounter (Signed)
 Performed AWV by telephone today.  Patient's blood pressure was 154/77 and then 150/80. Patient stated her blood pressure has been running like this recently and higher at times. Patient stated that her blood pressure medication was changed recently from Amlodipine  to Hydrochlorothiazide .

## 2024-04-10 NOTE — Patient Instructions (Addendum)
 Sharon Webster,  Thank you for taking the time for your Medicare Wellness Visit. I appreciate your continued commitment to your health goals. Please review the care plan we discussed, and feel free to reach out if I can assist you further.  Please note that Annual Wellness Visits do not include a physical exam. Some assessments may be limited, especially if the visit was conducted virtually. If needed, we may recommend an in-person follow-up with your provider.  Ongoing Care Seeing your primary care provider every 3 to 6 months helps us  monitor your health and provide consistent, personalized care.  Consider updating your covid and shingles vaccines.  Referrals If a referral was made during today's visit and you haven't received any updates within two weeks, please contact the referred provider directly to check on the status.  Recommended Screenings:  Health Maintenance  Topic Date Due   Zoster (Shingles) Vaccine (1 of 2) Never done   COVID-19 Vaccine (5 - 2025-26 season) 11/14/2023   Flu Shot  06/12/2024*   Pneumococcal Vaccine for age over 43 (2 of 2 - PPSV23, PCV20, or PCV21) 07/20/2024*   Breast Cancer Screening  03/20/2025   Medicare Annual Wellness Visit  04/10/2025   Colon Cancer Screening  05/11/2028   DTaP/Tdap/Td vaccine (2 - Td or Tdap) 08/11/2033   Osteoporosis screening with Bone Density Scan  Completed   Hepatitis C Screening  Completed   Meningitis B Vaccine  Aged Out   Hepatitis B Vaccine  Discontinued  *Topic was postponed. The date shown is not the original due date.       04/10/2024   11:06 AM  Advanced Directives  Does Patient Have a Medical Advance Directive? No  Would patient like information on creating a medical advance directive? --    Vision: Annual vision screenings are recommended for early detection of glaucoma, cataracts, and diabetic retinopathy. These exams can also reveal signs of chronic conditions such as diabetes and high blood  pressure.  Dental: Annual dental screenings help detect early signs of oral cancer, gum disease, and other conditions linked to overall health, including heart disease and diabetes.  Please see the attached documents for additional preventive care recommendations.  Sharon Webster,  Thank you for taking the time for your Medicare Wellness Visit. I appreciate your continued commitment to your health goals. Please review the care plan we discussed, and feel free to reach out if I can assist you further.  Please note that Annual Wellness Visits do not include a physical exam. Some assessments may be limited, especially if the visit was conducted virtually. If needed, we may recommend an in-person follow-up with your provider.  Ongoing Care Seeing your primary care provider every 3 to 6 months helps us  monitor your health and provide consistent, personalized care.  Consider updating your covid and shingles vaccines.   Referrals If a referral was made during today's visit and you haven't received any updates within two weeks, please contact the referred provider directly to check on the status.  Recommended Screenings:  Health Maintenance  Topic Date Due   Zoster (Shingles) Vaccine (1 of 2) Never done   COVID-19 Vaccine (5 - 2025-26 season) 11/14/2023   Flu Shot  06/12/2024*   Pneumococcal Vaccine for age over 54 (2 of 2 - PPSV23, PCV20, or PCV21) 07/20/2024*   Breast Cancer Screening  03/20/2025   Medicare Annual Wellness Visit  04/10/2025   Colon Cancer Screening  05/11/2028   DTaP/Tdap/Td vaccine (2 - Td or Tdap) 08/11/2033  Osteoporosis screening with Bone Density Scan  Completed   Hepatitis C Screening  Completed   Meningitis B Vaccine  Aged Out   Hepatitis B Vaccine  Discontinued  *Topic was postponed. The date shown is not the original due date.       04/10/2024   11:06 AM  Advanced Directives  Does Patient Have a Medical Advance Directive? No  Would patient like information  on creating a medical advance directive? --    Vision: Annual vision screenings are recommended for early detection of glaucoma, cataracts, and diabetic retinopathy. These exams can also reveal signs of chronic conditions such as diabetes and high blood pressure.  Dental: Annual dental screenings help detect early signs of oral cancer, gum disease, and other conditions linked to overall health, including heart disease and diabetes.  Please see the attached documents for additional preventive care recommendations.

## 2024-04-10 NOTE — Telephone Encounter (Signed)
 Spoke with pt to let her know that Dr. Marylynn would like for her to resume the Amlodipine  2.5 mg and continue the hydrochlorothiazide  12.5 mg. Pt gave a verbal understanding. While on the phone with pt she stated that her pharmacy called her and stated that her doctor declined one her medications. I verified that we had sent them all in and spoke with the pharmacy to verify that they had received them all. Called pt back to let her know that pharmacy said medication will be ready for pick up tomorrow after 11 am.

## 2024-04-10 NOTE — Progress Notes (Signed)
 "  Chief Complaint  Patient presents with   Medicare Wellness     Subjective:   Sharon Webster is a 79 y.o. female who presents for a Medicare Annual Wellness Visit.  Visit info / Clinical Intake: Medicare Wellness Visit Type:: Subsequent Annual Wellness Visit Persons participating in visit and providing information:: patient Medicare Wellness Visit Mode:: Telephone If telephone:: video declined Since this visit was completed virtually, some vitals may be partially provided or unavailable. Missing vitals are due to the limitations of the virtual format.: Documented vitals are patient reported If Telephone or Video please confirm:: I connected with patient using audio/video enable telemedicine. I verified patient identity with two identifiers, discussed telehealth limitations, and patient agreed to proceed. Patient Location:: Home Provider Location:: Office/Home Interpreter Needed?: No Pre-visit prep was completed: yes AWV questionnaire completed by patient prior to visit?: no Living arrangements:: (!) lives alone Patient's Overall Health Status Rating: (!) fair Typical amount of pain: none Does pain affect daily life?: no Are you currently prescribed opioids?: no  Dietary Habits and Nutritional Risks How many meals a day?: 2 Eats fruit and vegetables daily?: (!) no Most meals are obtained by: preparing own meals In the last 2 weeks, have you had any of the following?: none Diabetic:: no  Functional Status Activities of Daily Living (to include ambulation/medication): Independent Ambulation: Independent Medication Administration: Independent Home Management (perform basic housework or laundry): Independent Manage your own finances?: yes Primary transportation is: driving Concerns about vision?: (!) yes (problem driving at night) Concerns about hearing?: no  Fall Screening Falls in the past year?: 0 Number of falls in past year: 0 Was there an injury with Fall?:  0 Fall Risk Category Calculator: 0 Patient Fall Risk Level: Low Fall Risk  Fall Risk Patient at Risk for Falls Due to: No Fall Risks Fall risk Follow up: Falls evaluation completed; Falls prevention discussed  Home and Transportation Safety: All rugs have non-skid backing?: yes All stairs or steps have railings?: yes Grab bars in the bathtub or shower?: yes Have non-skid surface in bathtub or shower?: yes Good home lighting?: yes Regular seat belt use?: yes Hospital stays in the last year:: no  Cognitive Assessment Difficulty concentrating, remembering, or making decisions? : no Will 6CIT or Mini Cog be Completed: yes What year is it?: 0 points What month is it?: 0 points Give patient an address phrase to remember (5 components): [address] [place] About what time is it?: 0 points Count backwards from 20 to 1: 0 points Say the months of the year in reverse: 4 points Repeat the address phrase from earlier: 0 points 6 CIT Score: 4 points  Advance Directives (For Healthcare) Does Patient Have a Medical Advance Directive?: No Would patient like information on creating a medical advance directive?: -- (Patient will consider working on this)  Reviewed/Updated  Reviewed/Updated: Reviewed All (Medical, Surgical, Family, Medications, Allergies, Care Teams, Patient Goals)    Allergies (verified) Patient has no known allergies.   Current Medications (verified) Outpatient Encounter Medications as of 04/10/2024  Medication Sig   cholecalciferol (VITAMIN D3) 25 MCG (1000 UT) tablet Take 1,000 Units by mouth daily.   dicyclomine  (BENTYL ) 20 MG tablet TAKE 1 TABLET(20 MG) BY MOUTH THREE TIMES DAILY BEFORE MEALS   hydrochlorothiazide  (MICROZIDE ) 12.5 MG capsule TAKE 1 CAPSULE(12.5 MG) BY MOUTH DAILY   pantoprazole  (PROTONIX ) 40 MG tablet TAKE 1 TABLET(40 MG) BY MOUTH DAILY   polyethylene glycol (MIRALAX  / GLYCOLAX ) 17 g packet Take 17 g  by mouth daily. Take 1/2 teaspoon every  night   rosuvastatin  (CRESTOR ) 20 MG tablet Take 1 tablet (20 mg total) by mouth daily.   vitamin B-12 (CYANOCOBALAMIN ) 1000 MCG tablet Take 1,000 mcg by mouth daily.   vitamin E 1000 UNIT capsule Take 1,000 Units by mouth daily.   amLODipine  (NORVASC ) 5 MG tablet Take 1 tablet (5 mg total) by mouth daily. (Patient not taking: Reported on 04/10/2024)   doxycycline  (VIBRA -TABS) 100 MG tablet Take 1 tablet (100 mg total) by mouth 2 (two) times daily. With a meal (Patient not taking: Reported on 04/10/2024)   meloxicam  (MOBIC ) 15 MG tablet TAKE 1 TABLET(15 MG) BY MOUTH DAILY (Patient not taking: Reported on 04/10/2024)   No facility-administered encounter medications on file as of 04/10/2024.    History: Past Medical History:  Diagnosis Date   Bowel trouble    Breast screening, unspecified    atypical ductal hyperplasia, declined excision.   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 This was completed as her FNA showed atypia, papilloma, features of  ADH  identified. Declined formal excision. I elected not to offer chemoprevention without formal excision. .(January 13, 2012.)    COLONOSCOPY  08/20/2009   Tubular adenoma x2. Transverse and descending colon Dr. Christ   COLONOSCOPY WITH PROPOFOL  N/A 04/02/2015   Tubular adenoma. F/U 2022.  Surgeon: Reyes LELON Cota, MD;  Location: [facility] ENDOSCOPY;  Service: Endoscopy;  Laterality: N/A;   COLONOSCOPY WITH PROPOFOL  N/A  05/07/2020   Procedure: COLONOSCOPY WITH PROPOFOL ;  Surgeon: Cota Reyes LELON, MD;  Location: [facility] ENDOSCOPY;  Service: Endoscopy;  Laterality: N/A;   COLONOSCOPY WITH PROPOFOL  N/A 05/12/2023   Procedure: COLONOSCOPY WITH PROPOFOL ;  Surgeon: Unk Corinn Skiff, MD;  Location: [facility] ENDOSCOPY;  Service: Gastroenterology;  Laterality: N/A;   ESOPHAGOGASTRODUODENOSCOPY (EGD) WITH PROPOFOL  N/A 12/30/2021   Procedure: ESOPHAGOGASTRODUODENOSCOPY (EGD) WITH PROPOFOL ;  Surgeon: Cota Reyes LELON, MD;  Location: [facility] ENDOSCOPY;  Service: Endoscopy;  Laterality: N/A;   POLYPECTOMY  05/12/2023   Procedure: POLYPECTOMY;  Surgeon: Unk Corinn Skiff, MD;  Location: [facility] ENDOSCOPY;  Service: Gastroenterology;;   Family History  Problem Relation Age of Onset   Heart disease Mother    COPD Mother    Hypertension Mother    Cancer Father        metastatic prostate CA   Stroke Sister        hemorrhagic CVA   Heart disease Sister        restrictive cardiomyopathy   Aneurysm Brother    Social History   Occupational History   Not on file  Tobacco Use   Smoking status: Former    Current packs/day: 0.00    Average packs/day: 1 pack/day for 20.0 years (20.0 ttl pk-yrs)    Types: Cigarettes    Start date: 05/15/1981    Quit date: 05/15/2001    Years since quitting: 22.9   Smokeless tobacco: Never  Vaping Use   Vaping  status: Never Used  Substance and Sexual Activity   Alcohol use: Not Currently    Alcohol/week: 3.0 standard drinks of alcohol    Types: 3 Cans of beer per week   Drug use: No   Sexual activity: Not Currently   Tobacco Counseling Counseling given: Not Answered  SDOH Screenings   Food Insecurity: No Food Insecurity (04/10/2024)  Housing: Low Risk (04/10/2024)  Transportation Needs: No Transportation Needs (04/10/2024)  Utilities: Not At Risk (04/10/2024)  Alcohol Screen: Low Risk (04/10/2024)  Depression (PHQ2-9): Low Risk (04/10/2024)  Financial Resource Strain: Low Risk (04/10/2024)   Physical Activity: Inactive (04/10/2024)  Social Connections: Moderately Integrated (04/10/2024)  Stress: No Stress Concern Present (04/10/2024)  Tobacco Use: Medium Risk (04/10/2024)  Health Literacy: Adequate Health Literacy (04/10/2024)   See flowsheets for full screening details  Depression Screen PHQ 2 & 9 Depression Scale- Over the past 2 weeks, how often have you been bothered by any of the following problems? Little interest or pleasure in doing things: 0 Feeling down, depressed, or hopeless (PHQ Adolescent also includes...irritable): 0 PHQ-2 Total Score: 0 Trouble falling or staying asleep, or sleeping too much: 0 Feeling tired or having little energy: 0 Poor appetite or overeating (PHQ Adolescent also includes...weight loss): 0 Feeling bad about yourself - or that you are a failure or have let yourself or your family down: 0 Trouble concentrating on things, such as reading the newspaper or watching television (PHQ Adolescent also includes...like school work): 0 Moving or speaking so slowly that other people could have noticed. Or the opposite - being so fidgety or restless that you have been moving around a lot more than usual: 0 Thoughts that you would be better off dead, or of hurting yourself in some way: 0 PHQ-9 Total Score: 0 If you checked off any problems, how difficult have these problems made it for you to do your work, take care of things at home, or get along with other people?: Not difficult at all     Goals Addressed             This Visit's Progress    Patient Stated       Wants to the beach and fish             Objective:    Today's Vitals   04/10/24 1057 04/10/24 1250  BP: (!) 154/77 (!) 150/80  Weight: 120 lb (54.4 kg)   Height: 4' 11 (1.499 m)    Body mass index is 24.24 kg/m.  Hearing/Vision screen Hearing Screening - Comments:: No issues Vision Screening - Comments:: Glasses, [employer], [place], up to date Immunizations and Health  Maintenance Health Maintenance  Topic Date Due   Zoster Vaccines- Shingrix (1 of 2) Never done   COVID-19 Vaccine (5 - 2025-26 season) 11/14/2023   Influenza Vaccine  06/12/2024 (Originally 10/14/2023)   Pneumococcal Vaccine: 50+ Years (2 of 2 - PPSV23, PCV20, or PCV21) 07/20/2024 (Originally 03/28/2015)   Mammogram  03/20/2025   Medicare Annual Wellness (AWV)  04/10/2025   Colonoscopy  05/11/2028   DTaP/Tdap/Td (2 - Td or Tdap) 08/11/2033   Bone Density Scan  Completed   Hepatitis C Screening  Completed   Meningococcal B Vaccine  Aged Out   Hepatitis B Vaccines 19-59 Average Risk  Discontinued        Assessment/Plan:  This is a routine wellness examination for Dixon.  Patient Care Team: Marylynn Verneita CROME, MD as PCP - General (Internal Medicine) Marylynn Verneita CROME, MD (  Internal Medicine) Unk Corinn Skiff, MD as Consulting Physician (Gastroenterology)  I have personally reviewed and noted the following in the patients chart:   Medical and social history Use of alcohol, tobacco or illicit drugs  Current medications and supplements including opioid prescriptions. Functional ability and status Nutritional status Physical activity Advanced directives List of other physicians Hospitalizations, surgeries, and ER visits in previous 12 months Vitals Screenings to include cognitive, depression, and falls Referrals and appointments  No orders of the defined types were placed in this encounter.  In addition, I have reviewed and discussed with patient certain preventive protocols, quality metrics, and best practice recommendations. A written personalized care plan for preventive services as well as general preventive health recommendations were provided to patient.   Angeline Fredericks, LPN   8/72/7973   Return in 1 year (on 04/10/2025).  After Visit Summary: (Pick Up) Due to this being a telephonic visit, with patients personalized plan was offered to patient and patient has requested  to Pick up at office.  Nurse Notes: Patient declines covid and shingles vaccines. Phone note sent to PCP regarding blood pressure. "

## 2024-04-20 ENCOUNTER — Other Ambulatory Visit: Payer: Self-pay

## 2024-04-20 MED ORDER — PANTOPRAZOLE SODIUM 40 MG PO TBEC: DELAYED_RELEASE_TABLET | ORAL | 1 refills | Status: AC

## 2024-07-24 ENCOUNTER — Ambulatory Visit: Admitting: Internal Medicine

## 2025-04-15 ENCOUNTER — Ambulatory Visit
